# Patient Record
Sex: Female | Born: 1970 | Race: White | Hispanic: Yes | Marital: Married | Smoking: Never smoker
Health system: Southern US, Community
[De-identification: ages and names within clinical notes are randomized; demographics above are authoritative.]

## PROBLEM LIST (undated history)

## (undated) DIAGNOSIS — J349 Unspecified disorder of nose and nasal sinuses: Secondary | ICD-10-CM

## (undated) DIAGNOSIS — Z87440 Personal history of urinary (tract) infections: Secondary | ICD-10-CM

## (undated) DIAGNOSIS — F419 Anxiety disorder, unspecified: Secondary | ICD-10-CM

## (undated) DIAGNOSIS — R002 Palpitations: Secondary | ICD-10-CM

## (undated) DIAGNOSIS — E785 Hyperlipidemia, unspecified: Secondary | ICD-10-CM

## (undated) HISTORY — DX: Anxiety disorder, unspecified: F41.9

## (undated) HISTORY — PX: COLONOSCOPY: SHX174

## (undated) HISTORY — DX: Palpitations: R00.2

## (undated) HISTORY — DX: Unspecified disorder of nose and nasal sinuses: J34.9

## (undated) HISTORY — PX: SEPTOPLASTY: SUR1290

## (undated) HISTORY — DX: Personal history of urinary (tract) infections: Z87.440

## (undated) HISTORY — DX: Hyperlipidemia, unspecified: E78.5

---

## 2004-03-07 ENCOUNTER — Encounter: Payer: Self-pay | Admitting: Family Medicine

## 2005-02-27 ENCOUNTER — Ambulatory Visit: Payer: Self-pay | Admitting: Family Medicine

## 2005-04-10 ENCOUNTER — Ambulatory Visit: Payer: Self-pay | Admitting: Family Medicine

## 2005-04-24 ENCOUNTER — Ambulatory Visit: Payer: Self-pay | Admitting: Family Medicine

## 2006-03-13 DIAGNOSIS — D509 Iron deficiency anemia, unspecified: Secondary | ICD-10-CM | POA: Insufficient documentation

## 2006-04-19 ENCOUNTER — Telehealth: Payer: Self-pay | Admitting: Family Medicine

## 2006-04-19 ENCOUNTER — Ambulatory Visit: Payer: Self-pay | Admitting: Family Medicine

## 2006-04-20 ENCOUNTER — Telehealth (INDEPENDENT_AMBULATORY_CARE_PROVIDER_SITE_OTHER): Payer: Self-pay | Admitting: *Deleted

## 2006-05-03 ENCOUNTER — Encounter: Payer: Self-pay | Admitting: Family Medicine

## 2006-05-15 ENCOUNTER — Ambulatory Visit: Payer: Self-pay | Admitting: Family Medicine

## 2006-05-28 ENCOUNTER — Ambulatory Visit: Payer: Self-pay | Admitting: Family Medicine

## 2006-06-18 ENCOUNTER — Ambulatory Visit: Payer: Self-pay | Admitting: Family Medicine

## 2006-06-18 DIAGNOSIS — J029 Acute pharyngitis, unspecified: Secondary | ICD-10-CM | POA: Insufficient documentation

## 2006-06-18 DIAGNOSIS — H9209 Otalgia, unspecified ear: Secondary | ICD-10-CM | POA: Insufficient documentation

## 2008-09-18 ENCOUNTER — Ambulatory Visit (HOSPITAL_COMMUNITY): Payer: Self-pay | Admitting: Psychiatry

## 2008-10-01 ENCOUNTER — Ambulatory Visit (HOSPITAL_COMMUNITY): Payer: Self-pay | Admitting: Psychiatry

## 2008-10-19 ENCOUNTER — Ambulatory Visit (HOSPITAL_COMMUNITY): Payer: Self-pay | Admitting: Psychiatry

## 2008-10-30 ENCOUNTER — Ambulatory Visit (HOSPITAL_COMMUNITY): Payer: Self-pay | Admitting: Psychiatry

## 2008-11-26 ENCOUNTER — Ambulatory Visit (HOSPITAL_COMMUNITY): Payer: Self-pay | Admitting: Psychiatry

## 2008-12-11 ENCOUNTER — Ambulatory Visit (HOSPITAL_COMMUNITY): Payer: Self-pay | Admitting: Psychiatry

## 2009-01-11 ENCOUNTER — Ambulatory Visit (HOSPITAL_COMMUNITY): Payer: Self-pay | Admitting: Psychiatry

## 2009-01-20 ENCOUNTER — Ambulatory Visit (HOSPITAL_COMMUNITY): Payer: Self-pay | Admitting: Psychiatry

## 2009-01-20 ENCOUNTER — Ambulatory Visit: Payer: Self-pay | Admitting: Family Medicine

## 2009-02-11 ENCOUNTER — Ambulatory Visit (HOSPITAL_COMMUNITY): Payer: Self-pay | Admitting: Psychiatry

## 2009-02-26 ENCOUNTER — Ambulatory Visit (HOSPITAL_COMMUNITY): Payer: Self-pay | Admitting: Psychiatry

## 2011-08-11 DIAGNOSIS — M25559 Pain in unspecified hip: Secondary | ICD-10-CM | POA: Insufficient documentation

## 2011-12-22 ENCOUNTER — Ambulatory Visit (INDEPENDENT_AMBULATORY_CARE_PROVIDER_SITE_OTHER): Payer: 59 | Admitting: Licensed Clinical Social Worker

## 2011-12-22 DIAGNOSIS — F411 Generalized anxiety disorder: Secondary | ICD-10-CM

## 2011-12-22 DIAGNOSIS — F419 Anxiety disorder, unspecified: Secondary | ICD-10-CM

## 2011-12-24 ENCOUNTER — Encounter (HOSPITAL_COMMUNITY): Payer: Self-pay | Admitting: Licensed Clinical Social Worker

## 2011-12-24 DIAGNOSIS — F411 Generalized anxiety disorder: Secondary | ICD-10-CM | POA: Insufficient documentation

## 2011-12-24 NOTE — Progress Notes (Signed)
Presenting Problem Chief Complaint: Patty [name] came in after three years because of anxiety and unwarranted jealousy with her husband.  She has been taking Zoloft since 2010 and she has not had the anxiety that she used to have.  She slowly weaned herself off the medication in January and now the jealousy is coming back. She does not want to see herself as having to need medication.  Her husband has to take medication but that "is different"  Discussed how anxiety may not always be from situational problems - it is a biological problem and the biology is helped with medication.  She agreed to see the psychiatrist here and restart her medication.    She has a history that feeds into this way of her anxiety to be played out.  Her father had affairs and her mother chose not to pay attention.  It was Latice who was confronting him like a wife.  She was angry with her mother for not doing anything about it and angry with her father for doing it.  Discussed the issues to do with her culture.  It was more acceptable for men to have affairs - there was also more intensity and jealousy expressed in romantic relationships  She agreed with those two things.  So what she has learned can be unlearned.  She is very upset about it for her husband loves her but he is getting tired of the checking and accusations.  He is beginning to feel the need for space.  He is not in therapy now as he was three years ago - he has been doing well.  Did not ask about the couples therapy but they are not going at this time.  She learned more about having leisure time and they are both into exercise - some together and some apart.  She has a running group and he has a cycling group.  They have moved to [place] since we met before and they like it there.  It is closer to work and the [employer] school which is a Psychologist, educational school and she is happy with her children being there.  She has flexibility in her job so she can work at home so she also can  visit the school a lot too.  She is tired of her irrationality and all of her thoughts.  Discussed keeping a journal and writing them down instead of discussing with her husband.  She does not want to ruin their relationship for they are best friends and talk about everything.  She is happy with her marriage and does not like checking his phone and reading the wrong things into everything.  She has not cause to be concerned - when she was in treatment three years ago there was an incident where her husband kissed another woman but she did not mean anything to him and nothing was started.    She is wondering about her self esteem that she does not have confidence that she could be the only one for him.  One woman was not enough for her father.  Is she transferring her feelings from her father to her husband?  She is trying to vent to her brother instead of talking to him about it for now.  She hates the feeling of doubting all of the time.   What are the main stressors in your life right now, how long? Anxiety   1, Mood Swings  1 and Irritability   1   Previous mental  health services Have you ever been treated for a mental health problem, when, where, by whom? Yes  Three years ago with this same counselor Also when she left individual counseling, she was in marital therapy  Are you currently seeing a therapist or counselor, counselor's name? No   Have you ever had a mental health hospitalization, how many times, length of stay? No   Have you ever been treated with medication, name, reason, response? Yes  She recently took herself off of Zoloft - she was taking 100 mg  Have you ever had suicidal thoughts or attempted suicide, when, how? No   Risk factors for Suicide Demographic factors:   Current mental status:  Loss factors:  Historical factors:  Risk Reduction factors: Responsible for children under 74 years of age, Sense of responsibility to family, Religious beliefs about death, Employed,  Living with another person, especially a relative, Positive social support, Positive therapeutic relationship and Positive coping skills or problem solving skills Clinical factors:   Cognitive features that contribute to risk:    SUICIDE RISK:  Minimal: No identifiable suicidal ideation.  Patients presenting with no risk factors but with morbid ruminations; may be classified as minimal risk based on the severity of the depressive symptoms  Medical history Medical treatment and/or problems, explain: No  Do you have any issues with chronic pain?  No  Name of primary care physician/last physical exam: ?  Allergies: No Medication, reactions?    Current medications: None Prescribed by: ? Is there any history of mental health problems or substance abuse in your family, whom? No  Has anyone in your family been hospitalized, who, where, length of stay? No   Social/family history Have you been married, how many times?  Only once - approximately married 9/10 years  Do you have children?  She has two children - Sophia age 42 and [place] age 42  How many pregnancies have you had?  two  Who lives in your current household? Husband and two children  Military history: No   Religious/spiritual involvement:  What religion/faith base are you? Catholic  Family of origin (childhood history)  Where were you born? [place] Where did you grow up? [place] How many different homes have you lived?  Describe the atmosphere of the household where you grew up:  Do you have siblings, step/half siblings, list names, relation, sex, age? Yes  One brother - younger who is an Art gallery manager  Are your parents separated/divorced, when and why? No   Are your parents alive? Yes  They live in [place]  Social supports (personal and professional): Husband, friends and family but they are not local  Her family live in [place] and her husband's parents are both dead and any other family live in the [place]  area/  Education How many grades have you completed? post college graduate work or degree Did you have any problems in school, what type? No  Medications prescribed for these problems? 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 No   Have you ever been exposed to something traumatic, describe? No   Substance use Do you use Caffeine? Yes Type, frequency? coffee  Do you use Nicotine? No Type, frequency, ppd?    Do you use Alcohol? Yes  Type, frequency? Socially - beer/wine  How old were you went you first tasted alcohol? ? Was this accepted by your family?   When was your last drink, type, how much?   Have you ever used illicit drugs or taken more than prescribed, type, frequency, date of last usage? No   Mental Status: General Appearance Luretha Murphy:  Neat Eye Contact:  Good Motor Behavior:  Normal Speech:  Normal, Pressured and Rate:fast - has a mild [place] Rican accent Level of Consciousness:  Alert Mood:  Anxious and Euthymic Affect:  Appropriate Anxiety Level:  Minimal Thought Process:  Coherent, Relevant and Intact Thought Content:  WNL Perception:  Normal Judgment:  Good Insight:  Present Cognition:  Concentration Yes  Diagnosis AXIS I Anxiety Disorder NOS  AXIS II Deferred  AXIS III Past Medical History  Diagnosis Date  . Anxiety     AXIS IV problems with primary support group  AXIS V 61-70 mild symptoms   Plan: Referred to psychiatrist for medication, meet for six weeks on a weekly basis and develop treatment plan  _________________________________________           Merlene Morse, LCSW / Date  12/22/11

## 2011-12-25 ENCOUNTER — Ambulatory Visit (INDEPENDENT_AMBULATORY_CARE_PROVIDER_SITE_OTHER): Payer: 59 | Admitting: Psychiatry

## 2011-12-25 ENCOUNTER — Encounter (HOSPITAL_COMMUNITY): Payer: Self-pay | Admitting: Psychiatry

## 2011-12-25 VITALS — BP 115/67 | HR 60 | Ht 66.5 in | Wt 126.0 lb

## 2011-12-25 DIAGNOSIS — F411 Generalized anxiety disorder: Secondary | ICD-10-CM

## 2011-12-25 MED ORDER — SERTRALINE HCL 50 MG PO TABS: 50.0000 mg | ORAL_TABLET | Freq: Every day | ORAL | Status: DC

## 2011-12-25 NOTE — Progress Notes (Signed)
Psychiatric Assessment Adult  Patient Identification:  Patty [name] Date of Evaluation:  12/25/2011 Chief Complaint: Chief Complaint  Patient presents with  . Anxiety  . Depression   History of Chief Complaint:   HPI Comments: Ms. Hughley  is a 41  Y/o female with a past psychiatric history significant for Adjustment disorder with mixed anxiety and depression. The patient is referred for psychiatric services for psychiatric evaluation and medication.   The patient reports that her main stressors are: her irrational thoughts which lead to jealousy and loss of self-esteem.    In the area of affective symptoms, patient appears euthymic. Patient denies current suicidal ideation, intent, or plan. Patient denies current homicidal ideation, intent, or plan. Patient denies auditory hallucinations. Patient denies visual hallucinations. Patient denies symptoms of paranoia. Patient states sleep is poor, with approximately 8 hours of broken sleep per night. Appetite is good. Energy level is good. Patient denies symptoms of anhedonia. Patient endorses/denies hopelessness, helplessness, or guilt.   Denies any recent episodes consistent with mania, particularly decreased need for sleep with increased energy, grandiosity, impulsivity, hyperverbal and pressured speech, or increased productivity. Denies any recent symptoms consistent with psychosis, particularly auditory or visual hallucinations, thought broadcasting/insertion/withdrawal, or ideas of reference. Also denies excessive worry to the point of physical symptoms as well as any panic attacks. Denies any history of trauma or symptoms consistent with PTSD such as flashbacks, nightmares, hypervigilance, feelings of numbness or inability to connect with others.   She reports that she has always having obsessive thoughts about her father cheating on her mother since she was about 43 years old.  She reports that she had another relationship in  college in which she had ended due to issues of mistrust. She reports that she has had the need to leave work for 6 months, after her husband cheated on her, then started sertraline and within 5 months, with medication and therapy she stopped having to check your phone.  She states that her husband has been becoming distant and the she had a resurfacing of feeling of mistrust to the point of having to leaving work once a week ago.  Review of Systems  Constitutional: Negative.   Respiratory: Negative.   Cardiovascular: Negative.   Gastrointestinal: Negative.     Filed Vitals:   12/25/11 1316  BP: 115/67  Pulse: 60  Height: 5' 6.5" (1.689 m)  Weight: 126 lb (57.153 kg)   Physical Exam  Vitals reviewed. Constitutional: She appears well-developed and well-nourished. No distress.  Skin: She is not diaphoretic.   Traumatic Brain Injury: No   Past Psychiatric History: Diagnosis: Adjustment disorder with mixed anxiety and depression  Hospitalizations: Patient denies.  Outpatient Care: Currently in therapy. First at age 73  Substance Abuse Care: Patient denies.  Self-Mutilation:Patient denies.  Suicidal Attempts: Patient denies.  Violent Behaviors: Patient denies.   Past Medical History:   Past Medical History  Diagnosis Date  . Anxiety    History of Loss of Consciousness:  No Seizure History:  No Cardiac History:  No Allergies:  No Known Allergies Current Medications:  No current outpatient prescriptions on file.   Previous Psychotropic Medications:  SUBSTANCE USE HISTORY:  Caffeine: Coffee 2 cups per day.  Nicotine: Patient denies.  Alcohol: Patient denies.  Illicit Drugs: Patient denies.   Blackouts:  No  Social History: Current Place of Residence: [place], [place] Place of Birth: [place], [place] Family Members: She lives with her husband an 2 daughters. Her younger brother and parents are  still living in [place] Marital Status:  Married Children: 2  Daughters:  Ages 8 and 5  Relationships: She reports that her family is her main source of emotional support. Education:  Graduate-Master's degree Educational Problems/Performance: Some test anxiety with.. Religious Beliefs/Practices: Catholic History of Abuse: none Occupational Experiences; Military History:  None. Legal History: none Hobbies/Interests: Running. Shopping.  Family History:  History reviewed. No pertinent family history.  Mental Status Examination/Evaluation: Objective:  Appearance: Casual  Eye Contact::  Good  Speech:  Clear and Coherent and Normal Rate  Volume:  Normal  Mood:  "okay-good"  Affect:  Appropriate, Congruent and Full Range  Thought Process:  Coherent, Goal Directed, Linear and Logical  Orientation:  Full  Thought Content:  WDL  Suicidal Thoughts:  No  Homicidal Thoughts:  No  Judgement:  Good  Insight:  Good  Psychomotor Activity:  Normal  Akathisia:  No  Handed:  Right  Memory: 3/3 Immediate; 2/3 Recent.  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 Anxiety      AXIS IV other psychosocial or environmental problems  AXIS V 51-60 moderate symptoms   Treatment Plan/Recommendations:  1. Affirm with the patient that the medications are taken as ordered. Patient expressed understanding of how their medications were to be used.  2. Continue the following psychiatric medications as written prior to this appointment with the following changes:  a) Sertraline 50 mg, Take one tablet to 2 weeks, then one-and half 75 mg for one  week, then increase to two tablets. The patient reports that she did best on 100 mg daily. 3. Therapy: brief supportive therapy provided. Continue current services.  4. Risks and benefits, side effects and alternatives discussed with patient, he/she was given an opportunity to  ask questions about his/her medication, illness, and treatment. All current psychiatric  medications have been reviewed and discussed with the patient and adjusted as clinically appropriate. The patient has been provided an accurate and updated list of the medications being now prescribed.  5. Patient told to call clinic if any problems occur. Patient advised to go to  ER  if s/he should develop SI/HI, side effects, or if symptoms worsen. Has crisis numbers to call if needed.   6. No labs warranted at this time.   7. The patient was encouraged to keep all PCP and specialty clinic appointments.  8. Patient was instructed to return to clinic in 1 month.  9. The patient was advised to call and cancel their mental health appointment within 24 hours of the appointment, if they are unable to keep the appointment.  10. The patient expressed understanding of the plan and agrees with the above.   Jacqulyn Cane, MD 7/22/20131:06 PM

## 2011-12-27 ENCOUNTER — Ambulatory Visit (INDEPENDENT_AMBULATORY_CARE_PROVIDER_SITE_OTHER): Payer: 59 | Admitting: Licensed Clinical Social Worker

## 2011-12-27 DIAGNOSIS — F411 Generalized anxiety disorder: Secondary | ICD-10-CM

## 2011-12-27 NOTE — Progress Notes (Signed)
   THERAPIST PROGRESS NOTE  Session Time: 8:00 - 8:50  Participation Level: Active  Behavioral Response: NeatAlertAnxious and Euthymic  Type of Therapy: Individual Therapy  Treatment Goals addressed: Anxiety  Interventions: Motivational Interviewing and Supportive  Summary: Patty [name] is a 41 y.o. female who presents with anxiety that manifests in unwarranted jealousy.  [name] reports that she saw Dr. Laury Deep and she is restarted on Zoloft starting at 50mg  going to 75 and the final dose with be 100mg .  They discussed how the medication works and he reminded her that the brain is an organ that sometimes needs treatment.  She has an ingrained feeling about taking medication and seeing a therapist from her culture.  She knows differently but those well taught myths are hard to totally get rid of.   Discussed her history with learning about jealousy in the family.  She was close to her grandmother and she was always yelling at her husband about other women - he ignored her and they were married for 40 years.  Her grandmother's history is that she was married to a man that she had one child with - she left him and met the father of [name] - they wanted to marry but her father did not like him so that relationship ended - then she went back to her first husband and had another child.  They ended up divorced and she married the man that she was with for 40 years and the only grandfather that [name] knew.  Her husband has 2 half siblings and no whole sibling and he does not know his father.  When [name] was about 60-55 years old she started giving her father a bad time about being with other women when he was not home.  There is no evidence of this.  They did go to parties but mother did not want to go - father took his children.  So she observed him dancing with other women having a good time.  Her parents never divorced and apparently mother trusted him or did not care.  [name] thought she was  taking care of her mother.  She had the feeling that she was controlling her father. She knows she was not..   Suicidal/Homicidal: Nowithout intent/plan  Plan: Return again in 1 weeks.  Diagnosis: Axis I: Anxiety Disorder NOS    Axis II: Deferred    [name],[name], LCSW 12/27/2011

## 2012-01-02 ENCOUNTER — Ambulatory Visit (INDEPENDENT_AMBULATORY_CARE_PROVIDER_SITE_OTHER): Payer: 59 | Admitting: Licensed Clinical Social Worker

## 2012-01-02 DIAGNOSIS — F411 Generalized anxiety disorder: Secondary | ICD-10-CM

## 2012-01-02 NOTE — Progress Notes (Signed)
   THERAPIST PROGRESS NOTE  Session Time: 8:05 - 8:55  Participation Level: Active  Behavioral Response: CasualAlertEuthymic  Type of Therapy: Individual Therapy  Treatment Goals addressed: Anxiety  Interventions: Motivational Interviewing and Supportive  Summary: RENISHA COCKRUM is a 41 y.o. female who presents with problems with anxiety.  Ereka reports that she is feeling a lot better.  She feels the medicine works.  She still struggles some with the idea that she has to take medication.  She is struggling with her knowledge versus her culture.  Her culture is judgmental about mental health issues and medication - there a lot of myths connected to it.  She does not want her family to know that she is back on medication.  Her father never did know - and of course he is probably the one who could use the medication.  He has anxiety he would never admit to.    Judye realizes she also learned the possessive jealous ways from her grandmother and her father for they were both possessive and controlling.  So she comes by it hoenstyl.  She had a great weekend with her husband and they had a good talk about the problem. She feels foolish but she has finally come to realize that she cannot control her husband.   All the checking and watching was not going to do anything but keep her upset and tension in her realtionship.  -   Suicidal/Homicidal: Nowithout intent/plan  Plan: Return again in 1 weeks.  Diagnosis: Axis I: Anxiety Disorder NOS    Axis II: Deferred    Kechia Yahnke,[name], LCSW 01/02/2012

## 2012-01-17 ENCOUNTER — Ambulatory Visit (HOSPITAL_COMMUNITY): Payer: Self-pay | Admitting: Licensed Clinical Social Worker

## 2012-01-26 ENCOUNTER — Ambulatory Visit (HOSPITAL_COMMUNITY): Payer: Self-pay | Admitting: Licensed Clinical Social Worker

## 2012-01-30 ENCOUNTER — Ambulatory Visit (HOSPITAL_COMMUNITY): Payer: Self-pay | Admitting: Licensed Clinical Social Worker

## 2012-01-31 ENCOUNTER — Ambulatory Visit (INDEPENDENT_AMBULATORY_CARE_PROVIDER_SITE_OTHER): Payer: 59 | Admitting: Psychiatry

## 2012-01-31 ENCOUNTER — Encounter (HOSPITAL_COMMUNITY): Payer: Self-pay | Admitting: Psychiatry

## 2012-01-31 VITALS — BP 121/64 | HR 55 | Ht 66.5 in | Wt 126.0 lb

## 2012-01-31 DIAGNOSIS — F411 Generalized anxiety disorder: Secondary | ICD-10-CM

## 2012-01-31 MED ORDER — SERTRALINE HCL 100 MG PO TABS: 100.0000 mg | ORAL_TABLET | Freq: Every day | ORAL | Status: DC

## 2012-01-31 NOTE — Progress Notes (Signed)
[facility]al [facility] Follow-up Outpatient Visit  Patty [name] [birth year]  Date: 01/31/2012  History of Chief Complaint:  HPI Comments: Ms. Harron is a 41 Y/o female with a past psychiatric history significant for Adjustment disorder with mixed anxiety and depression. The patient is referred for psychiatric services for medication evaluation.  She reports that she noticed an improvement with increase dosage of sertraline to 100 mg.   In the area of affective symptoms, patient appears euthymic. Patient denies current suicidal ideation, intent, or plan. Patient denies current homicidal ideation, intent, or plan. Patient denies auditory hallucinations. Patient denies visual hallucinations. Patient denies symptoms of paranoia. Patient states sleep is good, with approximately 8 hours. Appetite is good. Energy level is good. Patient denies symptoms of anhedonia. Patient denies hopelessness, helplessness, or guilt.   Denies any recent episodes consistent with mania, particularly decreased need for sleep with increased energy, grandiosity, impulsivity, hyperverbal and pressured speech, or increased productivity. Denies any recent symptoms consistent with psychosis, particularly auditory or visual hallucinations, thought broadcasting/insertion/withdrawal, or ideas of reference. Also denies excessive worry to the point of physical symptoms as well as any panic attacks. Denies any history of trauma or symptoms consistent with PTSD such as flashbacks, nightmares, hypervigilance, feelings of numbness or inability to connect with others.   Review of Systems  Constitutional: Negative.  Respiratory: Negative.  Cardiovascular: Negative.  Gastrointestinal: Negative.   Filed Vitals:   01/31/12 1309  BP: 121/64  Pulse: 55  Height: 5' 6.5" (1.689 m)  Weight: 126 lb (57.153 kg)    Physical Exam  Vitals reviewed.  Constitutional: She appears well-developed and well-nourished. No distress.    Skin: She is not diaphoretic.   Traumatic Brain Injury: No   Past Psychiatric History:  Reviewed Diagnosis: Adjustment disorder with mixed anxiety and depression   Hospitalizations: Patient denies.   Outpatient Care: Currently in therapy. First at age 61   Substance Abuse Care: Patient denies.   Self-Mutilation:Patient denies.   Suicidal Attempts: Patient denies.   Violent Behaviors: Patient denies.    Past Medical History:  Reviewed Past Medical History   Diagnosis  Date   .  Anxiety     History of Loss of Consciousness: No  Seizure History: No  Cardiac History: No  Allergies: No Known Allergies   Current Medications:  Current Outpatient Prescriptions on File Prior to Visit  Medication Sig Dispense Refill  . sertraline (ZOLOFT) 100 MG tablet Take 1 tablet (100 mg total) by mouth daily.  30 tablet  2    Previous Psychotropic Medications:  SUBSTANCE USE HISTORY:  Caffeine: Coffee 2 cups per day.  Nicotine: Patient denies.  Alcohol: Patient denies.  Illicit Drugs: Patient denies.   Blackouts: No   Social History:  Reviewed Current Place of Residence: [place], [place]  Place of Birth: [place], [place]  Family Members: She lives with her husband an 2 daughters. Her younger brother and parents are still living in [place]  Marital Status: Married  Children: 2  Daughters: Ages 8 and 5  Relationships: She reports that her family is her main source of emotional support.  Education: Graduate-Master's degree  Educational Problems/Performance: Some test anxiety with..  Religious Beliefs/Practices: Catholic  History of Abuse: none  Occupational Experiences;  Military History: None.  Legal History: none  Hobbies/Interests: Running. Shopping.   Family History: History reviewed. No pertinent family history.   Mental Status Examination/Evaluation:  Objective: Appearance: Casual   Eye Contact:: Good   Speech: Clear and Coherent and Normal  Rate   Volume: Normal   Mood:  "good"   Affect: Appropriate, Congruent and Full Range   Thought Process: Coherent, Goal Directed, Linear and Logical   Orientation: Full   Thought Content: WDL   Suicidal Thoughts: No   Homicidal Thoughts: No   Judgement: Good   Insight: Good   Psychomotor Activity: Normal   Akathisia: No   Handed: Right   Memory: 3/3 Immediate; 3/3 Recent.   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  Anxiety       AXIS IV  other psychosocial or environmental problems   AXIS V  51-60 moderate symptoms    Treatment Plan/Recommendations:  1. Affirm with the patient that the medications are taken as ordered. Patient expressed understanding of how their medications were to be used.  2. Continue the following psychiatric medications as written prior to this appointment with the following changes:  a) Continue Sertraline 100 mg  3. Therapy: brief supportive therapy provided. Continue current services.  4. Risks and benefits, side effects and alternatives discussed with patient, she was given an opportunity to ask questions about her medication, illness, and treatment. All current psychiatric  medications have been reviewed and discussed with the patient and adjusted as clinically appropriate. The patient has been provided an accurate and updated list of the medications being now prescribed.  5. Patient told to call clinic if any problems occur. Patient advised to go to ER if she should develop SI/HI, side effects, or if symptoms worsen. Has crisis  numbers to call if needed.  6. No labs warranted at this time.  7. The patient was encouraged to keep all PCP and specialty clinic appointments.  8. Patient was instructed to return to clinic in 2 months.  9. The patient was advised to call and cancel their mental health appointment within 24 hours of the appointment, if they are unable to keep the appointment.  10. The patient expressed understanding of the plan and agrees with the above.   Jacqulyn Cane, MD

## 2012-02-02 ENCOUNTER — Ambulatory Visit (HOSPITAL_COMMUNITY): Payer: Self-pay | Admitting: Licensed Clinical Social Worker

## 2012-02-06 ENCOUNTER — Ambulatory Visit: Payer: Self-pay | Admitting: Sports Medicine

## 2012-02-07 ENCOUNTER — Encounter: Payer: Self-pay | Admitting: Sports Medicine

## 2012-02-07 ENCOUNTER — Ambulatory Visit (INDEPENDENT_AMBULATORY_CARE_PROVIDER_SITE_OTHER): Payer: 59 | Admitting: Sports Medicine

## 2012-02-07 VITALS — BP 108/70 | Ht 67.0 in | Wt 121.0 lb

## 2012-02-07 DIAGNOSIS — M25569 Pain in unspecified knee: Secondary | ICD-10-CM

## 2012-02-07 DIAGNOSIS — M7630 Iliotibial band syndrome, unspecified leg: Secondary | ICD-10-CM

## 2012-02-07 DIAGNOSIS — M629 Disorder of muscle, unspecified: Secondary | ICD-10-CM

## 2012-02-07 MED ORDER — KETOPROFEN POWD: Status: DC

## 2012-02-07 NOTE — Progress Notes (Signed)
  Subjective:    Patient ID: Patty [name], female    DOB: 1970-08-25, 41 y.o.   MRN: [id number]  HPI  Patient is a 41 y/o female runner coming in for right lateral knee pain x 1 month. Patient states that her right lateral leg pain first started last march when she was doing a lot of spinning workouts. She felt pain and like something broke on the lateral aspect of her hip. This caused her to take 1.5 months off to rest. Patient has now returned to activity and is training for a 1/2 marathon in October. She has had the right lateral knee pain now for one month. Patient notes that the pain started when she increased her mileage and also changed her shoes. Now, she does fine at the beginning of her run but always has onset of sharp pain 4.5 miles into the run.  Pain is 7-8/10. She has tried 3 pairs of shoes, visiting a PT last week for stretching exercises, massage therapy, ibuprofen 800 mg tid, and using a foam roller. Patient has not seen any improvement in her symptoms.  Review of Systems     Objective:   Physical Exam  Gen: A&O, well appearing, well groomed HEENT: sclera nonicteric, conjunctive non injected Resp: regular respirations with no increased WOB MSK: right knee: mild tenderness to palpation over the lateral distal IT band near the joint line. No joint line tenderness or other TTP. Full knee extension and flexion. No effusion. Neg lachman's. Neg varus and valgus strain. Neg McMurrays. Hip: Full hip ROM. 4+/5 strength on hip abduction R>L.  Slightly decreased hip ROM on internal rotation of R compared to left. No TTP of the IT band as it runs along the thigh, also no proximal TTP at the hip. Gait: Patient noted to have slight medial deviation of the patella while walking and running. Also slight out toeing of right foot.     Assessment & Plan:  #1. IT Band Syndrome - likely due to combination of hip abductor weakness and and quadricep weakness leading to medial deviation of  patella.  - ketoprofen 20% compound gel to lateral knee tid  - arch supports with scaphoid pad  - hip abduction and quad strengthening exercise.  - IT stretch 30 sec x 5  - Continue ice after run  - Foam roller Patient is okay to continue training for her half marathon as symptoms allow but understands that she may have to alter her training plan somewhat based on her knee pain. If symptoms were to persist, we could consider a cortisone injection, but I would want her to avoid running for at least a week thereafter. Followup when necessary

## 2012-02-07 NOTE — Patient Instructions (Signed)
IT Band Syndrome  1. Apply ketoprofen compound 3 times per day 2. Use arch supports 3. Do stretching and strengthening exercises as directed. Hip abduction 3 x 15 each side and leg lifts as directed on sheet. 4. Do IT stretch 30 sec x 5 times 5. Continue foam roller 6. Followup one month if continued symptoms.

## 2012-02-08 ENCOUNTER — Ambulatory Visit (INDEPENDENT_AMBULATORY_CARE_PROVIDER_SITE_OTHER): Payer: 59 | Admitting: Licensed Clinical Social Worker

## 2012-02-08 DIAGNOSIS — F411 Generalized anxiety disorder: Secondary | ICD-10-CM

## 2012-02-08 NOTE — Progress Notes (Signed)
   THERAPIST PROGRESS NOTE  Session Time: 9:05 - 10:00  Participation Level: Active  Behavioral Response: CasualAlertDysphoric  Type of Therapy: Individual Therapy  Treatment Goals addressed: Anxiety and Coping  Interventions: Motivational Interviewing and Supportive  Summary: Patty [name] is a 41 y.o. female who presents with anxiety.  [name] was teary and upset today for her husband is tired of her jealousy - actually so is she.  She had taken girls to the beach because her husband went to a bachelor party out of town.  Before he got back she had this urge to check his e-mail.  She found a picture of a dish.  When he got home she asked him about it.  He told her that he and a girl at work were in line in [employer] and they were joking around at the food.  The picture was to prove he could cook better than the cafeteria food.  He did not tell her because he does not want to provoke a reaction from her.  There apparently another time when he was racing with his bike and a girl was also riding and they decided to race.  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  She feels that she is more upset that he lies by omission.  He is tired of her possessiveness - he feels that he does not know how to help her trust.  He also feels that men can have female friends.Marland Kitchen He is always flirtatious - which can be annoying.  She feels she can manage without him that she does not need him = commented that was an interesting statement.  Does he feel not important enough to you.  She may have kept herself somewhat distant because of what happened when he went to [place] where he did almost leave her for another woman.   The question was asked about whether they would consider couples therapy.  Apparently they did not go to therapist recommended to them from me - the ones that do [facility].  She stated that he had picked out the therapist and  she was not helpful.  She perked up at the idea of going to someone who does a different type of therapy. Discussed how they have been together for a long time and they have children - it would be worth it to do the work on their marriage - if they don't clearly understand the problems in their marriage they risk picking the same problem again. There does seem to be an issue with her husband since there is a need to get female attention. Which fits her issue with not trusting men with other women - this of course goes back to her father. -She is going to pursue the other therapy recommended. .  .   Suicidal/Homicidal: Nowithout intent/plan  Plan: Return again in 1 weeks.  Diagnosis: Axis I: Anxiety Disorder NOS    Axis II: Deferred    Hartley Wyke,[name], LCSW 02/08/2012

## 2012-02-09 ENCOUNTER — Ambulatory Visit (HOSPITAL_COMMUNITY): Payer: Self-pay | Admitting: Licensed Clinical Social Worker

## 2012-02-13 ENCOUNTER — Ambulatory Visit: Payer: Self-pay | Admitting: Sports Medicine

## 2012-02-15 ENCOUNTER — Ambulatory Visit (INDEPENDENT_AMBULATORY_CARE_PROVIDER_SITE_OTHER): Payer: 59 | Admitting: Licensed Clinical Social Worker

## 2012-02-15 DIAGNOSIS — F411 Generalized anxiety disorder: Secondary | ICD-10-CM

## 2012-02-15 NOTE — Progress Notes (Signed)
   THERAPIST PROGRESS NOTE  Session Time: 9:10 -:00  Participation Level: Active  Behavioral Response: CasualAlertEuthymic  Type of Therapy: Individual Therapy  Treatment Goals addressed: Anxiety  Interventions: Motivational Interviewing and Supportive  Summary: Patty [name] is [name] 41 y.o. female who presents with anxiety. Patty [name] repotted that they had gone to the [employer] therapist and liked her - husband liked her too. Brytnie has read some information about [facility] and she likes the theory behind it.  The therapist gave them some reading to do and one was the book I had leant to her.  She has hope that this therapy may help them.  The therapist asked how long  He had been unhappy and his answer was 5 years which concerns Patty [name]. They have had very different family histories.  Patty [name] was basically stable - some affair her father may have had but he was always involved with the children. Mother was [name] little more distant - involved with the church.  His history is [name] lot worse his mother was [name] Mudlogger and would not settle down and his father deserted.  He often did not have enough food.  She taught her kids how to steal.  What concerns her is his need to be flirtatious.  It is only inviting the wrong attention.  He has always been flirtations. He is more outgoing than she is...     Suicidal/Homicidal: Nowithout intent/plan  Plan: Return again in 1 weeks.  Diagnosis: Axis I: Anxiety Disorder NOS    Axis II: Deferred    Trigger Patty [name],Patty A, LCSW 02/15/2012

## 2012-02-20 ENCOUNTER — Ambulatory Visit (INDEPENDENT_AMBULATORY_CARE_PROVIDER_SITE_OTHER): Payer: 59 | Admitting: Sports Medicine

## 2012-02-20 ENCOUNTER — Encounter: Payer: Self-pay | Admitting: Sports Medicine

## 2012-02-20 VITALS — BP 107/68 | HR 67 | Ht 67.0 in | Wt 121.0 lb

## 2012-02-20 DIAGNOSIS — M629 Disorder of muscle, unspecified: Secondary | ICD-10-CM

## 2012-02-20 DIAGNOSIS — M7630 Iliotibial band syndrome, unspecified leg: Secondary | ICD-10-CM

## 2012-02-20 NOTE — Progress Notes (Signed)
  Subjective:    Patient ID: Patty [name], female    DOB: 08/30/1970, 41 y.o.   MRN: [id number]  HPI  Patient comes in today with persistent lateral right knee pain. She's currently training for half marathon in October. The topical ketoprofen is not helpful. She has been diligent about doing her hip abductor strengthening exercises. The green sports insoles and scaphoid pads are comfortable. She took a few days off from running and her pain did improve but return immediately with resumption of activity. She notes lateral right knee pain at around the 2 mile mark. She's able to run about 4 miles but has to stop due to her pain. No swelling. No mechanical symptoms.    Review of Systems     Objective:   Physical Exam Well-developed, well-nourished. No acute distress awake alert oriented x3.  Right knee: Full motion without an effusion. Discrete tenderness to palpation along the IT band over the lateral femoral condyle. No soft tissue swelling. No joint line tenderness. Negative McMurrays. Neurovascularly intact distally. Walking without a limp.       Assessment & Plan:  1. Persistent right knee pain secondary to distal IT band syndrome  We discussed the possibility of a cortisone injection but I informed the patient that she would need to avoid running for 10 days post injection. She was not interested in this. She would instead like to try some formal physical therapy for iontophoresis. She will continue with her hip abductor strengthening. She return to the office in 4 weeks for a reevaluation. We will plan on making her some custom orthotics at that visit. With her symptoms I do not believe she will be able to train appropriately for the upcoming half marathon in October. She is fine with modifying her running program and sticking with shorter runs of 3-4 miles and tell her knee pain improves. I explained to her the importance of hip abductor strengthening in the resolution of her pain  and prevention of recurrence. She understands.

## 2012-02-21 ENCOUNTER — Ambulatory Visit: Payer: Self-pay | Admitting: Sports Medicine

## 2012-02-22 ENCOUNTER — Ambulatory Visit (INDEPENDENT_AMBULATORY_CARE_PROVIDER_SITE_OTHER): Payer: 59 | Admitting: Licensed Clinical Social Worker

## 2012-02-22 DIAGNOSIS — F411 Generalized anxiety disorder: Secondary | ICD-10-CM

## 2012-02-25 NOTE — Progress Notes (Signed)
   THERAPIST PROGRESS NOTE  Session Time: 8:10 - 9:00  Participation Level: Active  Behavioral Response: CasualAlertEuthymic  Type of Therapy: Individual Therapy  Treatment Goals addressed: Anxiety  Interventions: Motivational Interviewing and Supportive  Summary: Patty [name] is a 41 y.o. female who presents with anxiety.   [name] was unhappy with herself because she had a slip with her questioning her husband.  It was an unfounded concern and her husband did say that he hated that behavior - it was the main thing that made him ever question anything about the marriage. She feels that out work together has helped her understand herself better.  She does nave th work to do to quit reacting..  She has set up a massage for her husband.  They have a vacation home and it is rented out until Nov.  She is going to miss going.  That is a place that they all can relax.  She has trouble relaxing at home.  The couple that her husband rides with ard becoming friends - the woman is excited that [name] is talking about going on a ride with her.  [name] knows that she is a better rider but it sounded like fun and she can get to know her. She and her husband went to a work party - celebration at her company.  She had a really good time - others were referring to she and her husband as honeymooners - the way they were together was nothing unusual for them.   Discussed how they have a lot of positive in their marriage.  Perhaps the new couples treatment will help them work out the main problem they have.- there is two sides to this issue - what is the reason that he needs to flirt.  Suicidal/Homicidal: Nowithout intent/plan  Plan: Return again in 1 weeks.  Diagnosis: Axis I: Anxiety Disorder NOS    Axis II: Deferred    Sameeha Rockefeller,[name], LCSW 02/25/2012

## 2012-02-29 ENCOUNTER — Ambulatory Visit (INDEPENDENT_AMBULATORY_CARE_PROVIDER_SITE_OTHER): Payer: 59 | Admitting: Licensed Clinical Social Worker

## 2012-02-29 DIAGNOSIS — F411 Generalized anxiety disorder: Secondary | ICD-10-CM

## 2012-02-29 NOTE — Progress Notes (Signed)
   THERAPIST PROGRESS NOTE  Session Time: 8:15 - 9:00  Participation Level: Active  Behavioral Response: NeatAlertDepressed  Type of Therapy: Individual Therapy  Treatment Goals addressed: Anxiety and Coping  Interventions: Motivational Interviewing and Supportive  Summary: Patty [name] is a 41 y.o. female who presents with anxiety.  Patty [name] reports that she is very upset about her problem of jealousy and possessiveness.  In exploring her problem it became obvious of how much [name] has trouble relaxing.  She has a hard time shutting down - even massage does not relax her.  She was sharing that she feels that the marital therapy is very intense and probably will be helpful.  There was an exercise that they were doing and she could not empty her mind and really hear what her husband was saying.  She was analyzing everything he said and did.  He was able to empty his mind - he is working with their therapy well.  The therapist  really pushed them to read the resources that she gave them..  She discussed her closeness to her father and how she tends to go to him with problems and not her mother. She does not talk about her mother and she says her memories of her are slim.  She is going to work on self talk and meditation.  Meditation will take awhile but she needs to make the time and effort available to her. She is aware that she and her husband are being good parents and both are trying to do better than their parents.  However, they do not get enough time together without children.  She is going to set that up better and leave the children with a babysitter..  Suicidal/Homicidal: Nowithout intent/plan  Plan: Return again in 1 weeks.  Diagnosis: Axis I: Anxiety Disorder NOS    Axis II: Deferred    Foch Rosenwald,[name], LCSW 02/29/2012

## 2012-03-06 ENCOUNTER — Ambulatory Visit (HOSPITAL_COMMUNITY): Payer: Self-pay | Admitting: Licensed Clinical Social Worker

## 2012-03-15 ENCOUNTER — Ambulatory Visit (INDEPENDENT_AMBULATORY_CARE_PROVIDER_SITE_OTHER): Payer: 59 | Admitting: Licensed Clinical Social Worker

## 2012-03-15 DIAGNOSIS — F411 Generalized anxiety disorder: Secondary | ICD-10-CM

## 2012-03-15 NOTE — Progress Notes (Signed)
   THERAPIST PROGRESS NOTE  Session Time: 8:00 - 8:50  Participation Level: Active  Behavioral Response: CasualAlertEuthymic  Type of Therapy: Individual Therapy  Treatment Goals addressed: Anxiety  Interventions: Motivational Interviewing and Supportive  Summary: Patty [name] is a 41 y.o. female who presents with anxiety.  [name] reporting that she went away to the beach for a business trip and she really got to relax. She found it to be a wonderful weekend.  She is realizing how having time to yourself is good and that is something her husband craves-  This from him has sometimes made her question how that is related to their marriage - she sees now it is a need they both have. He was pleased to hear that she was relaxed for he has complained about that with her.  She also realized a couple.needs to realized that they have different ways to express feelings and have different ways of feeling loved.  She sees her husband as cold and distant at times - pointed out to her that may be the way he gets space for himself.  He may be able to come toward her more if she can back up and not be so focussed on him.  If they are at a party she is busy watching him and what he does - inerpreting and questioning - and it actually bores her. Suggested she make it more interesting and pay attention to herself and get her needs met to meet new people, having different conversations.  They have a party this weekend, she is going to do that.  She never thought about that before.  She does the planning for the weekend because he is not a planner and he seems to enjoy what she plans.  She has gotten over her feelings of not wanting to leave the children. It helps that they have found a college student who they really like and trust.  The girls just love her.  That helps to have time to get away as a couple.   She is running daily and that really helps her feel good and keep her feelings in check.  She is also  riding a bike in between runs.  Her husband has his bike group that he really likes but now with weather changes they are not getting together as much.  He works out at [employer] and rides.  She is nervous about couple's therapy because she is having a hard time with the exercises.  They cancelled the one they had over the weekend.  Mainly because her husband was angry and did not want to go.  She is identifying the problem as hers and so is he - it will take awhile but his part will become more obvious.  Suicidal/Homicidal: Nowithout intent/plan  Plan: Return again in 2 weeks.  Diagnosis: Axis I: Generalized Anxiety Disorder    Axis II: Deferred    Klinton Candelas,[name], LCSW 03/15/2012

## 2012-03-19 ENCOUNTER — Ambulatory Visit (INDEPENDENT_AMBULATORY_CARE_PROVIDER_SITE_OTHER): Payer: 59 | Admitting: Sports Medicine

## 2012-03-19 VITALS — BP 107/70 | Ht 67.0 in | Wt 120.0 lb

## 2012-03-19 DIAGNOSIS — M7631 Iliotibial band syndrome, right leg: Secondary | ICD-10-CM

## 2012-03-19 DIAGNOSIS — M629 Disorder of muscle, unspecified: Secondary | ICD-10-CM

## 2012-03-19 NOTE — Progress Notes (Signed)
  Subjective:    Patient ID: Patty [name], female    DOB: 28-Aug-1970, 41 y.o.   MRN: [id number]  HPI Patient comes in today for orthotics and followup on right IT band syndrome. Right knee is feeling much better. She likes the body helix compression sleeve and is asking for a second one. She has been diligent about her home exercises, especially the hip abductor strengthening. She is down the green sports insoles and scaphoid pads to be quite helpful. She is back to running with minimal pain.   Review of Systems     Objective:   Physical Exam Well developed, well-nourished. No acute distress. Awake alert and oriented x3  Right knee shows full motion without effusion. No significant tennis along the distal IT band. Her hip abductor weakness has improved dramatically since her last office visit.      Assessment & Plan:  1. Improving right knee pain secondary to distal IT band syndrome  Patient is doing well. She understands the importance of continued hip abductor strengthening. We've given her a second body helix sleeve and we will make her some custom orthotics today. I'll see her back in 4 weeks.  Patient was fitted for a : standard, cushioned, semi-rigid orthotic. The orthotic was heated and afterward the patient stood on the orthotic blank positioned on the orthotic stand. The patient was positioned in subtalar neutral position and 10 degrees of ankle dorsiflexion in a weight bearing stance. After completion of molding, a stable base was applied to the orthotic blank. The blank was ground to a stable position for weight bearing. Size:9 Base: [employer] and Padding:none

## 2012-03-27 ENCOUNTER — Ambulatory Visit (HOSPITAL_COMMUNITY): Payer: Self-pay | Admitting: Licensed Clinical Social Worker

## 2012-04-02 ENCOUNTER — Ambulatory Visit (INDEPENDENT_AMBULATORY_CARE_PROVIDER_SITE_OTHER): Payer: 59 | Admitting: Psychiatry

## 2012-04-02 ENCOUNTER — Encounter (HOSPITAL_COMMUNITY): Payer: Self-pay | Admitting: Psychiatry

## 2012-04-02 VITALS — BP 121/62 | HR 63 | Ht 66.5 in | Wt 131.0 lb

## 2012-04-02 DIAGNOSIS — F411 Generalized anxiety disorder: Secondary | ICD-10-CM

## 2012-04-02 MED ORDER — SERTRALINE HCL 100 MG PO TABS: ORAL_TABLET | ORAL | Status: DC

## 2012-04-02 MED ORDER — SERTRALINE HCL 25 MG PO TABS: ORAL_TABLET | ORAL | Status: DC

## 2012-04-02 NOTE — Progress Notes (Signed)
[facility] Behavioral Health Follow-up Outpatient Visit  Patty [name] 07-16-1970  Date: 04/02/2012  History of Chief Complaint:  HPI Comments: Patty [name] is a 41 Y/o female with a past psychiatric history significant for Adjustment disorder with mixed anxiety and depression. The patient is referred for psychiatric services for medication evaluation.   The patient reports that she has triggers for OCD and has symptoms lasting for 3 days.  She states she will check her husbands phone for messages from other people. She continues to run and finds this an adequate coping mechanism for her obsessions.   In the area of affective symptoms, patient appears euthymic. Patient denies current suicidal ideation, intent, or plan. Patient denies current homicidal ideation, intent, or plan. Patient denies auditory hallucinations. Patient denies visual hallucinations. Patient denies symptoms of paranoia. Patient states sleep is good, with approximately 5.5  hours. Appetite is good. Energy level is good. Patient denies symptoms of anhedonia. Patient denies hopelessness, helplessness, or guilt.   Denies any recent episodes consistent with mania, particularly decreased need for sleep with increased energy, grandiosity, impulsivity, hyperverbal and pressured speech, or increased productivity. Denies any recent symptoms consistent with psychosis, particularly auditory or visual hallucinations, thought broadcasting/insertion/withdrawal, or ideas of reference. Also denies excessive worry to the point of physical symptoms as well as any panic attacks. Denies any history of trauma or symptoms consistent with PTSD such as flashbacks, nightmares, hypervigilance, feelings of numbness or inability to connect with others.   Review of Systems  Constitutional: Negative.  Respiratory: Negative.  Cardiovascular: Negative.  Gastrointestinal: Negative.   Filed Vitals:   04/02/12 1304  BP: 121/62  Pulse: 63  Height:  5' 6.5" (1.689 m)  Weight: 131 lb (59.421 kg)   Physical Exam  Vitals reviewed.  Constitutional: She appears well-developed and well-nourished. No distress.  Skin: She is not diaphoretic.   Traumatic Brain Injury: No  Past Psychiatric History: Reviewed  Diagnosis: Adjustment disorder with mixed anxiety and depression   Hospitalizations: Patient denies.   Outpatient Care: Currently in therapy. First at age 54   Substance Abuse Care: Patient denies.   Self-Mutilation:Patient denies.   Suicidal Attempts: Patient denies.   Violent Behaviors: Patient denies.    Past Medical History: Reviewed  Past Medical History   Diagnosis  Date   .  Anxiety     History of Loss of Consciousness: No  Seizure History: No  Cardiac History: No  Allergies: No Known Allergies   Current Medications:  Current Outpatient Prescriptions on File Prior to Visit  Medication Sig Dispense Refill  . Ketoprofen POWD Ketoprofen 20% gel aaa tid-qid  60 g  3  . sertraline (ZOLOFT) 100 MG tablet Take 1 tablet (100 mg total) by mouth daily.  30 tablet  2   Previous Psychotropic Medications:   SUBSTANCE USE HISTORY:  Caffeine: Coffee 2 cups per day.  Nicotine: Patient denies.  Alcohol: Patient denies.  Illicit Drugs: Patient denies.   Blackouts: No   Social History: Reviewed  Current Place of Residence: [place], [place]  Place of Birth: [place], [place]  Family Members: She lives with her husband an 2 daughters. Her younger brother and parents are still living in [place]  Marital Status: Married  Children: 2  Daughters: Ages 8 and 6  Relationships: She reports that her family is her main source of emotional support.  Education: Graduate-Master's degree  Educational Problems/Performance: Some test anxiety with..  Religious Beliefs/Practices: Catholic  History of Abuse: none  Occupational Experiences;  Hotel manager  History: None.  Legal History: none  Hobbies/Interests: Running. Shopping.   Family  History: History reviewed. No pertinent family history.   Mental Status Examination/Evaluation:  Objective: Appearance: Casual   Eye Contact:: Good   Speech: Clear and Coherent and Normal Rate   Volume: Normal   Mood: "okay"   Affect: Appropriate, Congruent and Full Range   Thought Process: Coherent, Goal Directed, Linear and Logical   Orientation: Full   Thought Content: Obsessions  Suicidal Thoughts: No   Homicidal Thoughts: No   Judgement: Good   Insight: Good   Psychomotor Activity: Normal   Akathisia: No   Handed: Right   Memory: 3/3 Immediate; 1/3 Recent.   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  Anxiety       AXIS IV  other psychosocial or environmental problems   AXIS V  51-60 moderate symptoms    Treatment Plan/Recommendations:  1. Affirm with the patient that the medications are taken as ordered. Patient expressed understanding of how their medications were to be used.  2. Continue the following psychiatric medications as written prior to this appointment with the following changes:  a) Increase Sertraline 125 mg  3. Therapy: brief supportive therapy provided. Continue current services.  4. Risks and benefits, side effects and alternatives discussed with patient, she was given an opportunity to ask questions about her medication, illness, and treatment. All current psychiatric  medications have been reviewed and discussed with the patient and adjusted as clinically appropriate. The patient has been provided  an accurate and updated list of the medications being now prescribed.  5. Patient told to call clinic if any problems occur. Patient advised to go to ER if she should develop SI/HI, side effects, or if symptoms worsen. Has crisis numbers to call if needed.  6. No labs warranted at this time.  7. The patient was encouraged to keep all PCP and specialty clinic appointments.  8. Patient was instructed to return to clinic in 2 months.  9. The patient was advised to call and cancel their mental health appointment within 24 hours of the appointment, if they are unable to keep the appointment.  10. The patient expressed understanding of the plan and agrees with the above.  Jacqulyn Cane, MD

## 2012-04-03 ENCOUNTER — Ambulatory Visit (HOSPITAL_COMMUNITY): Payer: Self-pay | Admitting: Licensed Clinical Social Worker

## 2012-04-10 ENCOUNTER — Ambulatory Visit (HOSPITAL_COMMUNITY): Payer: Self-pay | Admitting: Licensed Clinical Social Worker

## 2012-04-16 ENCOUNTER — Ambulatory Visit (HOSPITAL_COMMUNITY): Payer: Self-pay | Admitting: Licensed Clinical Social Worker

## 2012-04-17 ENCOUNTER — Ambulatory Visit (INDEPENDENT_AMBULATORY_CARE_PROVIDER_SITE_OTHER): Payer: 59 | Admitting: Sports Medicine

## 2012-04-17 ENCOUNTER — Encounter: Payer: Self-pay | Admitting: Sports Medicine

## 2012-04-17 VITALS — BP 113/71 | HR 55 | Ht 67.5 in | Wt 123.0 lb

## 2012-04-17 DIAGNOSIS — M7630 Iliotibial band syndrome, unspecified leg: Secondary | ICD-10-CM

## 2012-04-17 DIAGNOSIS — M629 Disorder of muscle, unspecified: Secondary | ICD-10-CM

## 2012-04-17 NOTE — Progress Notes (Signed)
  Subjective:    Patient ID: Patty [name], female    DOB: 02-Nov-1970, 41 y.o.   MRN: [id number]  HPI Patient comes in today for followup on right the IT band syndrome. She is doing very well. She is now training for half marathon on December 7. She loves her new orthotics. She has been very diligent about working on her hip abduction strength. Still using a body helix knee sleeve. She is without complaint today.    Review of Systems     Objective:   Physical Exam Well-developed, well-nourished. No acute distress  Right knee shows full range of motion with no effusion. No tenderness to palpation along the distal IT band. 5/5 strength with resisted hip abduction. She walks without a limp.       Assessment & Plan:  1. Resolved right knee IT band syndrome  Patient can continue training for her half marathon and continue to increase her running as tolerated. She understands the importance of continuing with her hip abductor strengthening. She will continue with her orthotics. Followup when necessary.

## 2012-04-24 ENCOUNTER — Telehealth (HOSPITAL_COMMUNITY): Payer: Self-pay

## 2012-04-24 ENCOUNTER — Ambulatory Visit (INDEPENDENT_AMBULATORY_CARE_PROVIDER_SITE_OTHER): Payer: 59 | Admitting: Licensed Clinical Social Worker

## 2012-04-24 DIAGNOSIS — F411 Generalized anxiety disorder: Secondary | ICD-10-CM

## 2012-04-24 NOTE — Telephone Encounter (Signed)
Patient called Sertraline 25 mg was not filled with her 100 mg dosage.  Called the pharmacy they did not fill the medication but have the prescription on file.

## 2012-04-24 NOTE — Progress Notes (Signed)
   THERAPIST PROGRESS NOTE  Session Time: 8:00 - 8:50  Participation Level: Active  Behavioral Response: NeatAlertEuthymic  Type of Therapy: Individual Therapy  Treatment Goals addressed: Anxiety  Interventions: Motivational Interviewing and Supportive  Summary: Patty [name] is a 41 y.o. female who presents with anxiety.  Justice came in reporting that they were not going to [facility] Therapy appointments anymore for they felt it was not for them.  They were thinking of going to a regular couples therapist but they have decided that they are doing ok right now.  It is recognized that the jealousy is her problem and she feel she understands it pretty well . right now she is too busy to even think about it.  His brother has gone back out drinking alcoholically and he is separated from his wife and [place]  This is worrying him.. There is a lot of alcoholism in his family.  Fortunately he does not have that inclination.  She talked about how different they are in personality.  She is used to a family which is close and talk ablot while he used to being a alone and not talking .  He has to distance himself in a family get together at some point whereby no one in her family would think of distancing.  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  She is really into her running and training for some races.  He has his biking and she has her running.  She is getting more comfortable about him going out with some people from his work.  He feels good that she is trusting him.  He is very involved with their daughters also.  They both realize that they need to keep their [place] so they can get away more weekends for they really relax and let go there.. .   Suicidal/Homicidal: Nowithout intent/plan  Therapist Response: Pointed out to [name] that his style of distancing and not freely talking may contribute to her feelings of  mistrust.  She is used to openness and closeness so his behavior may feel threatening to her while her behavior feels suffocating to him..Supported her involvement with her children and taking care of herself.  Plan: Return again in 2 weeks.  Diagnosis: Axis I: Generalized Anxiety Disorder    Axis II: Deferred    Patty [name],[name], LCSW 04/24/2012

## 2012-05-07 ENCOUNTER — Ambulatory Visit (HOSPITAL_COMMUNITY): Payer: Self-pay | Admitting: Psychiatry

## 2012-05-15 ENCOUNTER — Ambulatory Visit (HOSPITAL_COMMUNITY): Payer: Self-pay | Admitting: Licensed Clinical Social Worker

## 2012-05-17 ENCOUNTER — Other Ambulatory Visit (HOSPITAL_COMMUNITY): Payer: Self-pay

## 2012-05-17 ENCOUNTER — Ambulatory Visit (HOSPITAL_COMMUNITY): Payer: Self-pay | Admitting: Psychiatry

## 2012-05-17 DIAGNOSIS — F411 Generalized anxiety disorder: Secondary | ICD-10-CM

## 2012-05-17 MED ORDER — SERTRALINE HCL 100 MG PO TABS: ORAL_TABLET | ORAL | Status: DC

## 2012-05-17 MED ORDER — SERTRALINE HCL 25 MG PO TABS: ORAL_TABLET | ORAL | Status: DC

## 2012-05-17 NOTE — Telephone Encounter (Signed)
E-scribed patient's medications; PLAN: Prescribed: 1. Sertraline 100 mg  2. Sertraline 25 mg

## 2012-06-06 ENCOUNTER — Ambulatory Visit (HOSPITAL_COMMUNITY): Payer: Self-pay | Admitting: Licensed Clinical Social Worker

## 2012-06-06 NOTE — Progress Notes (Unsigned)
   THERAPIST PROGRESS NOTE  Session Time: ***  Participation Level: {BHH PARTICIPATION LEVEL:22264}  Behavioral Response: {Appearance:22683}{BHH LEVEL OF CONSCIOUSNESS:22305}{BHH MOOD:22306}  Type of Therapy: {CHL AMB BH Type of Therapy:21022741}  Treatment Goals addressed: {CHL AMB BH Treatment Goals Addressed:21022754}  Interventions: {CHL AMB BH Type of Intervention:21022753}  Summary: Patty [name] is a 42 y.o. female who presents with ***.   Suicidal/Homicidal: {BHH YES OR NO:22294}{yes/no/with/without intent/plan:22693}  Therapist Response: ***  Plan: Return again in *** weeks.  Diagnosis: Axis I: {psych axis 1:31909}    Axis II: {psych axis 2:31910}

## 2012-06-07 ENCOUNTER — Ambulatory Visit (HOSPITAL_COMMUNITY): Payer: Self-pay | Admitting: Psychiatry

## 2012-06-11 ENCOUNTER — Encounter (HOSPITAL_COMMUNITY): Payer: Self-pay | Admitting: Licensed Clinical Social Worker

## 2012-06-19 ENCOUNTER — Ambulatory Visit (HOSPITAL_COMMUNITY): Payer: Self-pay | Admitting: Licensed Clinical Social Worker

## 2012-06-24 ENCOUNTER — Encounter (HOSPITAL_COMMUNITY): Payer: Self-pay | Admitting: Psychiatry

## 2012-06-24 ENCOUNTER — Ambulatory Visit (INDEPENDENT_AMBULATORY_CARE_PROVIDER_SITE_OTHER): Payer: 59 | Admitting: Psychiatry

## 2012-06-24 VITALS — BP 109/65 | HR 63 | Ht 67.0 in | Wt 131.0 lb

## 2012-06-24 DIAGNOSIS — F411 Generalized anxiety disorder: Secondary | ICD-10-CM

## 2012-06-24 MED ORDER — SERTRALINE HCL 100 MG PO TABS: ORAL_TABLET | ORAL | Status: DC

## 2012-06-24 NOTE — Progress Notes (Signed)
[facility] Behavioral Health Follow-up Outpatient Visit  Patty [name] [birth year]  Date: 06/24/2012  History of Chief Complaint:  HPI Comments: Ms. Mcnulty is a 42 Y/o female with a past psychiatric history significant for Adjustment disorder with mixed anxiety and depression. The patient is referred for psychiatric services for medication evaluation.   The patient reports that her relationship her husband has improved. She reports that she has decreased her sertraline dosage to 100 mg an feels better at this dosage.  She states her obsessive thoughts have decreased as well, as well as mistrust in her husband. She does endorse that she does not get enough sleep during the week, and feel much better when she is able to sleep at least 7 hours.   In the area of affective symptoms, patient appears euthymic. Patient denies current suicidal ideation, intent, or plan. Patient denies current homicidal ideation, intent, or plan. Patient denies auditory hallucinations. Patient denies visual hallucinations. Patient denies symptoms of paranoia. Patient states sleep is good, with approximately 5-7  hours. Appetite is good. Energy level is good. Patient denies symptoms of anhedonia. Patient denies hopelessness, helplessness, or guilt.   Denies any recent episodes consistent with mania, particularly decreased need for sleep with increased energy, grandiosity, impulsivity, hyperverbal and pressured speech, or increased productivity. Denies any recent symptoms consistent with psychosis, particularly auditory or visual hallucinations, thought broadcasting/insertion/withdrawal, or ideas of reference. Also denies excessive worry to the point of physical symptoms as well as any panic attacks. Denies any history of trauma or symptoms consistent with PTSD such as flashbacks, nightmares, hypervigilance, feelings of numbness or inability to connect with others.   Review of Systems  Constitutional: Negative.    Respiratory: Negative.  Cardiovascular: Negative.  Gastrointestinal: Negative.   Filed Vitals:   06/24/12 1134  BP: 109/65  Pulse: 63  Height: 5\' 7"  (1.702 m)  Weight: 131 lb (59.421 kg)   Physical Exam  Vitals reviewed.  Constitutional: She appears well-developed and well-nourished. No distress.  Skin: She is not diaphoretic.   Traumatic Brain Injury: No  Past Psychiatric History: Reviewed  Diagnosis: Adjustment disorder with mixed anxiety and depression   Hospitalizations: Patient denies.   Outpatient Care: Currently in therapy. First at age 64   Substance Abuse Care: Patient denies.   Self-Mutilation:Patient denies.   Suicidal Attempts: Patient denies.   Violent Behaviors: Patient denies.    Past Medical History: Reviewed  Past Medical History   Diagnosis  Date   .  Anxiety     History of Loss of Consciousness: No  Seizure History: No  Cardiac History: No  Allergies: No Known Allergies   Current Medications:  Current Outpatient Prescriptions on File Prior to Visit  Medication Sig Dispense Refill  . sertraline (ZOLOFT) 100 MG tablet Take 100 mg  30 tablet  1   Previous Psychotropic Medications:   SUBSTANCE USE HISTORY:  Caffeine: Coffee 2 cups per day.  Nicotine: Patient denies.  Alcohol: Patient denies.  Illicit Drugs: Patient denies.   Blackouts: No   Social History: Reviewed  Current Place of Residence: [place], [place]  Place of Birth: [place], [place]  Family Members: She lives with her husband an 2 daughters. Her younger brother and parents are still living in [place]  Marital Status: Married  Children: 2  Daughters: Ages 8 and 6  Relationships: She reports that her family is her main source of emotional support.  Education: Graduate-Master's degree  Educational Problems/Performance: Some test anxiety with..  Religious Beliefs/Practices: Catholic  History of Abuse: none  Occupational Experiences;  Military History: None.  Legal History: none   Hobbies/Interests: Running. Shopping.   Family History: History reviewed. No pertinent family history.   Mental Status Examination/Evaluation:  Objective: Appearance: Casual   Eye Contact:: Good   Speech: Clear and Coherent and Normal Rate   Volume: Normal   Mood: "okay" 6/10 (0=very depressed; 5 =neutral; 10=very happy)  Affect: Appropriate, Congruent and Full Range   Thought Process: Coherent, Goal Directed, Linear and Logical   Orientation: Full   Thought Content: Obsessions  Suicidal Thoughts: No   Homicidal Thoughts: No   Judgement: Good   Insight: Good   Psychomotor Activity: Normal   Akathisia: No   Handed: Right   Memory: 3/3 Immediate; 3/3 Recent.   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  Anxiety       AXIS IV  other psychosocial or environmental problems   AXIS V  51-60 moderate symptoms    Treatment Plan/Recommendations:  1. Affirm with the patient that the medications are taken as ordered. Patient expressed understanding of how their medications were to be used.  2. Continue the following psychiatric medications as written prior to this appointment with the following changes:  a) Decrease Sertraline 100 mg. Patient reports feeling better with this dosage.  3. Therapy: brief supportive therapy provided. Continue current services.  4. Risks and benefits, side effects and alternatives discussed with patient, she was given an opportunity to ask questions about her medication, illness, and treatment. All  current psychiatric  medications have been reviewed and discussed with the patient and adjusted as clinically appropriate. The patient has been provided an accurate and updated list of the medications being now prescribed.  5. Patient told to call clinic if any problems occur. Patient advised to go to ER if she should develop SI/HI, side effects, or if symptoms worsen. Has crisis numbers to call if needed.  6. No labs warranted at this time.  7. The patient was encouraged to keep all PCP and specialty clinic appointments.  8. Patient was instructed to return to clinic in 2 months.  9. The patient was advised to call and cancel their mental health appointment within 24 hours of the appointment, if they are unable to keep the appointment.  10. The patient expressed understanding of the plan and agrees with the above.  Jacqulyn Cane, MD

## 2012-09-23 ENCOUNTER — Ambulatory Visit (HOSPITAL_COMMUNITY): Payer: Self-pay | Admitting: Psychiatry

## 2012-09-26 ENCOUNTER — Ambulatory Visit (HOSPITAL_COMMUNITY): Payer: Self-pay | Admitting: Psychiatry

## 2012-09-27 ENCOUNTER — Encounter (HOSPITAL_COMMUNITY): Payer: Self-pay | Admitting: Psychiatry

## 2012-09-27 ENCOUNTER — Ambulatory Visit (INDEPENDENT_AMBULATORY_CARE_PROVIDER_SITE_OTHER): Payer: 59 | Admitting: Psychiatry

## 2012-09-27 VITALS — BP 110/68 | HR 73 | Ht 67.0 in | Wt 132.0 lb

## 2012-09-27 DIAGNOSIS — F429 Obsessive-compulsive disorder, unspecified: Secondary | ICD-10-CM

## 2012-09-27 DIAGNOSIS — F411 Generalized anxiety disorder: Secondary | ICD-10-CM

## 2012-09-27 MED ORDER — SERTRALINE HCL 100 MG PO TABS: ORAL_TABLET | ORAL | Status: DC

## 2012-09-27 NOTE — Progress Notes (Signed)
[facility] Behavioral Health Follow-up Outpatient Visit  Patty [name] January 23, 1971    Date: 09/27/2012  History of Chief Complaint:  HPI Comments: Patty [name] is a 42 y/o female with a past psychiatric history significant for Generalized Anxiety Disorder, Obsessive Compulsive Disorder. The patient is referred for psychiatric services for medication management.   She states she is doing well. Her relationship with her husband has improved. The patient reports that she has been running and sleeps very well on days that she runs. She reports that her obsessive compulsive symptoms are well controlled with sertraline at 100 mg.  She worries about being on this medication for the rest of her life but has had two previous trials of discontinuing sertraline with a return of OCD symptoms.She reports that she feels better when she is able to sleep without having to wake up early.  The patient reports she is taking her medications and denies any side effects.   In the area of affective symptoms, patient appears euthymic. Patient denies current suicidal ideation, intent, or plan. Patient denies current homicidal ideation, intent, or plan. Patient denies auditory hallucinations. Patient denies visual hallucinations. Patient denies symptoms of paranoia. Patient states sleep is fair, with approximately 7  hours. Appetite is good. Energy level is good. Patient denies symptoms of anhedonia. Patient denies hopelessness, helplessness, or guilt.   Denies any recent episodes consistent with mania, particularly decreased need for sleep with increased energy, grandiosity, impulsivity, hyperverbal and pressured speech, or increased productivity. Denies any recent symptoms consistent with psychosis, particularly auditory or visual hallucinations, thought broadcasting/insertion/withdrawal, or ideas of reference. Also denies excessive worry to the point of physical symptoms as well as any panic attacks. Denies any history  of trauma or symptoms consistent with PTSD such as flashbacks, nightmares, hypervigilance, feelings of numbness or inability to connect with others.   Review of Systems  Constitutional: Negative for fever, chills, weight loss and malaise/fatigue.  Respiratory: Negative for cough, sputum production, shortness of breath and wheezing.   Cardiovascular: Negative for chest pain, palpitations and leg swelling.  Gastrointestinal: Negative for heartburn, nausea, vomiting, abdominal pain, diarrhea and constipation.  Musculoskeletal:       Denies any muscle weakness or gait abnormalities.  Neurological: Negative for dizziness, tremors, seizures and loss of consciousness.    Filed Vitals:   09/27/12 1543  BP: 110/68  Pulse: 73  Height: 5\' 7"  (1.702 m)  Weight: 132 lb (59.875 kg)   Physical Exam  Vitals reviewed.  Constitutional: She appears well-developed and well-nourished. No distress.  Skin: She is not diaphoretic.   Traumatic Brain Injury: No   Past Psychiatric History: Reviewed  Diagnosis: Adjustment disorder with mixed anxiety and depression   Hospitalizations: Patient denies.   Outpatient Care: Currently in therapy. First at age 34   Substance Abuse Care: Patient denies.   Self-Mutilation:Patient denies.   Suicidal Attempts: Patient denies.   Violent Behaviors: Patient denies.    Past Medical History: Reviewed  Past Medical History   Diagnosis  Date   .  Anxiety     History of Loss of Consciousness: No  Seizure History: No  Cardiac History: No  Allergies: No Known Allergies   Current Medications:  Current Outpatient Prescriptions on File Prior to Visit  Medication Sig Dispense Refill  . fexofenadine-pseudoephedrine (ALLEGRA-D) 60-120 MG per tablet Take 1 tablet by mouth 2 (two) times daily.      . sertraline (ZOLOFT) 100 MG tablet Take 100 mg.  30 tablet  2   No  current facility-administered medications on file prior to visit.    Previous Psychotropic Medications:    SUBSTANCE USE HISTORY:  Caffeine: Coffee 2 cups per day.  Nicotine: Patient denies.  Alcohol: Patient denies.  Illicit Drugs: Patient denies.   Blackouts: No   Social History: Reviewed  Current Place of Residence: [place], [place]  Place of Birth: [place], [place]  Family Members: She lives with her husband an 2 daughters. Her younger brother and parents are still living in [place]  Marital Status: Married  Children: 2  Daughters: Ages 8 and 6  Relationships: She reports that her family is her main source of emotional support.  Education: Graduate-Master's degree  Educational Problems/Performance: Some test anxiety with..  Religious Beliefs/Practices: Catholic  History of Abuse: none  Occupational Experiences;  Military History: None.  Legal History: none  Hobbies/Interests: Running. Shopping.   Family History: History reviewed. No pertinent family history.   Psychiatric Specialty Examination:  Objective: Appearance: Casual   Eye Contact:: Good   Speech: Clear and Coherent and Normal Rate   Volume: Normal   Mood: "pretty good" 8/10 (0=very depressed; 5 =neutral; 10=very happy)  Affect: Appropriate, Congruent and Full Range   Thought Process: Coherent, Goal Directed, Linear and Logical   Orientation: Full   Thought Content: Obsessions  Suicidal Thoughts: No   Homicidal Thoughts: No   Judgement: Good   Insight: Good   Psychomotor Activity: Normal   Akathisia: No   Handed: Right   Memory: 3/3 Immediate; 3/3 Recent.   AIMS (if indicated): None   Assets: Communication Skills  Desire for Improvement  Financial Resources/Insurance  Housing  Intimacy  Leisure Time  Physical Health  Resilience  Social Support  Talents/Skills  Transportation  Vocational/Educational    Laboratory/X-Ray  Psychological Evaluation(s)   None  None    Assessment:  AXIS I  Generalized Anxiety Disorder, Obsessive Compulsive Disorder.  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  Anxiety       AXIS IV  other psychosocial or environmental problems   AXIS V  GAF: 70   Treatment Plan/Recommendations:  1. Affirm with the patient that the medications are taken as ordered. Patient expressed understanding of how their medications were to be used.  2. Continue the following psychiatric medications as written prior to this appointment with the following changes:  a) Will continue Sertraline 100 mg. Patient reports feeling better with this dosage.  3. Therapy: brief supportive therapy provided. Continue current services.  4. Risks and benefits, side effects and alternatives discussed with patient, she was given an opportunity to ask questions about her medication, illness, and treatment. All current psychiatric  medications have been reviewed and discussed with the patient and adjusted as clinically appropriate. The patient has been provided an accurate and updated list of the medications being now prescribed.  5. Patient told to call clinic if any problems occur. Patient advised to go to ER if she should develop SI/HI, side effects, or if symptoms worsen. Has crisis numbers to call if needed.  6. No labs warranted at this time.  7. The patient was encouraged to keep all PCP and specialty clinic appointments.  8. Patient was instructed to return to clinic in 4 months.  9. The patient was advised to call and cancel their mental health appointment within 24 hours of the appointment, if they are unable to  keep the appointment.  10. The patient expressed understanding of the plan and agrees with the above.  Jacqulyn Cane, M.D.  09/27/2012 3:41 PM

## 2013-01-07 ENCOUNTER — Ambulatory Visit (INDEPENDENT_AMBULATORY_CARE_PROVIDER_SITE_OTHER): Payer: 59 | Admitting: Sports Medicine

## 2013-01-07 VITALS — BP 116/74 | Ht 68.0 in | Wt 131.0 lb

## 2013-01-07 DIAGNOSIS — IMO0002 Reserved for concepts with insufficient information to code with codable children: Secondary | ICD-10-CM

## 2013-01-07 DIAGNOSIS — S39011A Strain of muscle, fascia and tendon of abdomen, initial encounter: Secondary | ICD-10-CM

## 2013-01-07 NOTE — Assessment & Plan Note (Addendum)
Exercises are - work up to Doing up to 3 sets of each spaced throughout the day: Broom Handle standing crunches Side Bridges - Build up to 1 minute on each side Front Planks/Bridges - Continue to do, working up to 1 minute at a time  Okay to keep running as long as no pain or pain less than 3/10.   Remember to try to keep your head straight when running - you have been leaning to the left Continue ice up to three times per day for 15-20 minutes at a time. Okay to use Aleve as needed  > If patient returns to care consider repeat ultrasound of the area, consider nitroglycerin patch to this area as there was some evidence of vascularity in the region.  Could also consider this being referred pain from an psoas contracture however given reproducibility of pain with recruitment of external abdominal oblique favor this diagnosis

## 2013-01-07 NOTE — Progress Notes (Signed)
  Sports Medicine Clinic  Patient name: ETHELREDA SUKHU MRN [id number]  Date of birth: Mar 23, 1971  CC & HPI:  Patty [name] is a 42 y.o. female who is returning to care.   Chief Complaint  Patient presents with  . Hip Pain    Right Hip Pain following running Description:  patient reports a right-sided hip pain that tends to bother her at night.  It is described as an ache.  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  Onset/Duration:  aproximally 4 weeks ago.  This is significant for listening injury but does report pain began the day following a significant abdominal work out.   Illicit ing Event/Injury:  No  Severity:  3/10 at rest  Aggravating:  going from sitting to standing, twisting   Effective Therapies:  ibuprofen and ice   Inneffective Therapies:    RED FLAGS & ROS:  no pain while laying on her right side, no pain radiating down her leg, no associated weakness, no significant trauma or falls        ROS:  See HPI  Pertinent History Reviewed:  Medical & Surgical Hx:  Reviewed: Significant for IT band syndrome, has been compliant with her hip abduction exercises, has been wearing her custom orthotics that were previously made here, no concerns or complaints with them Medications: Reviewed & Updated - see associated section Social History: Reviewed -  reports that she has never smoked. She has never used smokeless tobacco.   Objective Findings:  Vitals: BP 116/74  Ht 5\' 8"  (1.727 m)  Wt 131 lb (59.421 kg)  BMI 19.92 kg/m2  PE: GENERAL:  adult athletic female. In no discomfort; no respiratory distress  PSYCH:  alert and appropriate, good insight      NeuroMSK  hip Exam: Appearance:  normal appearing hip   Palpation:  there is no tenderness to palpation of the greater trochanter, gluteus medius/maximus, piriformis,  There is tenderness to palpation over the  superior aspect of the iliac crest located on the medial aspect , there is no appreciable tenderness palpation of the iliacus muscle   ROM: Active Passive  Full      Neurovascular:  lower extremity myotomes 5+/5   MSK Testing:   Bilateral negative log roll, Negative FABER and FADIR, negative greater trochanteric compression bilaterally, slight functional leg length inequality that corrects with sitting , Positive Thomas test on the right  Bilateral hip abduction is 5+/5 in slight flexion in neutral and slight extension, no reproducible pain with this  Prone hip extension and hamstring strengthen 5+/5 without any reproduction of pain Pain was able to be reproduced with cross body sit up.    MSK ultrasound The right iliac crest was investigated and did not show any evidence for muscle rupture or avulsion over the ASIS.  The area that was most tender to palpation did show a questionable hypoechoic change with slight increased vascularity on Doppler but was generally nondiagnostic    Assessment & Plan:  See problem associated charting

## 2013-01-07 NOTE — Patient Instructions (Addendum)
It was nice to see you today.   Today we discussed: Abdominal muscle strain Exercises are - work up to Doing up to 3 sets of each spaced throughout the day: Broom Handle standing crunches Side Bridges - Build up to 1 minute on each side Front Planks/Bridges - Continue to do, working up to 1 minute at a time  Okay to keep running as long as no pain or pain less than 3/10.   Remember to try to keep your head straight when running - you have been leaning to the left Continue ice up to three times per day for 15-20 minutes at a time. Okay to use Aleve as needed    Please plan to return in 4 weeks if not 100%.  If you need anything prior to seeing me please call the clinic.  Please Bring all medications with you to each appointment.

## 2013-01-10 ENCOUNTER — Other Ambulatory Visit: Payer: Self-pay | Admitting: *Deleted

## 2013-01-10 MED ORDER — MELOXICAM 15 MG PO TABS: 15.0000 mg | ORAL_TABLET | Freq: Every day | ORAL | Status: DC

## 2013-01-10 MED ORDER — MELOXICAM 15 MG PO TABS: ORAL_TABLET | ORAL | Status: DC

## 2013-01-15 ENCOUNTER — Ambulatory Visit: Payer: Self-pay | Admitting: Sports Medicine

## 2013-01-17 ENCOUNTER — Other Ambulatory Visit (HOSPITAL_COMMUNITY): Payer: Self-pay | Admitting: Chiropractic Medicine

## 2013-01-17 ENCOUNTER — Other Ambulatory Visit: Payer: Self-pay | Admitting: Chiropractic Medicine

## 2013-01-17 DIAGNOSIS — G8929 Other chronic pain: Secondary | ICD-10-CM

## 2013-01-17 DIAGNOSIS — M25551 Pain in right hip: Secondary | ICD-10-CM

## 2013-01-22 ENCOUNTER — Ambulatory Visit (HOSPITAL_COMMUNITY)
Admission: RE | Admit: 2013-01-22 | Discharge: 2013-01-22 | Disposition: A | Discharge status: Home / Self Care | Payer: 59 | Source: Ambulatory Visit | Attending: Chiropractic Medicine | Admitting: Chiropractic Medicine

## 2013-01-22 DIAGNOSIS — M25551 Pain in right hip: Secondary | ICD-10-CM

## 2013-01-22 DIAGNOSIS — M25559 Pain in unspecified hip: Secondary | ICD-10-CM | POA: Insufficient documentation

## 2013-01-22 DIAGNOSIS — G8929 Other chronic pain: Secondary | ICD-10-CM | POA: Insufficient documentation

## 2013-01-22 MED ORDER — IOHEXOL 180 MG/ML  SOLN
20.0000 mL | Freq: Once | INTRAMUSCULAR | Status: AC | PRN
  Administered 2013-01-22: 15 mL via INTRA_ARTICULAR

## 2013-01-22 MED ORDER — GADOBENATE DIMEGLUMINE 529 MG/ML IV SOLN
5.0000 mL | Freq: Once | INTRAVENOUS | Status: AC | PRN
  Administered 2013-01-22: 0.1 mL via INTRAVENOUS

## 2013-01-23 ENCOUNTER — Ambulatory Visit (INDEPENDENT_AMBULATORY_CARE_PROVIDER_SITE_OTHER): Payer: 59 | Admitting: Sports Medicine

## 2013-01-23 ENCOUNTER — Encounter: Payer: Self-pay | Admitting: Sports Medicine

## 2013-01-23 VITALS — BP 104/70 | Ht 67.5 in | Wt 130.0 lb

## 2013-01-23 DIAGNOSIS — S39011D Strain of muscle, fascia and tendon of abdomen, subsequent encounter: Secondary | ICD-10-CM

## 2013-01-23 DIAGNOSIS — M7630 Iliotibial band syndrome, unspecified leg: Secondary | ICD-10-CM | POA: Insufficient documentation

## 2013-01-23 DIAGNOSIS — G5701 Lesion of sciatic nerve, right lower limb: Secondary | ICD-10-CM | POA: Insufficient documentation

## 2013-01-23 DIAGNOSIS — Z5189 Encounter for other specified aftercare: Secondary | ICD-10-CM

## 2013-01-23 DIAGNOSIS — G5700 Lesion of sciatic nerve, unspecified lower limb: Secondary | ICD-10-CM

## 2013-01-23 DIAGNOSIS — M7631 Iliotibial band syndrome, right leg: Secondary | ICD-10-CM

## 2013-01-23 DIAGNOSIS — M629 Disorder of muscle, unspecified: Secondary | ICD-10-CM

## 2013-01-23 MED ORDER — DICLOFENAC SODIUM 75 MG PO TBEC: 75.0000 mg | DELAYED_RELEASE_TABLET | Freq: Two times a day (BID) | ORAL | Status: DC

## 2013-01-23 NOTE — Assessment & Plan Note (Signed)
Her examination today is consistent with piriformis muscle syndrome. Ultrasound revealed some mild edematous changes within the performance muscle belly. A prescription for diclofenac was ordered. She was given performance muscle stretches. She'll followup in 3 weeks for reevaluation. We've advised her that she may run when she is pain free but to advance running cautiously.

## 2013-01-23 NOTE — Progress Notes (Signed)
Patient ID: Patty [name], female   DOB: 1971-02-28, 42 y.o.   MRN: [id number] Is a 42 year old female who presents for followup of right hip pain. She seen in the sports medicine clinic with anterior hip and lower abdominal pain on August 5. She had been running fairly regularly at that time and was expressing pain the anterior abdominal wall extending down towards her hip. Since that time her pain in that region has somewhat improved and/or migrated to the posterior part of her hip. The pain originates in her buttock region. She notes the pain worse when  walking or running as she puts her foot puts weight onto that leg. She also notes significant discomfort when sitting for prolonged periods in the region of her right buttock.  She's been seeing a chiropractor for ART therapy and an MR arthrogram of the right hip was ordered from him. This was performed yesterday and the results were negative for labral pathology.    The patient is limited her ability to run. She has not run in 1 week. She's continued to do the abdominal strengthening exercises and stretches given to her at her prior appointment here.  She also complains of some mild pain on the lateral aspect of her lower thigh down towards her knee. She reports this is consistent with a prior episode of IT band syndrome. Is not limiting her ability to walk. She reports that that pain in her hip/buttock is much worse and more debilitating.  ROS: As per history of present illness otherwise negative  Examination: BP 104/70  Ht 5' 7.5" (1.715 m)  Wt 130 lb (58.968 kg)  BMI 20.05 kg/m2  LMP 12/29/2012 Is awake alert oriented 42 year old female in no acute distress  Right Hip: ROM IR: 80 Deg, ER: 45 Deg, Flexion: 120 Deg, Extension: 10 Deg, Abduction: 45 Deg, Adduction: 30 Deg Strength IR: 5/5, ER: 5/5, Flexion: 5/5, Extension: 5/5, Abduction: 5/5 (for gluteus medius, maximus and tensor fascia isolation), Adduction: 5/5 FABER negative,  FADIR positive (pain in posterior hip) Pelvic alignment unremarkable to inspection and palpation. Greater trochanter without tenderness to palpation. Significant tenderness noted over the piriformis muscle. No SI joint tenderness and normal minimal SI movement.  Gait is antalgic.  Musculoskeletal ultrasound of the right piriformis muscle was performed. Images obtained revealed evidence of edema within the muscle near its origin.  MRI: images were reviewed in the office with patient.

## 2013-01-23 NOTE — Assessment & Plan Note (Signed)
Today she has mild symptoms consistent with irritation of the IT band in addition to her gluteal discomfort. She's been given IT band stretches as well his performance muscle stretches. With appropriate stretching and resolution of her piriformis syndrome this should resolve.

## 2013-01-23 NOTE — Assessment & Plan Note (Signed)
At today's visit there is no evidence of abdominal muscle tenderness or pain.  Her symptoms appear today to be more posterior in the region of the piriformis muscle.

## 2013-01-27 ENCOUNTER — Encounter (HOSPITAL_COMMUNITY): Payer: Self-pay | Admitting: Psychiatry

## 2013-01-27 ENCOUNTER — Ambulatory Visit (INDEPENDENT_AMBULATORY_CARE_PROVIDER_SITE_OTHER): Payer: 59 | Admitting: Psychiatry

## 2013-01-27 VITALS — BP 107/61 | HR 58 | Ht 67.5 in | Wt 138.0 lb

## 2013-01-27 DIAGNOSIS — F411 Generalized anxiety disorder: Secondary | ICD-10-CM

## 2013-01-27 DIAGNOSIS — F429 Obsessive-compulsive disorder, unspecified: Secondary | ICD-10-CM

## 2013-01-27 MED ORDER — SERTRALINE HCL 100 MG PO TABS: ORAL_TABLET | ORAL | Status: DC

## 2013-01-27 MED ORDER — SERTRALINE HCL 25 MG PO TABS: 25.0000 mg | ORAL_TABLET | Freq: Every day | ORAL | Status: DC

## 2013-01-27 NOTE — Progress Notes (Addendum)
[facility] Behavioral Health Follow-up Outpatient Visit  Patty [name] 1970-07-18    Date: 01/27/2013  History of Chief Complaint:  HPI Comments: Patty [name] is a 42 y/o female with a past psychiatric history significant for Generalized Anxiety Disorder, Obsessive Compulsive Disorder. The patient is referred for psychiatric services for medication management.   The patient reports that she injured her in her hip, has had to stop running, and has had some worsening of her mood over the past three weeks.  She has found it difficult to adjust to not running daily. She has added stresses   She reports that her obsessive symptoms have worsened over the past 3 weeks, which correlates. The patient reports she is taking her medications and denies any side effects.   In the area of affective symptoms, patient appears euthymic. Patient denies current suicidal ideation, intent, or plan. Patient denies current homicidal ideation, intent, or plan. Patient denies auditory hallucinations. Patient denies visual hallucinations. Patient denies symptoms of paranoia. Patient states sleep is fair, with approximately 7 hours. Appetite is good. Energy level is good. Patient denies symptoms of anhedonia. Patient denies hopelessness, helplessness, or guilt.   Denies any recent episodes consistent with mania, particularly decreased need for sleep with increased energy, grandiosity, impulsivity, hyperverbal and pressured speech, or increased productivity. Denies any recent symptoms consistent with psychosis, particularly auditory or visual hallucinations, thought broadcasting/insertion/withdrawal, or ideas of reference. Also denies excessive worry to the point of physical symptoms as well as any panic attacks. Denies any history of trauma or symptoms consistent with PTSD such as flashbacks, nightmares, hypervigilance, feelings of numbness or inability to connect with others.   Review of Systems  Constitutional: Negative  for fever, chills, weight loss and malaise/fatigue.  Respiratory: Negative for cough, sputum production, shortness of breath and wheezing.   Cardiovascular: Negative for chest pain, palpitations and leg swelling.  Gastrointestinal: Negative for heartburn, nausea, vomiting, abdominal pain, diarrhea and constipation.  Musculoskeletal:       Reports myalgias on right hip and lower limbs.  Neurological: Negative for dizziness, tremors, seizures and loss of consciousness.    Filed Vitals:   01/27/13 1309  BP: 107/61  Pulse: 58  Height: 5' 7.5" (1.715 m)  Weight: 138 lb (62.596 kg)   Physical Exam  Vitals reviewed.  Constitutional: She appears well-developed and well-nourished. No distress.  Skin: She is not diaphoretic.  Musculoskeletal: Strength & Muscle Tone: within normal limits Gait & Station: normal Patient leans: Right-due to recent injury   Traumatic Brain Injury: No   Past Psychiatric History: Reviewed  Diagnosis: Adjustment disorder with mixed anxiety and depression   Hospitalizations: Patient denies.   Outpatient Care: Currently in therapy. First at age 9   Substance Abuse Care: Patient denies.   Self-Mutilation:Patient denies.   Suicidal Attempts: Patient denies.   Violent Behaviors: Patient denies.    Past Medical History: Reviewed  Past Medical History   Diagnosis  Date   .  Anxiety     History of Loss of Consciousness: No  Seizure History: No  Cardiac History: No  Allergies: No Known Allergies   Current Medications:  Current Outpatient Prescriptions on File Prior to Visit  Medication Sig Dispense Refill  . diclofenac (VOLTAREN) 75 MG EC tablet Take 1 tablet (75 mg total) by mouth 2 (two) times daily.  30 tablet  1  .  sertraline 100 mg  Take one tablet daliy    . fexofenadine-pseudoephedrine (ALLEGRA-D) 60-120 MG per tablet Take 1 tablet by mouth  2 (two) times daily.       No current facility-administered medications on file prior to visit.     Previous Psychotropic Medications:   SUBSTANCE USE HISTORY:  Caffeine: Coffee 2 cups per day.  Nicotine: Patient denies.  Alcohol: Patient denies.  Illicit Drugs: Patient denies.   Blackouts: No   Social History: Reviewed  Current Place of Residence: [place], [place]  Place of Birth: [place], [place]  Family Members: She lives with her husband an 2 daughters. Her younger brother and parents are still living in [place]  Marital Status: Married  Children: 2  Daughters: Ages 8 and 6  Relationships: She reports that her family is her main source of emotional support.  Education: Graduate-Master's degree  Educational Problems/Performance: Some test anxiety with..  Religious Beliefs/Practices: Catholic  History of Abuse: none  Occupational Experiences;  Military History: None.  Legal History: none  Hobbies/Interests: Running. Shopping.   Family History: History reviewed. No pertinent family history.   Psychiatric Specialty Examination:  Objective: Appearance: Casual   Eye Contact:: Good   Speech: Clear and Coherent and Normal Rate   Volume: Normal   Mood: "pretty good" 8/10 (0=very depressed; 5 =neutral; 10=very happy)  Affect: Appropriate, Congruent and Full Range   Thought Process: Coherent, Goal Directed, Linear and Logical   Orientation: Full   Thought Content: Obsessions  Suicidal Thoughts: No   Homicidal Thoughts: No   Judgement: Good   Insight: Good   Psychomotor Activity: Normal   Akathisia: No   Handed: Right   Memory: 3/3 Immediate; 3/3 Recent.   AIMS (if indicated): None   Assets: Communication Skills  Desire for Improvement  Financial Resources/Insurance  Housing  Intimacy  Leisure Time  Physical Health  Resilience  Social Support  Talents/Skills  Transportation  Vocational/Educational    Laboratory/X-Ray  Psychological Evaluation(s)   None  None    Assessment:  AXIS I  Generalized Anxiety Disorder, Obsessive Compulsive Disorder.  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 Affirm with the patient that the medications are taken as ordered. Patient expressed understanding of how their medications were to be used.  2. Continue the following psychiatric medications as written prior to this appointment with the following changes:  a) Will  increase Sertraline 125 mg daily. Patient instructed to call in 4 weeks with results, will consider increase/decrease based on symptoms.  3. Therapy: brief supportive therapy provided. Continue current services.  4. Risks and benefits, side effects and alternatives discussed with patient, she was given an opportunity to ask questions about her medication, illness, and treatment. All current psychiatric  medications have been reviewed and discussed with the patient and adjusted as clinically appropriate. The patient has been provided an accurate and updated list of the medications being now prescribed.  5. Patient told to call clinic if any problems occur. Patient advised to go to ER if she should develop SI/HI, side effects, or if symptoms worsen. Has crisis numbers to call if needed.  6. No labs warranted at this time.  7. The patient was encouraged to keep all PCP and specialty clinic appointments.  8. Patient was instructed to return to clinic in 4 months.  9. The patient was advised to call and cancel their mental health appointment within 24 hours of the appointment, if they are unable to keep the appointment.  10. The patient expressed understanding of the plan and agrees with the above.  Diva Lemberger  Marveline Profeta, M.D.  01/27/2013 1:07 PM

## 2013-01-29 ENCOUNTER — Ambulatory Visit: Payer: Self-pay | Admitting: Emergency Medicine

## 2013-02-05 ENCOUNTER — Telehealth: Payer: Self-pay | Admitting: Sports Medicine

## 2013-02-05 NOTE — Telephone Encounter (Signed)
I spoke with Patty [name] on the phone today. Still struggling with hip pain but the diclofenac is helping. I recommended that she continue on her diclofenac taking it twice daily until her followup with me next week. I think she is safe to run as tolerated.

## 2013-02-12 ENCOUNTER — Ambulatory Visit (INDEPENDENT_AMBULATORY_CARE_PROVIDER_SITE_OTHER): Payer: 59 | Admitting: Sports Medicine

## 2013-02-12 VITALS — BP 110/69 | Ht 67.5 in | Wt 130.0 lb

## 2013-02-12 DIAGNOSIS — G5701 Lesion of sciatic nerve, right lower limb: Secondary | ICD-10-CM

## 2013-02-12 DIAGNOSIS — G5700 Lesion of sciatic nerve, unspecified lower limb: Secondary | ICD-10-CM

## 2013-02-12 MED ORDER — METHYLPREDNISOLONE ACETATE 80 MG/ML IJ SUSP
80.0000 mg | Freq: Once | INTRAMUSCULAR | Status: AC
  Administered 2013-02-12: 80 mg via INTRAMUSCULAR

## 2013-02-12 NOTE — Progress Notes (Signed)
  Subjective:    Patient ID: Patty [name], female    DOB: 03/06/1971, 42 y.o.   MRN: [id number]  HPI Patty [name] comes in today for followup on right piriformis syndrome. Overall her pain has improved but not resolved. She has not been able to run for the past 4 weeks. She continues to localize her pain directly over the right piriformis. Diclofenac completely eliminates her pain but she does not want to take it chronically. She's currently training for half marathon in mid October and plans on doing a full marathon in December. The groin pain she was experiencing previously has resolved. She has been going to a chiropractor for ART but has not had any formal physical therapy.    Review of Systems     Objective:   Physical Exam Well-developed, well-nourished. No acute distress  Right hip: Tenderness to palpation directly over the right piriformis. No tenderness over the SI joint. Negative log roll. Negative straight leg raise. Walking without a limp.       Assessment & Plan:  Right piriformis syndrome  We will inject her with 80 mg of Depo-Medrol IM today. I'm hoping that she can wean down off of her diclofenac over the next few days. I want her to work with Ellamae Sia as I think she needs some personal one-on-one guidance in physical therapy. Followup with me in 3 weeks. Once she has had a couple of sessions with Jonny Ruiz I am okay with her starting to run again. She will call me with questions or concerns prior to her followup appointment.

## 2013-02-18 ENCOUNTER — Emergency Department (HOSPITAL_COMMUNITY): Payer: 59

## 2013-02-18 ENCOUNTER — Emergency Department (HOSPITAL_COMMUNITY)
Admission: EM | Admit: 2013-02-18 | Discharge: 2013-02-18 | Disposition: A | Discharge status: Home / Self Care | Payer: 59 | Attending: Emergency Medicine | Admitting: Emergency Medicine

## 2013-02-18 ENCOUNTER — Encounter (HOSPITAL_COMMUNITY): Payer: Self-pay | Admitting: Emergency Medicine

## 2013-02-18 DIAGNOSIS — N3289 Other specified disorders of bladder: Secondary | ICD-10-CM | POA: Insufficient documentation

## 2013-02-18 DIAGNOSIS — Z3202 Encounter for pregnancy test, result negative: Secondary | ICD-10-CM | POA: Insufficient documentation

## 2013-02-18 DIAGNOSIS — F411 Generalized anxiety disorder: Secondary | ICD-10-CM | POA: Insufficient documentation

## 2013-02-18 DIAGNOSIS — Z79899 Other long term (current) drug therapy: Secondary | ICD-10-CM | POA: Insufficient documentation

## 2013-02-18 DIAGNOSIS — R112 Nausea with vomiting, unspecified: Secondary | ICD-10-CM | POA: Insufficient documentation

## 2013-02-18 LAB — URINALYSIS, ROUTINE W REFLEX MICROSCOPIC
Glucose, UA: NEGATIVE mg/dL
Leukocytes, UA: NEGATIVE
Protein, ur: NEGATIVE mg/dL
Specific Gravity, Urine: 1.027 (ref 1.005–1.030)
pH: 5.5 (ref 5.0–8.0)

## 2013-02-18 LAB — COMPREHENSIVE METABOLIC PANEL
AST: 18 U/L (ref 0–37)
Albumin: 4.4 g/dL (ref 3.5–5.2)
Alkaline Phosphatase: 52 U/L (ref 39–117)
BUN: 11 mg/dL (ref 6–23)
CO2: 26 mEq/L (ref 19–32)
Chloride: 99 mEq/L (ref 96–112)
GFR calc non Af Amer: >90 mL/min (ref >90)
Potassium: 3.3 mEq/L — ABNORMAL LOW (ref 3.5–5.1)
Total Bilirubin: 0.5 mg/dL (ref 0.3–1.2)

## 2013-02-18 LAB — URINE MICROSCOPIC-ADD ON

## 2013-02-18 LAB — CBC WITH DIFFERENTIAL/PLATELET
Basophils Relative: 0 % (ref 0–1)
HCT: 36.3 % (ref 36.0–46.0)
Hemoglobin: 12.3 g/dL (ref 12.0–15.0)
Lymphocytes Relative: 13 % (ref 12–46)
MCHC: 33.9 g/dL (ref 30.0–36.0)
Monocytes Relative: 7 % (ref 3–12)
Neutro Abs: 10.5 10*3/uL — ABNORMAL HIGH (ref 1.7–7.7)
Neutrophils Relative %: 80 % — ABNORMAL HIGH (ref 43–77)
RBC: 3.97 MIL/uL (ref 3.87–5.11)
WBC: 13.2 10*3/uL — ABNORMAL HIGH (ref 4.0–10.5)

## 2013-02-18 LAB — PREGNANCY, URINE: Preg Test, Ur: NEGATIVE

## 2013-02-18 MED ORDER — OXYCODONE-ACETAMINOPHEN 5-325 MG PO TABS: 1.0000 | ORAL_TABLET | Freq: Four times a day (QID) | ORAL | Status: DC | PRN

## 2013-02-18 MED ORDER — HYDROMORPHONE HCL PF 1 MG/ML IJ SOLN
1.0000 mg | Freq: Once | INTRAMUSCULAR | Status: AC
  Administered 2013-02-18: 1 mg via INTRAVENOUS
  Filled 2013-02-18: qty 1

## 2013-02-18 MED ORDER — ONDANSETRON HCL 4 MG/2ML IJ SOLN
4.0000 mg | Freq: Once | INTRAMUSCULAR | Status: AC
  Administered 2013-02-18: 4 mg via INTRAVENOUS
  Filled 2013-02-18: qty 2

## 2013-02-18 MED ORDER — ONDANSETRON 4 MG PO TBDP: 4.0000 mg | ORAL_TABLET | Freq: Three times a day (TID) | ORAL | Status: DC | PRN

## 2013-02-18 NOTE — ED Notes (Signed)
Pt is having severe right sided abd and flank pain that started about 1 hour ago. C/o nausea but no vomiting.

## 2013-02-18 NOTE — ED Provider Notes (Signed)
CSN: [id number]     Arrival date & time 02/18/13  1538 History   First MD Initiated Contact with Patient 02/18/13 1548     Chief Complaint  Patient presents with  . Flank Pain    right   (Consider location/radiation/quality/duration/timing/severity/associated sxs/prior Treatment) HPI Pt presenting with c/o right sided flank and abdominal pain.  Pt states pain started acutely this afternoon and is sharp and constant.   Some vomiting and nausea, no fever/chills.  She states she was seen by her Gyn this morning and had pelvic exam for routine annual visit.  Pain began later this afternoon.  Denies dysuria.  No vaginal discharge.  No preceding abdominal pain.  No prior symptoms similar to this .  LMP 8/21  Past Medical History  Diagnosis Date  . Anxiety    History reviewed. No pertinent past surgical history. Family History  Problem Relation Age of Onset  . Hypertension Father    History  Substance Use Topics  . Smoking status: Never Smoker   . Smokeless tobacco: Never Used  . Alcohol Use: No     Comment: Social  beer/wine   OB History   Grav Para Term Preterm Abortions TAB SAB Ect Mult Living                 Review of Systems ROS reviewed and all otherwise negative except for mentioned in HPI  Allergies  Review of patient's allergies indicates no known allergies.  Home Medications   Current Outpatient Rx  Name  Route  Sig  Dispense  Refill  . diclofenac (VOLTAREN) 75 MG EC tablet   Oral   Take 75 mg by mouth 2 (two) times daily as needed (leg pain).         Marland Kitchen sertraline (ZOLOFT) 100 MG tablet   Oral   Take 100 mg by mouth daily.         . ondansetron (ZOFRAN ODT) 4 MG disintegrating tablet   Oral   Take 1 tablet (4 mg total) by mouth every 8 (eight) hours as needed for nausea.   20 tablet   0   . oxyCODONE-acetaminophen (PERCOCET/ROXICET) 5-325 MG per tablet   Oral   Take 1-2 tablets by mouth every 6 (six) hours as needed for pain.   30 tablet   0     BP 106/57  Pulse 85  Temp(Src) 99.2 F (37.3 C) (Oral)  Resp 14  SpO2 97%  LMP 01/23/2013 Vitals reviewed Physical Exam Physical Examination: General appearance - alert, well appearing, and in no distress Mental status - alert, oriented to person, place, and time Eyes - no scleral icterus, no conjunctival injection Mouth - mucous membranes moist, pharynx normal without lesions Chest - clear to auscultation, no wheezes, rales or rhonchi, symmetric air entry Heart - normal rate, regular rhythm, normal S1, S2, no murmurs, rubs, clicks or gallops Abdomen - soft, ttp in right lower abdomen, nabs, nondistended, no masses or organomegaly, right CVA tenderness Extremities - peripheral pulses normal, no pedal edema, no clubbing or cyanosis Skin - normal coloration and turgor, no rashes  ED Course  Procedures (including critical care time) Labs Review Labs Reviewed  URINALYSIS, ROUTINE W REFLEX MICROSCOPIC - Abnormal; Notable for the following:    APPearance CLOUDY (*)    Hgb urine dipstick TRACE (*)    All other components within normal limits  CBC WITH DIFFERENTIAL - Abnormal; Notable for the following:    WBC 13.2 (*)    Neutrophils Relative %  80 (*)    Neutro Abs 10.5 (*)    All other components within normal limits  COMPREHENSIVE METABOLIC PANEL - Abnormal; Notable for the following:    Potassium 3.3 (*)    Glucose, Bld 125 (*)    All other components within normal limits  URINE MICROSCOPIC-ADD ON - Abnormal; Notable for the following:    Squamous Epithelial / LPF FEW (*)    Bacteria, UA MANY (*)    All other components within normal limits  URINE CULTURE  PREGNANCY, URINE  LIPASE, BLOOD   Imaging Review No results found.  MDM   1. Mass of urinary bladder    Pt presenting with acute onset of right lower abdominal pain and right flank pain.  Had normal pelvic exam by gyn earlier today prior to onset of symptoms.  CT scan shows mass in posterior right urinary bladder.   I have discussed with radiology and with Dr. Berneice Heinrich, urology- who recommends close outpatient followup for further workup.  I have discussed these results at length with the patient and she verbalized understanding of need for followup and the possibility this may be cancer.  Given rx for pain med for home use as well.  There are no other associated systemic symptoms, there are no other alleviating or modifying factors.     Ethelda Chick, MD 02/21/13 [phone]

## 2013-02-19 ENCOUNTER — Telehealth: Payer: Self-pay | Admitting: Sports Medicine

## 2013-02-19 LAB — URINE CULTURE: Culture: NO GROWTH

## 2013-02-19 NOTE — Telephone Encounter (Signed)
I left a message on Patty [name]'s voicemail earlier this morning. The IM cortisone injection was basically ineffective. My recommendation is for her to resume using her Mobic for a few days while she gets started with Patty [name]. She will keep her regularly scheduled followup appointment.

## 2013-02-19 NOTE — Telephone Encounter (Signed)
Message copied by Ralene Cork on Wed Feb 19, 2013  2:20 PM ------      Message from: Annita Brod      Created: Tue Feb 18, 2013 10:31 AM      Contact: [phone]                   ----- Message -----         From: Claiborne Billings         Sent: 02/18/2013   9:54 AM           To: Lillia Pauls, CMA, Ralene Cork, DO            Had a steroid injection last week and it is not working.  Wonders how long it takes to work. ------

## 2013-02-20 ENCOUNTER — Other Ambulatory Visit (HOSPITAL_COMMUNITY): Payer: Self-pay | Admitting: Urology

## 2013-02-20 DIAGNOSIS — N3289 Other specified disorders of bladder: Secondary | ICD-10-CM

## 2013-02-20 DIAGNOSIS — R9389 Abnormal findings on diagnostic imaging of other specified body structures: Secondary | ICD-10-CM

## 2013-02-26 ENCOUNTER — Ambulatory Visit (HOSPITAL_COMMUNITY)
Admission: RE | Admit: 2013-02-26 | Discharge: 2013-02-26 | Disposition: A | Discharge status: Home / Self Care | Payer: 59 | Source: Ambulatory Visit | Attending: Urology | Admitting: Urology

## 2013-02-26 ENCOUNTER — Ambulatory Visit (HOSPITAL_COMMUNITY): Admission: RE | Admit: 2013-02-26 | Payer: 59 | Source: Ambulatory Visit

## 2013-02-26 DIAGNOSIS — R9389 Abnormal findings on diagnostic imaging of other specified body structures: Secondary | ICD-10-CM

## 2013-02-26 DIAGNOSIS — N83209 Unspecified ovarian cyst, unspecified side: Secondary | ICD-10-CM | POA: Insufficient documentation

## 2013-02-26 DIAGNOSIS — N3289 Other specified disorders of bladder: Secondary | ICD-10-CM

## 2013-02-26 DIAGNOSIS — D251 Intramural leiomyoma of uterus: Secondary | ICD-10-CM | POA: Insufficient documentation

## 2013-02-26 MED ORDER — GADOBENATE DIMEGLUMINE 529 MG/ML IV SOLN
12.0000 mL | Freq: Once | INTRAVENOUS | Status: AC | PRN
  Administered 2013-02-26: 12 mL via INTRAVENOUS

## 2013-03-05 ENCOUNTER — Encounter: Payer: Self-pay | Admitting: Sports Medicine

## 2013-03-05 ENCOUNTER — Ambulatory Visit (INDEPENDENT_AMBULATORY_CARE_PROVIDER_SITE_OTHER): Payer: 59 | Admitting: Sports Medicine

## 2013-03-05 VITALS — BP 116/69 | HR 67 | Ht 67.5 in | Wt 130.0 lb

## 2013-03-05 DIAGNOSIS — G5700 Lesion of sciatic nerve, unspecified lower limb: Secondary | ICD-10-CM

## 2013-03-05 DIAGNOSIS — G5701 Lesion of sciatic nerve, right lower limb: Secondary | ICD-10-CM

## 2013-03-05 MED ORDER — DICLOFENAC EPOLAMINE 1.3 % TD PTCH: 1.0000 | MEDICATED_PATCH | Freq: Two times a day (BID) | TRANSDERMAL | Status: DC

## 2013-03-05 NOTE — Progress Notes (Signed)
  Subjective:    Patient ID: Patty [name], female    DOB: 12/03/1970, 42 y.o.   MRN: [id number]  HPI Patient comes in today for followup. Still struggling with posterior hip pain that she localizes to the piriformis. She work with Ellamae Sia for one single visit and has also worked with Dr. Corinda Gubler. Despite this she continues to have pain with running. She is decided not to run in her upcoming half marathon but is wanting to half marathon in December. Mobic is no longer helpful so she has been taking diclofenac which has been minimally helpful. She has no pain with biking.    Review of Systems     Objective:   Physical Exam Well-developed, well-nourished  Right hip: Tenderness to palpation at the right piriformis. Positive Trendelenburg right. Patient has about a centimeter and a half leg length discrepancy with the right leg being shorter than the left. Walking without a limp.       Assessment & Plan:  Right hip pain secondary to piriformis syndrome  I reviewed the notes from [facility] La. Halloran's office. She has weakness in all planes of hip range of motion. It is 4/5 in the right compared to 5/5 on the left. She also has a positive Thomas test which suggests iliopsoas tightness. I think she will benefit from a few more sessions of physical therapy but I will leave this up to her discretion. Is asking about the possibility of flexor patches and I think that we can try those in lieu of oral anti-inflammatories. I would also like to try a 3/16 inch lift in her right shoe. I think she can continue with activity as tolerated. Followup with me in 3 weeks.

## 2013-03-20 ENCOUNTER — Ambulatory Visit: Payer: Self-pay | Admitting: Sports Medicine

## 2013-05-06 ENCOUNTER — Telehealth (HOSPITAL_COMMUNITY): Payer: Self-pay | Admitting: Psychiatry

## 2013-05-06 MED ORDER — SERTRALINE HCL 100 MG PO TABS: 100.0000 mg | ORAL_TABLET | Freq: Every day | ORAL | Status: DC

## 2013-05-06 NOTE — Telephone Encounter (Signed)
Refilled sertraline 100 mg daily.

## 2013-05-22 ENCOUNTER — Ambulatory Visit (HOSPITAL_COMMUNITY): Payer: Self-pay | Admitting: Psychiatry

## 2013-06-27 ENCOUNTER — Ambulatory Visit (HOSPITAL_COMMUNITY): Payer: Self-pay | Admitting: Psychiatry

## 2013-07-10 ENCOUNTER — Ambulatory Visit (INDEPENDENT_AMBULATORY_CARE_PROVIDER_SITE_OTHER): Payer: BC Managed Care – PPO | Admitting: Psychiatry

## 2013-07-10 ENCOUNTER — Encounter (INDEPENDENT_AMBULATORY_CARE_PROVIDER_SITE_OTHER): Payer: Self-pay

## 2013-07-10 ENCOUNTER — Encounter (HOSPITAL_COMMUNITY): Payer: Self-pay | Admitting: Psychiatry

## 2013-07-10 VITALS — BP 105/56 | HR 63 | Wt 145.0 lb

## 2013-07-10 DIAGNOSIS — F429 Obsessive-compulsive disorder, unspecified: Secondary | ICD-10-CM

## 2013-07-10 DIAGNOSIS — F411 Generalized anxiety disorder: Secondary | ICD-10-CM

## 2013-07-10 MED ORDER — SERTRALINE HCL 100 MG PO TABS: 100.0000 mg | ORAL_TABLET | Freq: Every day | ORAL | Status: DC

## 2013-07-10 NOTE — Progress Notes (Signed)
[name] Outpatient Visit  Patty [name] 1971-01-03  Date: 07/10/2013  History of Chief Complaint:   HPI Comments: [name] is a 43 y/o female with a past psychiatric history significant for Generalized Anxiety Disorder, Obsessive Compulsive Disorder. The patient is referred for psychiatric services for medication management.   Patty [name] patient reports that she is worried about whether she will be moving out of country for her husbands new job.  . Quality:  In the area of affective symptoms, patient appears mildly anxious but otherwise euthymic. Patient denies current suicidal ideation, intent, or plan. Patient denies current homicidal ideation, intent, or plan. Patient denies auditory hallucinations. Patient denies visual hallucinations. Patient denies symptoms of paranoia. Patient states sleep is good, with approximately 8.5 hours per night. Appetite is good. Energy level is good. Patient denies symptoms of anhedonia. Patient /denies hopelessness, helplessness, or guilt.    . Severity: Depression: 8/10 (0=Very depressed; 5=Neutral; 10=Very Happy)  Anxiety- 0/10 (0=no anxiety; 5= moderate/tolerable anxiety; 10= panic attacks)  . Duration Anxiety-early childlhood OCD-early childhood  . Timing; No specific timing  . Context; Moving  . Modifying factors: Mood improves wit hrunning  . Associated signs and symptoms : Denies any recent episodes consistent with mania, particularly decreased need for sleep with increased energy, grandiosity, impulsivity, hyperverbal and pressured speech, or increased productivity. Denies any recent symptoms consistent with psychosis, particularly auditory or visual hallucinations, thought broadcasting/insertion/withdrawal, or ideas of reference. Also denies excessive worry to the point of physical symptoms as well as any panic attacks. Denies any history of trauma or symptoms consistent with PTSD such as  flashbacks, nightmares, hypervigilance, feelings of numbness or inability to connect with others.    Review of Systems  Constitutional: Negative for fever, chills, weight loss and malaise/fatigue.  Respiratory: Negative for cough, sputum production, shortness of breath and wheezing.   Cardiovascular: Negative for chest pain, palpitations and leg swelling.  Gastrointestinal: Negative for heartburn, nausea, vomiting, abdominal pain, diarrhea and constipation.  Musculoskeletal:       Reports myalgias on right hip and lower limbs.  Neurological: Negative for dizziness, tremors, seizures and loss of consciousness.    Filed Vitals:   07/10/13 1526  BP: 105/56  Pulse: 63  Weight: 145 lb (65.772 kg)   Physical Exam  Vitals reviewed.  Constitutional: She appears well-developed and well-nourished. No distress.  Skin: She is not diaphoretic.  Musculoskeletal: Strength & Muscle Tone: within normal limits Gait & Station: normal Patient leans: Right-due to recent injury   Traumatic Brain Injury: No   Past Psychiatric History: Reviewed  Diagnosis: Adjustment disorder with mixed anxiety and depression   Hospitalizations: Patient denies.   Outpatient Care: Currently in therapy. First at age 43   Substance Abuse Care: Patient denies.   Self-Mutilation:Patient denies.   Suicidal Attempts: Patient denies.   Violent Behaviors: Patient denies.    Past Medical History: Reviewed  Past Medical History   Diagnosis  Date   .  Anxiety     History of Loss of Consciousness: No  Seizure History: No  Cardiac History: No  Allergies: No Known Allergies   Current Medications:  Current Outpatient Prescriptions on File Prior to Visit  Medication Sig Dispense Refill  . diclofenac (VOLTAREN) 75 MG EC tablet Take 1 tablet (75 mg total) by mouth 2 (two) times daily.  30 tablet  1  .  sertraline 100 mg  Take one tablet daliy    . fexofenadine-pseudoephedrine (ALLEGRA-D) 60-120 MG per tablet Take  1  tablet by mouth 2 (two) times daily.       No current facility-administered medications on file prior to visit.    Previous Psychotropic Medications:   SUBSTANCE USE HISTORY:  Caffeine: Coffee 2 cups per day.  Nicotine: Patient denies.  Alcohol: Patient denies.  Illicit Drugs: Patient denies.   Blackouts: No   Social History: Reviewed  Current Place of Residence: [place], [place] of Birth: [place], [place]  Family Members: She lives with her husband an 2 daughters. Her younger brother and parents are still living in [place].  Marital Status: Married  Children: 2  Daughters: Ages 67 and 6  Relationships: She reports that her family is her main source of emotional support.  Education: Graduate-Master's degree  Educational Problems/Performance: Some test anxiety with..  Religious Beliefs/Practices: Catholic  History of Abuse: none  Occupational Experiences;  Military History: None.  Legal History: none  Hobbies/Interests: Running. Shopping.   Family History: History reviewed. No pertinent family history.   Psychiatric Specialty Examination:  Objective: Appearance: Casual   Eye Contact:: Good   Speech: Clear and Coherent and Normal Rate   Volume: Normal   Mood: "good"  Affect: Appropriate, Congruent and Full Range   Thought Process: Coherent, Goal Directed, Linear and Logical   Orientation: Full   Thought Content: Obsessions  Suicidal Thoughts: No   Homicidal Thoughts: No   Judgement: Good   Insight: Good   Psychomotor Activity: Normal   Akathisia: No   Handed: Right   Memory: 3/3 Immediate; 3/3 Recent.   AIMS (if indicated): None   Assets: Communication Skills  Desire for Improvement  Financial Resources/Insurance  Housing  Intimacy  Leisure Time  Physical Health  Resilience  Social Support  Talents/Skills  Transportation  Vocational/Educational    Laboratory/X-Ray  Psychological Evaluation(s)   None  None    Assessment:  Generalized Anxiety  Disorder-stable  Obsessive Compulsive Disorder-stable AXIS I  Generalized Anxiety Disorder, Obsessive Compulsive Disorder.  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 Affirm with the patient that the medications are taken as ordered. Patient expressed understanding of how their medications were to be used.  2. Continue the following psychiatric medications as written prior to this appointment with the following changes:  a) Will  decrease Sertraline 100 mg daily. Patient instructed to call in 4 weeks with results, will consider increase/decrease based on symptoms.  3. Therapy: brief supportive therapy provided. Continue current services. More than 50% of the visit was spent on individual therapy/counseling.   4. Risks and benefits, side effects and alternatives discussed with patient, she was given an opportunity to ask questions about her medication, illness, and treatment. All current psychiatric  medications have been reviewed and discussed with the patient and adjusted as clinically appropriate. The patient has been provided an accurate and updated list of the medications being now prescribed.  5. Patient told to call clinic if any problems occur. Patient advised to go to ER if she should develop SI/HI, side effects, or if symptoms worsen. Has crisis numbers to call if needed.  6. No labs warranted at this time.  7. The patient was encouraged to keep all PCP and specialty clinic appointments.  8. Patient was instructed to return to clinic in 4 months.  9. The patient was advised to call and cancel their mental health appointment within 24 hours of the appointment, if they are unable to keep the appointment.  10. The patient expressed understanding of the plan and agrees with the above. Time Spent: 30 minutes Coralyn Helling, M.D.  07/10/2013 3:23 PM

## 2013-08-26 ENCOUNTER — Encounter (INDEPENDENT_AMBULATORY_CARE_PROVIDER_SITE_OTHER): Payer: Self-pay

## 2013-08-26 ENCOUNTER — Ambulatory Visit (INDEPENDENT_AMBULATORY_CARE_PROVIDER_SITE_OTHER): Payer: BC Managed Care – PPO | Admitting: Psychiatry

## 2013-08-26 ENCOUNTER — Encounter (HOSPITAL_COMMUNITY): Payer: Self-pay | Admitting: Psychiatry

## 2013-08-26 VITALS — BP 111/70 | HR 60 | Wt 143.0 lb

## 2013-08-26 DIAGNOSIS — F411 Generalized anxiety disorder: Secondary | ICD-10-CM

## 2013-08-26 DIAGNOSIS — F429 Obsessive-compulsive disorder, unspecified: Secondary | ICD-10-CM

## 2013-08-26 MED ORDER — SERTRALINE HCL 25 MG PO TABS: 25.0000 mg | ORAL_TABLET | Freq: Every day | ORAL | Status: DC

## 2013-08-26 MED ORDER — SERTRALINE HCL 100 MG PO TABS: 100.0000 mg | ORAL_TABLET | Freq: Every day | ORAL | Status: DC

## 2013-08-26 NOTE — Progress Notes (Signed)
[facility] Follow-up Outpatient Visit  Patty [name] Jul 20, 1970  Date: 08/26/2013  History of Chief Complaint:   HPI Comments: Patty [name] is a 43 y/o female with a past psychiatric history significant for Generalized Anxiety Disorder, Obsessive Compulsive Disorder. The patient is referred for psychiatric services for medication management.   Patty [name] patient reports that she has started running. She states she has tolerated the decrease in her Zoloft to 100 mg but has some return of OCD symptoms.  . Quality: She reports relief that she will not have to move as she was not sure if she could handle the lifestyle changes with moving to [place]. She has started running which has improved her mood. She has some return of OCD symptoms which she has been trying to work through.  She reports she is taking her medications and denies any side effects.   In the area of affective symptoms, patient appears mildly anxious.. Patient denies current suicidal ideation, intent, or plan. Patient denies current homicidal ideation, intent, or plan. Patient denies auditory hallucinations. Patient denies visual hallucinations. Patient denies symptoms of paranoia. Patient states sleep is good, with approximately 6.5 -10 hours per night. Appetite is good. Energy level is good. Patient denies symptoms of anhedonia. Patient denies hopelessness, helplessness, or guilt.    . Severity: Depression: 8-9/10 (0=Very depressed; 5=Neutral; 10=Very Happy)  Anxiety- 0/10 (0=no anxiety; 5= moderate/tolerable anxiety; 10= panic attacks)  . Duration Anxiety-early childlhood OCD-early childhood  . Timing; No specific timing  . Context: Some work related stressors.    . Modifying factors: Mood improves with running  . Associated signs and symptoms : Denies any recent episodes consistent with mania, particularly decreased need for sleep with increased energy, grandiosity, impulsivity, hyperverbal  and pressured speech, or increased productivity. Denies any recent symptoms consistent with psychosis, particularly auditory or visual hallucinations, thought broadcasting/insertion/withdrawal, or ideas of reference. Also denies excessive worry to the point of physical symptoms as well as any panic attacks. Denies any history of trauma or symptoms consistent with PTSD such as flashbacks, nightmares, hypervigilance, feelings of numbness or inability to connect with others.    Review of Systems  Constitutional: Negative for fever, chills, weight loss and malaise/fatigue.  Respiratory: Negative for cough, sputum production, shortness of breath and wheezing.   Cardiovascular: Negative for chest pain, palpitations and leg swelling.  Gastrointestinal: Negative for heartburn, nausea, vomiting, abdominal pain, diarrhea and constipation.  Neurological: Negative for dizziness, tremors, seizures and loss of consciousness.    Filed Vitals:   08/26/13 0911  BP: 111/70  Pulse: 60  Weight: 143 lb (64.864 kg)   Physical Exam  Vitals reviewed.  Constitutional: She appears well-developed and well-nourished. No distress.  Skin: She is not diaphoretic.  Musculoskeletal: Gait & Station: normal Patient leans: Right-due to recent injury   Traumatic Brain Injury: No   Past Psychiatric History: Reviewed  Diagnosis: Adjustment disorder with mixed anxiety and depression   Hospitalizations: Patient denies.   Outpatient Care: Currently in therapy. First at age 6   Substance Abuse Care: Patient denies.   Self-Mutilation:Patient denies.   Suicidal Attempts: Patient denies.   Violent Behaviors: Patient denies.    Past Medical History: Reviewed  Past Medical History   Diagnosis  Date   .  Anxiety     History of Loss of Consciousness: No  Seizure History: No  Cardiac History: No  Allergies: No Known Allergies   Current Medications:  Current Outpatient Prescriptions on File Prior to Visit  Medication  Sig Dispense Refill  . diclofenac (VOLTAREN) 75 MG EC tablet Take 1 tablet (75 mg total) by mouth 2 (two) times daily.  30 tablet  1  .  sertraline 100 mg  Take one tablet daliy    . fexofenadine-pseudoephedrine (ALLEGRA-D) 60-120 MG per tablet Take 1 tablet by mouth 2 (two) times daily.       No current facility-administered medications on file prior to visit.    Previous Psychotropic Medications:   SUBSTANCE USE HISTORY:  Caffeine: Coffee 3 cups per day.  Nicotine: Patient denies.  Alcohol: Patient denies.  Illicit Drugs: Patient denies.   Blackouts: No   Social History: Reviewed  Current Place of Residence: [place], [place] of Birth: [place], [place]  Family Members: She lives with her husband an 2 daughters. Her younger brother and parents are still living in [place].  Marital Status: Married  Children: 2  Daughters: Ages 57 and 6  Relationships: She reports that her family is her main source of emotional support.  Education: Graduate-Master's degree  Educational Problems/Performance: Some test anxiety with..  Religious Beliefs/Practices: Catholic  History of Abuse: none  Occupational Experiences;  Military History: None.  Legal History: none  Hobbies/Interests: Running. Shopping.   Family History: History reviewed. No pertinent family history.   Psychiatric Specialty Examination:  Objective: Appearance: Casual   Eye Contact:: Good   Speech: Clear and Coherent and Normal Rate   Volume: Normal   Mood: "good"  Affect: Appropriate, Congruent and Full Range   Thought Process: Coherent, Goal Directed, Linear and Logical   Orientation: Full   Thought Content: Obsessions  Suicidal Thoughts: No   Homicidal Thoughts: No   Judgement: Good   Insight: Good   Psychomotor Activity: Normal   Akathisia: No   Handed: Right   Memory: 3/3 Immediate; 3/3 Recent.   AIMS (if indicated): None   Assets: Communication Skills  Desire for Improvement  Financial  Resources/Insurance  Housing  Intimacy  Leisure Time  Physical Health  Resilience  Social Support  Talents/Skills  Transportation  Vocational/Educational    Laboratory/X-Ray  Psychological Evaluation(s)   None  None    Assessment:  Generalized Anxiety Disorder-stable  Obsessive Compulsive Disorder-stable AXIS I  Generalized Anxiety Disorder, Obsessive Compulsive Disorder-mild worsening.  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 Affirm with the patient that the medications are taken as ordered. Patient expressed understanding of how their medications were to be used.  2. Continue the following psychiatric medications as written prior to this appointment with the following changes:  a)  Sertraline 100 mg daily. Will increase by 25 mg. Patient will call with results. 3. Therapy: brief supportive therapy provided. Continue current services. More than 50% of the visit was spent on individual therapy/counseling.   4. Risks and benefits, side effects and alternatives discussed with patient, she was given an opportunity to ask questions about her medication, illness, and treatment. All current psychiatric  medications have been reviewed and discussed with the patient and adjusted as clinically appropriate. The patient has been provided an accurate and updated list of the medications being now prescribed.  5. Patient told to call clinic if any problems occur. Patient advised to go to ER if she should develop SI/HI, side effects, or if symptoms worsen. Has crisis numbers to call if needed.  6. No labs warranted at this time.  7. The patient was encouraged to keep all PCP and  specialty clinic appointments.  8. Patient was instructed to return to clinic in 3 months.  9. The patient was advised to call and cancel their mental health appointment within 24 hours of the appointment, if they are unable to keep the appointment.   10. The patient expressed understanding of the plan and agrees with the above. 11. Patient informed that April 15th, 2015 would be my last day at this clinic.  Time Spent: 30 minutes Coralyn Helling, M.D.  08/26/2013 9:08 AM

## 2013-10-16 HISTORY — PX: ENDOMETRIAL ABLATION: SHX621

## 2014-02-27 ENCOUNTER — Ambulatory Visit (INDEPENDENT_AMBULATORY_CARE_PROVIDER_SITE_OTHER): Payer: BC Managed Care – PPO | Admitting: Physician Assistant

## 2014-02-27 ENCOUNTER — Encounter (INDEPENDENT_AMBULATORY_CARE_PROVIDER_SITE_OTHER): Payer: Self-pay

## 2014-02-27 ENCOUNTER — Encounter (HOSPITAL_COMMUNITY): Payer: Self-pay | Admitting: Physician Assistant

## 2014-02-27 VITALS — BP 113/58 | HR 56 | Ht 67.5 in | Wt 146.0 lb

## 2014-02-27 DIAGNOSIS — F411 Generalized anxiety disorder: Secondary | ICD-10-CM

## 2014-02-27 DIAGNOSIS — F429 Obsessive-compulsive disorder, unspecified: Secondary | ICD-10-CM

## 2014-02-27 MED ORDER — SERTRALINE HCL 100 MG PO TABS: 100.0000 mg | ORAL_TABLET | Freq: Every day | ORAL | Status: DC

## 2014-02-27 NOTE — Progress Notes (Signed)
  [facility] [phone] Progress Note  Patty [name] [id number] 43 y.o.  02/27/2014 4:34 PM  Chief Complaint: GAD, and OCD.  History of Present Illness: Patient notes that she is doing well, no new complaints, and continues to run 4 days a week. Patient is a Government social research officer for [employer]. She continues to reduce her stress by running and exercising. She feels her symptoms are well controlled.   Suicidal Ideation: No Plan Formed: No Patient has means to carry out plan: No  Homicidal Ideation: No Plan Formed: No Patient has means to carry out plan: No  Review of Systems: Psychiatric: Agitation: No Hallucination: No Depressed Mood: No Insomnia: No Hypersomnia: No Altered Concentration: No Feels Worthless: No Grandiose Ideas: No  Belief In Special Powers: No New/Increased Substance Abuse: No Compulsions: No  Neurologic: Headache: No Seizure: No Paresthesias: No  Past Medical Family, Social History: no changes  Outpatient Encounter Prescriptions as of 02/27/2014  Medication Sig  . sertraline (ZOLOFT) 100 MG tablet Take 1 tablet (100 mg total) by mouth daily.  . [DISCONTINUED] sertraline (ZOLOFT) 25 MG tablet Take 1 tablet (25 mg total) by mouth daily.    Past Psychiatric History/Hospitalization(s): Anxiety: Yes  0/10 Bipolar Disorder: No Depression: No Mania: No Psychosis: No Schizophrenia: No Personality Disorder: No Hospitalization for psychiatric illness: No History of Electroconvulsive Shock Therapy: No Prior Suicide Attempts: No  Physical Exam: Constitutional:  BP 113/58  Pulse 56  Ht 5' 7.5" (1.715 m)  Wt 146 lb (66.225 kg)  BMI 22.52 kg/m2  General Appearance: alert, oriented, no acute distress  Musculoskeletal: Strength & Muscle Tone: within normal limits Gait & Station: normal Patient leans: N/A  Psychiatric: Speech (describe rate, volume, coherence, spontaneity, and abnormalities if any): normal  Thought  Process (describe rate, content, abstract reasoning, and computation): normal  Associations: Coherent and Relevant  Thoughts: normal  Mental Status: Orientation: oriented to person, place and time/date Mood & Affect: normal affect Attention Span & Concentration: normal  Medical Decision Making (Choose Three): Established Problem, Stable/Improving (1)  Assessment: Axis I: GAD, OCD STABLE  Axis II:   Axis III:   Axis IV:   Axis V:   Plan: 1. Continue current plan of care as written. 2. Follow up in 3 months. 3. Call if needed.     Kristene Liberati, PA-C 02/27/2014

## 2014-03-06 ENCOUNTER — Ambulatory Visit (INDEPENDENT_AMBULATORY_CARE_PROVIDER_SITE_OTHER): Payer: BC Managed Care – PPO

## 2014-03-06 ENCOUNTER — Ambulatory Visit (INDEPENDENT_AMBULATORY_CARE_PROVIDER_SITE_OTHER): Payer: BC Managed Care – PPO | Admitting: Sports Medicine

## 2014-03-06 ENCOUNTER — Encounter: Payer: Self-pay | Admitting: Sports Medicine

## 2014-03-06 VITALS — BP 97/40 | HR 51 | Ht 67.5 in | Wt 144.0 lb

## 2014-03-06 DIAGNOSIS — M5416 Radiculopathy, lumbar region: Secondary | ICD-10-CM

## 2014-03-06 DIAGNOSIS — M2170 Unequal limb length (acquired), unspecified site: Secondary | ICD-10-CM

## 2014-03-06 DIAGNOSIS — M5417 Radiculopathy, lumbosacral region: Secondary | ICD-10-CM

## 2014-03-06 DIAGNOSIS — M51369 Other intervertebral disc degeneration, lumbar region without mention of lumbar back pain or lower extremity pain: Secondary | ICD-10-CM | POA: Insufficient documentation

## 2014-03-06 DIAGNOSIS — M549 Dorsalgia, unspecified: Secondary | ICD-10-CM

## 2014-03-06 DIAGNOSIS — M5136 Other intervertebral disc degeneration, lumbar region: Secondary | ICD-10-CM | POA: Insufficient documentation

## 2014-03-06 MED ORDER — PREDNISONE 50 MG PO TABS: ORAL_TABLET | ORAL | Status: DC

## 2014-03-06 MED ORDER — DICLOFENAC SODIUM 75 MG PO TBEC: 75.0000 mg | DELAYED_RELEASE_TABLET | Freq: Two times a day (BID) | ORAL | Status: DC

## 2014-03-06 NOTE — Assessment & Plan Note (Addendum)
She has had several diagnoses including IT band syndrome and piriformis syndrome. She has had a negative pelvic MRI and right hip MRI. A review of her lumbar spine and CT scan she does have L4-L5 central canal stenosis from both disc protrusion and ligamentum flavum hypertrophy. She does get occasional back pain, numbness in her toes, and pain radiating down her leg. Prednisone, lumbar spine PT, Voltaren. Lumbar spine x-rays. Return to see me in 4 weeks, MR of the lumbar spine in anticipation of interventional injection planning if no better.

## 2014-03-06 NOTE — Assessment & Plan Note (Signed)
Heel lift placed into the right shoe.

## 2014-03-06 NOTE — Progress Notes (Signed)
   Subjective:    I'm seeing this patient as a consultation for:  Nena Polio, PA-C and Dr. [place] Sink Radiontchenko  CC: Back, thigh, leg pain  HPI: This is a very pleasant 43 year old female runner, she has a very long history of pain in her right buttock, lateral thigh, and anterior and medial knee. She has had a fairly extensive workup at the [place] including diagnosis for IT band syndrome, pelvic mass, and hip pathology. A pelvic MRI and a right hip MRI were completely negative. On further questioning she has had some back pain, with radiation down the leg in L3 versus an L4 distribution, and with numbness and tingling in her right foot.  Symptoms are moderate, persistent, no bowel or bladder dysfunction or constitutional symptoms.  Past medical history, Surgical history, Family history not pertinant except as noted below, Social history, Allergies, and medications have been entered into the medical record, reviewed, and no changes needed.   Review of Systems: No headache, visual changes, nausea, vomiting, diarrhea, constipation, dizziness, abdominal pain, skin rash, fevers, chills, night sweats, weight loss, swollen lymph nodes, body aches, joint swelling, muscle aches, chest pain, shortness of breath, mood changes, visual or auditory hallucinations.   Objective:   General: Well Developed, well nourished, and in no acute distress.  Neuro/Psych: Alert and oriented x3, extra-ocular muscles intact, able to move all 4 extremities, sensation grossly intact. Skin: Warm and dry, no rashes noted.  Respiratory: Not using accessory muscles, speaking in full sentences, trachea midline.  Cardiovascular: Pulses palpable, no extremity edema. Abdomen: Does not appear distended. Back Exam:  Inspection: Unremarkable  Motion: Flexion 45 deg, Extension 45 deg, Side Bending to 45 deg bilaterally,  Rotation to 45 deg bilaterally  SLR laying: Negative  XSLR laying: Negative    Palpable tenderness: None. FABER: negative. Sensory change: Gross sensation intact to all lumbar and sacral dermatomes.  Reflexes: 2+ at both patellar tendons, 2+ at achilles tendons, Babinski's downgoing.  Strength at foot  Plantar-flexion: 5/5 Dorsi-flexion: 5/5 Eversion: 5/5 Inversion: 5/5  Leg strength  Quad: 5/5 Hamstring: 5/5 Hip flexor: 5/5 Hip abductors: 5/5  Gait unremarkable. Right Hip: ROM IR: 60 Deg, ER: 60 Deg, Flexion: 120 Deg, Extension: 100 Deg, Abduction: 45 Deg, Adduction: 45 Deg Strength IR: 5/5, ER: 5/5, Flexion: 5/5, Extension: 5/5, Abduction: 5/5, Adduction: 5/5 Pelvic alignment unremarkable to inspection and palpation. Standing hip rotation and gait without trendelenburg / unsteadiness. Greater trochanter without tenderness to palpation. No tenderness over piriformis. No SI joint tenderness and normal minimal SI movement.  She did have a leg length discrepancy, right leg approximately 2 cm shorter than the left.  Lumbar spine x-rays were negative however I did personally review her CT scan done some time ago, there is mild lumbar spinal stenosis at the L4-L5 level from both disc protrusion but also ligamentum flavum hypertrophy.  Impression and Recommendations:   This case required medical decision making of moderate complexity.

## 2014-03-10 ENCOUNTER — Ambulatory Visit: Payer: BC Managed Care – PPO | Attending: Sports Medicine

## 2014-03-10 DIAGNOSIS — M5417 Radiculopathy, lumbosacral region: Secondary | ICD-10-CM | POA: Insufficient documentation

## 2014-03-10 DIAGNOSIS — M545 Low back pain: Secondary | ICD-10-CM | POA: Insufficient documentation

## 2014-03-10 DIAGNOSIS — Z5189 Encounter for other specified aftercare: Secondary | ICD-10-CM | POA: Diagnosis present

## 2014-03-10 DIAGNOSIS — M25559 Pain in unspecified hip: Secondary | ICD-10-CM | POA: Insufficient documentation

## 2014-03-10 DIAGNOSIS — E559 Vitamin D deficiency, unspecified: Secondary | ICD-10-CM | POA: Insufficient documentation

## 2014-03-13 ENCOUNTER — Institutional Professional Consult (permissible substitution): Payer: Self-pay | Admitting: Sports Medicine

## 2014-03-18 ENCOUNTER — Ambulatory Visit: Payer: BC Managed Care – PPO | Admitting: Physical Therapy

## 2014-03-18 DIAGNOSIS — Z5189 Encounter for other specified aftercare: Secondary | ICD-10-CM | POA: Diagnosis not present

## 2014-03-25 ENCOUNTER — Ambulatory Visit: Payer: BC Managed Care – PPO | Admitting: Physical Therapy

## 2014-03-25 DIAGNOSIS — Z5189 Encounter for other specified aftercare: Secondary | ICD-10-CM | POA: Diagnosis not present

## 2014-03-30 ENCOUNTER — Ambulatory Visit: Payer: BC Managed Care – PPO | Admitting: Physical Therapy

## 2014-04-03 ENCOUNTER — Encounter: Payer: Self-pay | Admitting: Sports Medicine

## 2014-04-03 ENCOUNTER — Ambulatory Visit (INDEPENDENT_AMBULATORY_CARE_PROVIDER_SITE_OTHER): Payer: BC Managed Care – PPO | Admitting: Sports Medicine

## 2014-04-03 VITALS — BP 104/61 | HR 57 | Ht 67.0 in | Wt 143.0 lb

## 2014-04-03 DIAGNOSIS — M5417 Radiculopathy, lumbosacral region: Secondary | ICD-10-CM

## 2014-04-03 DIAGNOSIS — M5416 Radiculopathy, lumbar region: Secondary | ICD-10-CM

## 2014-04-03 MED ORDER — IBUPROFEN-FAMOTIDINE 800-26.6 MG PO TABS: 1.0000 | ORAL_TABLET | Freq: Three times a day (TID) | ORAL | Status: DC

## 2014-04-03 NOTE — Progress Notes (Signed)
  Subjective:    CC: Follow-up  HPI: Patty [name] returns, she has had right-sided hip pain for some time now, and has been evaluated by another physician by hip and pelvic MRIs both of which were negative. She saw me, pain was also in her back, with radiation around the hip, on an old CT scan I did notice L4-L5 central canal stenosis. Pain is now starting to go down the leg past the knee, she is not improving yet with physical therapy, while she was taking prednisone her symptoms were resolved. She also endorses that when taking ibuprofen her symptoms are resolved.  Past medical history, Surgical history, Family history not pertinant except as noted below, Social history, Allergies, and medications have been entered into the medical record, reviewed, and no changes needed.   Review of Systems: No fevers, chills, night sweats, weight loss, chest pain, or shortness of breath.   Objective:    General: Well Developed, well nourished, and in no acute distress.  Neuro: Alert and oriented x3, extra-ocular muscles intact, sensation grossly intact.  HEENT: Normocephalic, atraumatic, pupils equal round reactive to light, neck supple, no masses, no lymphadenopathy, thyroid nonpalpable.  Skin: Warm and dry, no rashes. Cardiac: Regular rate and rhythm, no murmurs rubs or gallops, no lower extremity edema.  Respiratory: Clear to auscultation bilaterally. Not using accessory muscles, speaking in full sentences.  Impression and Recommendations:

## 2014-04-03 NOTE — Assessment & Plan Note (Signed)
Persistent symptoms despite physical therapy, steroids. She did have relief of pain while taking the prednisone. Ibuprofen does provide good relief, we are going to call in ibuprofen/famotidine. MRI. Symptoms have evolved to below the knee, highly suggestive of an L4 radiculopathy. She does have a 10 mile run coming up, I think it's appropriate to use the ibuprofen and hydrate herself before the run.

## 2014-04-07 ENCOUNTER — Telehealth: Payer: Self-pay

## 2014-04-07 NOTE — Telephone Encounter (Signed)
PA required for MRI lumbar spine without contrast. Called [employer] and PA approved from Oct. 3-May 06, 2014 # [id number].

## 2014-04-07 NOTE — Telephone Encounter (Signed)
Spoke to patient advised patient to go ahead and get MRI done. Adela Esteban,CMA

## 2014-04-07 NOTE — Telephone Encounter (Signed)
I need the MRI to see where we are going before we can do an epidural.

## 2014-04-07 NOTE — Telephone Encounter (Signed)
Patient called stated that she does not want to do the MRI and she cancelled it she stated that she wants an epidural injection. Please advise me if order will be put in for patient. Markies Mowatt,CMA

## 2014-04-08 ENCOUNTER — Ambulatory Visit (HOSPITAL_COMMUNITY): Admission: RE | Admit: 2014-04-08 | Payer: BC Managed Care – PPO | Source: Ambulatory Visit

## 2014-04-08 ENCOUNTER — Ambulatory Visit: Payer: BC Managed Care – PPO | Attending: Sports Medicine | Admitting: Physical Therapy

## 2014-04-08 ENCOUNTER — Ambulatory Visit (HOSPITAL_COMMUNITY): Payer: BC Managed Care – PPO

## 2014-04-08 DIAGNOSIS — M25559 Pain in unspecified hip: Secondary | ICD-10-CM | POA: Insufficient documentation

## 2014-04-08 DIAGNOSIS — Z5189 Encounter for other specified aftercare: Secondary | ICD-10-CM | POA: Insufficient documentation

## 2014-04-08 DIAGNOSIS — M5417 Radiculopathy, lumbosacral region: Secondary | ICD-10-CM | POA: Insufficient documentation

## 2014-04-08 DIAGNOSIS — M545 Low back pain: Secondary | ICD-10-CM | POA: Insufficient documentation

## 2014-04-13 ENCOUNTER — Ambulatory Visit: Payer: BC Managed Care – PPO | Admitting: Physical Therapy

## 2014-04-13 DIAGNOSIS — Z5189 Encounter for other specified aftercare: Secondary | ICD-10-CM | POA: Diagnosis not present

## 2014-04-13 DIAGNOSIS — M545 Low back pain: Secondary | ICD-10-CM | POA: Diagnosis not present

## 2014-04-13 DIAGNOSIS — M25559 Pain in unspecified hip: Secondary | ICD-10-CM | POA: Diagnosis not present

## 2014-04-13 DIAGNOSIS — M5417 Radiculopathy, lumbosacral region: Secondary | ICD-10-CM | POA: Diagnosis not present

## 2014-04-17 ENCOUNTER — Encounter: Payer: Self-pay | Admitting: Sports Medicine

## 2014-04-17 ENCOUNTER — Other Ambulatory Visit: Payer: Self-pay | Admitting: Sports Medicine

## 2014-04-17 ENCOUNTER — Ambulatory Visit (INDEPENDENT_AMBULATORY_CARE_PROVIDER_SITE_OTHER): Payer: BC Managed Care – PPO | Admitting: Sports Medicine

## 2014-04-17 ENCOUNTER — Ambulatory Visit (INDEPENDENT_AMBULATORY_CARE_PROVIDER_SITE_OTHER): Payer: BC Managed Care – PPO

## 2014-04-17 ENCOUNTER — Telehealth: Payer: Self-pay

## 2014-04-17 DIAGNOSIS — M5416 Radiculopathy, lumbar region: Secondary | ICD-10-CM

## 2014-04-17 DIAGNOSIS — M545 Low back pain: Secondary | ICD-10-CM

## 2014-04-17 DIAGNOSIS — M5417 Radiculopathy, lumbosacral region: Secondary | ICD-10-CM

## 2014-04-17 MED ORDER — PREDNISONE (PAK) 10 MG PO TABS: ORAL_TABLET | ORAL | Status: DC

## 2014-04-17 NOTE — Progress Notes (Signed)
  Subjective:    CC: follow-up  HPI: I have been seeing Dalynn now for approximately a month for lumbar radiculopathy, right-sided L4, moderate, persistent, more recently she had worsening of her symptoms and desires to initiate aggressive treatment. No bowel or bladder dysfunction or saddle numbness. I have already ordered an MRI however she looked into this and it is cost prohibitive. She wonders if we can do an injection without cross-sectional imaging.  Past medical history, Surgical history, Family history not pertinant except as noted below, Social history, Allergies, and medications have been entered into the medical record, reviewed, and no changes needed.   Review of Systems: No fevers, chills, night sweats, weight loss, chest pain, or shortness of breath.   Objective:    General: Well Developed, well nourished, and in no acute distress.  Neuro: Alert and oriented x3, extra-ocular muscles intact, sensation grossly intact.  HEENT: Normocephalic, atraumatic, pupils equal round reactive to light, neck supple, no masses, no lymphadenopathy, thyroid nonpalpable.  Skin: Warm and dry, no rashes. Cardiac: Regular rate and rhythm, no murmurs rubs or gallops, no lower extremity edema.  Respiratory: Clear to auscultation bilaterally. Not using accessory muscles, speaking in full sentences.  Stat CT obtained, showing L4-5 and L5-S1 disc protrusions, worse at the L4-L5 level with what appears to be mild central canal stenosis, and some bilateral foraminal stenosis.  Impression and Recommendations:

## 2014-04-17 NOTE — Telephone Encounter (Signed)
Patient stated that her pain is going past her knee and she was previously was on prednisone and that seemed to help her she is requesting a Rx for Prednisone sent to her pharmacy. Rhonda Cunningham,CMA

## 2014-04-17 NOTE — Telephone Encounter (Signed)
Patient came in for visit.

## 2014-04-17 NOTE — Assessment & Plan Note (Addendum)
Persistent right L4 radicular symptoms. MRI was cost prohibitive. CT of the lumbar spine for interventional injection planning. We will likely order an epidural before her follow-up visit. Prednisone in the meantime per patient request.  Right-sided L4-L5 transforaminal epidural.

## 2014-04-18 MED ORDER — PREDNISONE (PAK) 10 MG PO TABS
ORAL_TABLET | ORAL | Status: DC
Start: 2014-04-18 — End: 2014-05-06

## 2014-04-18 NOTE — Telephone Encounter (Signed)
The first prescription was sent to [facility]. I sent in same prescription to [place].

## 2014-04-18 NOTE — Telephone Encounter (Signed)
First prescription was sent to [facility]. I sent same prescription to [facility].

## 2014-04-19 NOTE — Telephone Encounter (Signed)
Thanks so much Levada Dy!

## 2014-04-20 ENCOUNTER — Ambulatory Visit: Payer: BC Managed Care – PPO | Admitting: Physical Therapy

## 2014-04-20 DIAGNOSIS — Z5189 Encounter for other specified aftercare: Secondary | ICD-10-CM | POA: Diagnosis not present

## 2014-04-22 ENCOUNTER — Other Ambulatory Visit: Payer: Self-pay

## 2014-04-22 ENCOUNTER — Ambulatory Visit
Admission: RE | Admit: 2014-04-22 | Discharge: 2014-04-22 | Disposition: A | Discharge status: Home / Self Care | Payer: BC Managed Care – PPO | Source: Ambulatory Visit | Attending: Sports Medicine | Admitting: Sports Medicine

## 2014-04-22 DIAGNOSIS — M5416 Radiculopathy, lumbar region: Secondary | ICD-10-CM

## 2014-04-22 MED ORDER — METHYLPREDNISOLONE ACETATE 40 MG/ML INJ SUSP (RADIOLOG
120.0000 mg | Freq: Once | INTRAMUSCULAR | Status: AC
  Administered 2014-04-22: 120 mg via EPIDURAL

## 2014-04-22 MED ORDER — IOHEXOL 180 MG/ML  SOLN
1.0000 mL | Freq: Once | INTRAMUSCULAR | Status: AC | PRN
  Administered 2014-04-22: 1 mL via EPIDURAL

## 2014-04-22 NOTE — Discharge Instructions (Signed)

## 2014-04-27 ENCOUNTER — Ambulatory Visit: Payer: BC Managed Care – PPO | Admitting: Physical Therapy

## 2014-04-29 ENCOUNTER — Ambulatory Visit: Payer: BC Managed Care – PPO | Admitting: Physical Therapy

## 2014-04-29 DIAGNOSIS — Z5189 Encounter for other specified aftercare: Secondary | ICD-10-CM | POA: Diagnosis not present

## 2014-05-04 ENCOUNTER — Ambulatory Visit: Payer: BC Managed Care – PPO | Admitting: Physical Therapy

## 2014-05-04 DIAGNOSIS — Z5189 Encounter for other specified aftercare: Secondary | ICD-10-CM | POA: Diagnosis not present

## 2014-05-06 ENCOUNTER — Ambulatory Visit (INDEPENDENT_AMBULATORY_CARE_PROVIDER_SITE_OTHER): Payer: BC Managed Care – PPO | Admitting: Sports Medicine

## 2014-05-06 ENCOUNTER — Encounter: Payer: Self-pay | Admitting: Sports Medicine

## 2014-05-06 VITALS — BP 118/67 | HR 67 | Ht 67.0 in | Wt 140.0 lb

## 2014-05-06 DIAGNOSIS — M5416 Radiculopathy, lumbar region: Secondary | ICD-10-CM

## 2014-05-06 DIAGNOSIS — M5417 Radiculopathy, lumbosacral region: Secondary | ICD-10-CM

## 2014-05-06 NOTE — Assessment & Plan Note (Signed)
Excellent response with 100% pain relief after a right-sided L4-L5 transforaminal epidural. Continues to be pain-free and happy with results so far. Return as needed.

## 2014-05-06 NOTE — Progress Notes (Signed)
  Subjective:    CC: follow-up after her epidural  HPI: Patty [name] returns, she is a pleasant 43 year old female with lumbar radiculopathy, she recently had an L4-L5 transforaminal epidural and returns completely pain-free.  Past medical history, Surgical history, Family history not pertinant except as noted below, Social history, Allergies, and medications have been entered into the medical record, reviewed, and no changes needed.   Review of Systems: No fevers, chills, night sweats, weight loss, chest pain, or shortness of breath.   Objective:    General: Well Developed, well nourished, and in no acute distress.  Neuro: Alert and oriented x3, extra-ocular muscles intact, sensation grossly intact.  HEENT: Normocephalic, atraumatic, pupils equal round reactive to light, neck supple, no masses, no lymphadenopathy, thyroid nonpalpable.  Skin: Warm and dry, no rashes. Cardiac: Regular rate and rhythm, no murmurs rubs or gallops, no lower extremity edema.  Respiratory: Clear to auscultation bilaterally. Not using accessory muscles, speaking in full sentences.  Impression and Recommendations:

## 2014-05-07 ENCOUNTER — Ambulatory Visit: Payer: BC Managed Care – PPO | Admitting: Physical Therapy

## 2014-05-11 ENCOUNTER — Encounter: Payer: Self-pay | Admitting: Physical Therapy

## 2014-05-13 ENCOUNTER — Ambulatory Visit: Payer: BC Managed Care – PPO

## 2014-05-14 ENCOUNTER — Encounter: Payer: Self-pay | Admitting: Physical Therapy

## 2014-06-02 ENCOUNTER — Encounter (HOSPITAL_COMMUNITY): Payer: Self-pay | Admitting: Physician Assistant

## 2014-06-02 ENCOUNTER — Ambulatory Visit (INDEPENDENT_AMBULATORY_CARE_PROVIDER_SITE_OTHER): Payer: BC Managed Care – PPO | Admitting: Physician Assistant

## 2014-06-02 VITALS — BP 96/57 | HR 70 | Ht 67.0 in | Wt 140.0 lb

## 2014-06-02 DIAGNOSIS — F42 Obsessive-compulsive disorder: Secondary | ICD-10-CM

## 2014-06-02 DIAGNOSIS — E559 Vitamin D deficiency, unspecified: Secondary | ICD-10-CM

## 2014-06-02 DIAGNOSIS — F411 Generalized anxiety disorder: Secondary | ICD-10-CM

## 2014-06-02 DIAGNOSIS — F429 Obsessive-compulsive disorder, unspecified: Secondary | ICD-10-CM

## 2014-06-02 MED ORDER — SERTRALINE HCL 100 MG PO TABS: 100.0000 mg | ORAL_TABLET | Freq: Every day | ORAL | Status: DC

## 2014-06-02 MED ORDER — SERTRALINE HCL 25 MG PO TABS: 25.0000 mg | ORAL_TABLET | Freq: Every day | ORAL | Status: DC

## 2014-06-02 NOTE — Progress Notes (Addendum)
  [facility] [phone] Progress Note  Patty [name] [id number] 43 y.o.  06/02/2014 4:11 PM  Chief Complaint: GAD, and OCD.  History of Present Illness: Patient notes that she is feeling much better and Dr. Darene Lamer has fixed her hip and back pain. She ended up needing an epidural injection for the pain which has now resolved. She is missing running and is looking forward to returning to it. She is very grateful for the referral.     She did increase her Zoloft to 125mg  a day as she started becoming a bit more anxious and that has helped a lot. She was also severely Vitamin D deficient as well and has not been retested. Suicidal Ideation: No Plan Formed: No Patient has means to carry out plan: No  Homicidal Ideation: No Plan Formed: No Patient has means to carry out plan: No  Review of Systems: Psychiatric: Agitation: No Hallucination: No Depressed Mood: No Insomnia: No Hypersomnia: No Altered Concentration: No Feels Worthless: No Grandiose Ideas: No  Belief In Special Powers: No New/Increased Substance Abuse: No Compulsions: No  Neurologic: Headache: No Seizure: No Paresthesias: No  Past Medical Family, Social History: no changes  Outpatient Encounter Prescriptions as of 06/02/2014  Medication Sig  . diclofenac (VOLTAREN) 75 MG EC tablet   . Ibuprofen-Famotidine 800-26.6 MG TABS Take 1 tablet by mouth 3 (three) times daily.  . Multiple Vitamin (MULTIVITAMIN) tablet Take 1 tablet by mouth.  . norethindrone-ethinyl estradiol (JUNEL FE,GILDESS FE,LOESTRIN FE) 1-20 MG-MCG tablet Take 1 tablet by mouth.  . sertraline (ZOLOFT) 100 MG tablet Take 1 tablet (100 mg total) by mouth daily.    Past Psychiatric History/Hospitalization(s): Anxiety: Yes  0/10 Bipolar Disorder: No Depression: No Mania: No Psychosis: No Schizophrenia: No Personality Disorder: No Hospitalization for psychiatric illness: No History of Electroconvulsive Shock Therapy: No Prior Suicide  Attempts: No  Physical Exam: Constitutional:  BP 96/57 mmHg  Pulse 70  Ht 5\' 7"  (1.702 m)  Wt 140 lb (63.504 kg)  BMI 21.92 kg/m2  General Appearance: alert, oriented, no acute distress  Musculoskeletal: Strength & Muscle Tone: within normal limits Gait & Station: normal Patient leans: N/A  Psychiatric: Speech (describe rate, volume, coherence, spontaneity, and abnormalities if any): normal  Thought Process (describe rate, content, abstract reasoning, and computation): normal  Associations: Coherent and Relevant  Thoughts: normal  Mental Status: Orientation: oriented to person, place and time/date Mood & Affect: normal affect Attention Span & Concentration: normal  Medical Decision Making (Choose Three): Established Problem, Stable/Improving (1)  Assessment: Chart review shows that she started feeling more anxious after she received her second round of steroids which is likely the cause of her increased anxiousness.  Axis I: GAD, OCD STABLE  Plan: 1. Will continue Zoloft at 125mg  for the next 2-3 months, then if she does not need another steroid injection, can lower it back to 100 with a goal of d/c it over the next several months. 2. Will recheck Vitamin D level. 3. She will start Vit. D3 2000 IU qd x 2, as well as B complex each day. 4. Follow up in 3-4 weeks for review of labs. 5 Call if needed.     Lorelei Heikkila, PA-C 06/02/2014

## 2014-06-02 NOTE — Addendum Note (Signed)
Addended by: Nena Polio T on: 06/02/2014 04:53 PM   Modules accepted: Orders

## 2014-06-06 LAB — VITAMIN D 1,25 DIHYDROXY
VITAMIN D 1, 25 (OH) TOTAL: 55 pg/mL (ref 18–72)
Vitamin D2 1, 25 (OH)2: <8 pg/mL
Vitamin D3 1, 25 (OH)2: 55 pg/mL

## 2014-06-11 ENCOUNTER — Encounter: Payer: Self-pay | Admitting: Sports Medicine

## 2014-06-12 ENCOUNTER — Ambulatory Visit (INDEPENDENT_AMBULATORY_CARE_PROVIDER_SITE_OTHER): Payer: BLUE CROSS/BLUE SHIELD | Admitting: Sports Medicine

## 2014-06-12 ENCOUNTER — Encounter: Payer: Self-pay | Admitting: Sports Medicine

## 2014-06-12 VITALS — BP 111/63 | HR 70 | Ht 67.0 in | Wt 138.0 lb

## 2014-06-12 DIAGNOSIS — M5416 Radiculopathy, lumbar region: Secondary | ICD-10-CM

## 2014-06-12 DIAGNOSIS — M84353A Stress fracture, unspecified femur, initial encounter for fracture: Secondary | ICD-10-CM | POA: Insufficient documentation

## 2014-06-12 DIAGNOSIS — S76912A Strain of unspecified muscles, fascia and tendons at thigh level, left thigh, initial encounter: Secondary | ICD-10-CM

## 2014-06-12 DIAGNOSIS — S76812A Strain of other specified muscles, fascia and tendons at thigh level, left thigh, initial encounter: Secondary | ICD-10-CM

## 2014-06-12 DIAGNOSIS — M5417 Radiculopathy, lumbosacral region: Secondary | ICD-10-CM

## 2014-06-12 NOTE — Assessment & Plan Note (Signed)
Restart anti-inflammatories. Hip flexor rehabilitation exercises. No imaging needed. I am also going to start her on a walk/run progression.  Return to see me in 3 weeks to see how things are going.

## 2014-06-12 NOTE — Assessment & Plan Note (Signed)
Continues to be completely resolved after right-sided L4-L5 intralaminar epidural in mid November 2015.

## 2014-06-12 NOTE — Patient Instructions (Signed)
Start a walk-run progression: - I would like you to do line drills (try to keep foot on a line when jogging). - Initially start one minute walking than one minute running for 20 mins in the first week,   then 25 mins during the second week, then 30 mins afterwards.  Once you have reached 30 mins: - Run 2 mins, then walk 1 min. -Then run 3 mins, and walk 1 min. -Then run 4 mins, and walk 1 min. -Then run 5 mins, and walk 1 min. -Slowly build up weekly to running 30 mins nonstop.  If painful at any of the steps, back up one step.  

## 2014-06-12 NOTE — Progress Notes (Addendum)
  Subjective:    CC: Left Hip pain  HPI: This is a very pleasant 44 year old female, initially she had a right-sided L4 radiculopathy which resolved completely after a right-sided L4-L5 intralaminar epidural in mid-November. Since then she completed a half marathon mid-December, did well afterwards with only expected soreness. Subsequently she had several shorter runs, the most recent of which was on Monday, 4 days ago.  Since then she's had moderate to severe pain that she localizes in the left groin, worse with weightbearing and worse with flexion of the hip. She denies any radicular symptoms, back pain is gone, no mechanical symptoms.  Past medical history, Surgical history, Family history not pertinant except as noted below, Social history, Allergies, and medications have been entered into the medical record, reviewed, and no changes needed.   Review of Systems: No fevers, chills, night sweats, weight loss, chest pain, or shortness of breath.   Objective:    General: Well Developed, well nourished, and in no acute distress.  Neuro: Alert and oriented x3, extra-ocular muscles intact, sensation grossly intact.  HEENT: Normocephalic, atraumatic, pupils equal round reactive to light, neck supple, no masses, no lymphadenopathy, thyroid nonpalpable.  Skin: Warm and dry, no rashes. Cardiac: Regular rate and rhythm, no murmurs rubs or gallops, no lower extremity edema.  Respiratory: Clear to auscultation bilaterally. Not using accessory muscles, speaking in full sentences. Left Hip: ROM IR: 60 Deg, ER: 60 Deg, Flexion: 120 Deg, Extension: 100 Deg, Abduction: 45 Deg, Adduction: 45 Deg Strength IR: 5/5, ER: 5/5, Flexion: 5/5, Extension: 5/5, Abduction: 5/5, Adduction: 5/5 There is reproduction of pain with resisted flexion of the hip, internal rotation causes no pain. Passive extension of the hip with the knee flexed does not reproduce pain, subsequent passive flexion of the hip with knee extension  reproduces the pain. Pelvic alignment unremarkable to inspection and palpation. Standing hip rotation and gait without trendelenburg / unsteadiness. Greater trochanter without tenderness to palpation. No tenderness over piriformis. No SI joint tenderness and normal minimal SI movement. There is no pain with aggressive percussion of the heel with the knee extended. This points away from a femoral neck stress fracture.  Impression and Recommendations:

## 2014-06-23 ENCOUNTER — Ambulatory Visit (HOSPITAL_COMMUNITY): Payer: Self-pay | Admitting: Physician Assistant

## 2014-06-26 ENCOUNTER — Ambulatory Visit (HOSPITAL_COMMUNITY): Payer: Self-pay | Admitting: Physician Assistant

## 2014-06-30 ENCOUNTER — Ambulatory Visit (HOSPITAL_COMMUNITY): Payer: Self-pay | Admitting: Physician Assistant

## 2014-07-03 ENCOUNTER — Encounter: Payer: Self-pay | Admitting: Sports Medicine

## 2014-07-03 ENCOUNTER — Ambulatory Visit (INDEPENDENT_AMBULATORY_CARE_PROVIDER_SITE_OTHER): Payer: BLUE CROSS/BLUE SHIELD | Admitting: Sports Medicine

## 2014-07-03 DIAGNOSIS — M51369 Other intervertebral disc degeneration, lumbar region without mention of lumbar back pain or lower extremity pain: Secondary | ICD-10-CM

## 2014-07-03 DIAGNOSIS — M5136 Other intervertebral disc degeneration, lumbar region: Secondary | ICD-10-CM

## 2014-07-03 NOTE — Progress Notes (Signed)
  Subjective:    CC: follow-up  HPI: Left hip pain: Initially suspected to be a hip flexor strain, she has not responded to a month of hip flexor rehabilitation. She tells me now that her pain is very similar to her right-sided L4 radiculopathy. Moderate, persistent.  Right L4 radiculopathy: responded well with 100% pain relief after a right-sided L4-L5 transforaminal epidural.  Past medical history, Surgical history, Family history not pertinant except as noted below, Social history, Allergies, and medications have been entered into the medical record, reviewed, and no changes needed.   Review of Systems: No fevers, chills, night sweats, weight loss, chest pain, or shortness of breath.   Objective:    General: Well Developed, well nourished, and in no acute distress.  Neuro: Alert and oriented x3, extra-ocular muscles intact, sensation grossly intact.  HEENT: Normocephalic, atraumatic, pupils equal round reactive to light, neck supple, no masses, no lymphadenopathy, thyroid nonpalpable.  Skin: Warm and dry, no rashes. Cardiac: Regular rate and rhythm, no murmurs rubs or gallops, no lower extremity edema.  Respiratory: Clear to auscultation bilaterally. Not using accessory muscles, speaking in full sentences. Back Exam:  Inspection: Unremarkable  Motion: Flexion 45 deg, Extension 45 deg, Side Bending to 45 deg bilaterally,  Rotation to 45 deg bilaterally  SLR laying: Negative  XSLR laying: Negative  Palpable tenderness: None. FABER: negative. Sensory change: Gross sensation intact to all lumbar and sacral dermatomes.  Reflexes: 2+ at both patellar tendons, 2+ at achilles tendons, Babinski's downgoing.  Strength at foot  Plantar-flexion: 5/5 Dorsi-flexion: 5/5 Eversion: 5/5 Inversion: 5/5  Leg strength  Quad: 5/5 Hamstring: 5/5 Hip flexor: 5/5 Hip abductors: 5/5  Gait unremarkable. Left Hip: ROM IR: 60 Deg, ER: 60 Deg, Flexion: 120 Deg, Extension: 100 Deg, Abduction: 45 Deg,  Adduction: 45 Deg Strength IR: 5/5, ER: 5/5, Flexion: 5/5, Extension: 5/5, Abduction: 5/5, Adduction: 5/5 Pelvic alignment unremarkable to inspection and palpation. Standing hip rotation and gait without trendelenburg / unsteadiness. Greater trochanter without tenderness to palpation. No tenderness over piriformis. No SI joint tenderness and normal minimal SI movement.  Impression and Recommendations:

## 2014-07-03 NOTE — Patient Instructions (Signed)
Rotating machine, obliques. Crunches, spine flexors. Back extension machine, spine extensors.

## 2014-07-03 NOTE — Assessment & Plan Note (Signed)
There is a broad-based L4-L5 disc protrusion. She had a fantastic response to a right-sided selective L4 nerve root block, now is having exactly the same symptoms on the left side. Initially we suspected that this was a hip flexor strain however rehabilitation has not been effective. We are going to proceed with a left-sided selective L4 nerve root block. I would also like her to work extensively on core strengthening in the gym in the meantime. Continue Duexis. Return to see me 3 weeks after the injection.

## 2014-07-07 DIAGNOSIS — M5126 Other intervertebral disc displacement, lumbar region: Secondary | ICD-10-CM | POA: Insufficient documentation

## 2014-07-08 ENCOUNTER — Ambulatory Visit
Admission: RE | Admit: 2014-07-08 | Discharge: 2014-07-08 | Disposition: A | Discharge status: Home / Self Care | Payer: BLUE CROSS/BLUE SHIELD | Source: Ambulatory Visit | Attending: Sports Medicine | Admitting: Sports Medicine

## 2014-07-08 MED ORDER — METHYLPREDNISOLONE ACETATE 40 MG/ML INJ SUSP (RADIOLOG
120.0000 mg | Freq: Once | INTRAMUSCULAR | Status: AC
Start: 2014-07-08 — End: 2014-07-08
  Administered 2014-07-08: 120 mg via EPIDURAL

## 2014-07-08 MED ORDER — IOHEXOL 180 MG/ML  SOLN
1.0000 mL | Freq: Once | INTRAMUSCULAR | Status: AC | PRN
  Administered 2014-07-08: 1 mL via EPIDURAL

## 2014-07-08 NOTE — Discharge Instructions (Signed)

## 2014-07-09 ENCOUNTER — Other Ambulatory Visit: Payer: Self-pay

## 2014-07-10 ENCOUNTER — Other Ambulatory Visit: Payer: Self-pay

## 2014-07-13 ENCOUNTER — Other Ambulatory Visit (HOSPITAL_COMMUNITY): Payer: Self-pay | Admitting: *Deleted

## 2014-07-13 DIAGNOSIS — F429 Obsessive-compulsive disorder, unspecified: Secondary | ICD-10-CM

## 2014-07-13 DIAGNOSIS — F411 Generalized anxiety disorder: Secondary | ICD-10-CM

## 2014-07-13 MED ORDER — SERTRALINE HCL 100 MG PO TABS: 100.0000 mg | ORAL_TABLET | Freq: Every day | ORAL | Status: DC

## 2014-07-13 NOTE — Telephone Encounter (Signed)
PT called for script refill for Zoloft 100mg .  Per Nena Polio, PA-C, pt is authorized for 1 refill for Zoloft 100mg , Qty 30. Pt has f/u appt on 2/19. Pt states and shows understanding.

## 2014-07-24 ENCOUNTER — Ambulatory Visit (INDEPENDENT_AMBULATORY_CARE_PROVIDER_SITE_OTHER): Payer: BLUE CROSS/BLUE SHIELD | Admitting: Physician Assistant

## 2014-07-24 ENCOUNTER — Telehealth: Payer: Self-pay | Admitting: Sports Medicine

## 2014-07-24 DIAGNOSIS — F411 Generalized anxiety disorder: Secondary | ICD-10-CM

## 2014-07-24 DIAGNOSIS — F429 Obsessive-compulsive disorder, unspecified: Secondary | ICD-10-CM

## 2014-07-24 DIAGNOSIS — F42 Obsessive-compulsive disorder: Secondary | ICD-10-CM

## 2014-07-24 MED ORDER — SERTRALINE HCL 25 MG PO TABS: 25.0000 mg | ORAL_TABLET | Freq: Every day | ORAL | Status: DC

## 2014-07-24 MED ORDER — SERTRALINE HCL 100 MG PO TABS: 100.0000 mg | ORAL_TABLET | Freq: Every day | ORAL | Status: DC

## 2014-07-24 NOTE — Progress Notes (Signed)
  [facility] [phone] Progress Note  Patty [name] [id number] 44 y.o.  07/24/2014 5:38 PM  Chief Complaint: GAD, and OCD.  History of Present Illness:    Suicidal Ideation: No Plan Formed: No Patient has means to carry out plan: No  Homicidal Ideation: No Plan Formed: No Patient has means to carry out plan: No  Review of Systems: Psychiatric: Agitation: No Hallucination: No Depressed Mood: No Insomnia: No Hypersomnia: No Altered Concentration: No Feels Worthless: No Grandiose Ideas: No  Belief In Special Powers: No New/Increased Substance Abuse: No Compulsions: No  Neurologic: Headache: No Seizure: No Paresthesias: No  Past Medical Family, Social History: no changes  Outpatient Encounter Prescriptions as of 07/24/2014  Medication Sig  . diclofenac (VOLTAREN) 75 MG EC tablet   . Ibuprofen-Famotidine 800-26.6 MG TABS Take 1 tablet by mouth 3 (three) times daily.  . Multiple Vitamin (MULTIVITAMIN) tablet Take 1 tablet by mouth.  . predniSONE (STERAPRED UNI-PAK) 10 MG tablet See admin instructions.  . sertraline (ZOLOFT) 100 MG tablet Take 1 tablet (100 mg total) by mouth daily.  . sertraline (ZOLOFT) 25 MG tablet Take 1 tablet (25 mg total) by mouth daily.    Past Psychiatric History/Hospitalization(s): Anxiety: Yes  0/10 Bipolar Disorder: No Depression: No Mania: No Psychosis: No Schizophrenia: No Personality Disorder: No Hospitalization for psychiatric illness: No History of Electroconvulsive Shock Therapy: No Prior Suicide Attempts: No  Physical Exam: Constitutional:  There were no vitals taken for this visit.  General Appearance: alert, oriented, no acute distress  Musculoskeletal: Strength & Muscle Tone: within normal limits Gait & Station: normal Patient leans: N/A  Psychiatric: Speech (describe rate, volume, coherence, spontaneity, and abnormalities if any): normal  Thought Process (describe rate, content, abstract  reasoning, and computation): normal  Associations: Coherent and Relevant  Thoughts: normal  Mental Status: Orientation: oriented to person, place and time/date Mood & Affect: normal affect Attention Span & Concentration: normal  Medical Decision Making (Choose Three): Established Problem, Stable/Improving (1)  Assessment: Chart review shows that she started feeling more anxious after she received her second round of steroids which is likely the cause of her increased anxiousness.  Axis I: GAD, OCD STABLE  Plan: 1. Will continue Zoloft at 125mg  for the next 2-3 months, then if she does not need another steroid injection, can lower it back to 100 with a goal of d/c it over the next several months. 2. Will recheck Vitamin D level. 3. She will start Vit. D3 2000 IU qd x 2, as well as B complex each day. 4. Follow up in 3-4 weeks for review of labs. 5 Call if needed.     Aloysuis Ribaudo, PA-C 07/24/2014

## 2014-07-24 NOTE — Telephone Encounter (Signed)
Patient called adv that she had an epidural in left back area and she is still hurting not getting any better. We did not have any openings for today I offered the next avail but she just asked me to call if we got any cancellations. Thanks

## 2014-07-24 NOTE — Telephone Encounter (Signed)
Called patient left a message on patient mvm to schedule appt to follow up from epidural. Patty [name]

## 2014-07-24 NOTE — Patient Instructions (Signed)
1. Continue all medication as ordered. 2. Call this office if you have any questions or concerns. 3. Continue to get regular exercise 3-5 times a week. 4. Continue to eat a healthy nutritionally balanced diet. 5. Continue to reduce stress and anxiety through activities such as yoga, mindfulness, meditation and or prayer. 6. Keep all appointments with your out patient therapist and have notes forwarded to this office. (If you do not have one and would like to be scheduled with a therapist, please let our office assist you with this. 7. Follow up as planned.  6 weeks

## 2014-07-27 ENCOUNTER — Ambulatory Visit (INDEPENDENT_AMBULATORY_CARE_PROVIDER_SITE_OTHER): Payer: BLUE CROSS/BLUE SHIELD | Admitting: Sports Medicine

## 2014-07-27 ENCOUNTER — Encounter: Payer: Self-pay | Admitting: Sports Medicine

## 2014-07-27 VITALS — BP 109/62 | HR 66 | Ht 68.0 in | Wt 143.0 lb

## 2014-07-27 DIAGNOSIS — S76912D Strain of unspecified muscles, fascia and tendons at thigh level, left thigh, subsequent encounter: Secondary | ICD-10-CM

## 2014-07-27 DIAGNOSIS — M5416 Radiculopathy, lumbar region: Secondary | ICD-10-CM

## 2014-07-27 DIAGNOSIS — S76812D Strain of other specified muscles, fascia and tendons at thigh level, left thigh, subsequent encounter: Secondary | ICD-10-CM

## 2014-07-27 DIAGNOSIS — M5417 Radiculopathy, lumbosacral region: Secondary | ICD-10-CM

## 2014-07-27 MED ORDER — IBUPROFEN-FAMOTIDINE 800-26.6 MG PO TABS: 1.0000 | ORAL_TABLET | Freq: Three times a day (TID) | ORAL | Status: DC

## 2014-07-27 NOTE — Progress Notes (Signed)
  Subjective:    CC: Follow-up after her epidural  HPI: Patty [name] returns, she has a broad-based L4-5 disc protrusion, she had a fantastic result to a right L4-L5 transforaminal epidural, subsequent she developed pain on the left side that she described as similar, so we did proceed with a left L4-L5 transforaminal epidural which unfortunately did not provide any relief whatsoever. On further questioning her pain is somewhat diffuse over the proximal left thigh, and worse with hip flexion, she only has mild pain in her back. Pain is moderate, persistent.  Past medical history, Surgical history, Family history not pertinant except as noted below, Social history, Allergies, and medications have been entered into the medical record, reviewed, and no changes needed.   Review of Systems: No fevers, chills, night sweats, weight loss, chest pain, or shortness of breath.   Objective:    General: Well Developed, well nourished, and in no acute distress.  Neuro: Alert and oriented x3, extra-ocular muscles intact, sensation grossly intact.  HEENT: Normocephalic, atraumatic, pupils equal round reactive to light, neck supple, no masses, no lymphadenopathy, thyroid nonpalpable.  Skin: Warm and dry, no rashes. Cardiac: Regular rate and rhythm, no murmurs rubs or gallops, no lower extremity edema.  Respiratory: Clear to auscultation bilaterally. Not using accessory muscles, speaking in full sentences. Left Hip: ROM IR: 60 Deg, ER: 60 Deg, Flexion: 120 Deg, Extension: 100 Deg, Abduction: 45 Deg, Adduction: 45 Deg Strength IR: 5/5, ER: 5/5, Flexion: 5/5, Extension: 5/5, Abduction: 5/5, Adduction: 5/5, there is reproduction of pain with resisted flexion of the hip. Pelvic alignment unremarkable to inspection and palpation. Standing hip rotation and gait without trendelenburg / unsteadiness. Greater trochanter without tenderness to palpation. No tenderness over piriformis. No SI joint tenderness and normal  minimal SI movement.  Impression and Recommendations:

## 2014-07-27 NOTE — Assessment & Plan Note (Signed)
Formal physical therapy for hip flexor rehabilitation. I was able to reproduce all of her pain with resisted hip flexion. Left sided L4-L5 transforaminal epidural was ineffective. At this point with her persistent pain. 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 Return to see me to go over MRI results. Next line refilling ibuprofen/famotidine.

## 2014-07-29 ENCOUNTER — Telehealth: Payer: Self-pay

## 2014-07-29 ENCOUNTER — Ambulatory Visit: Payer: Self-pay | Admitting: Sports Medicine

## 2014-07-29 NOTE — Telephone Encounter (Signed)
PA required for MR pelvis without contrast - Approved - [id number] - expires 08/27/2014

## 2014-07-30 ENCOUNTER — Ambulatory Visit (HOSPITAL_COMMUNITY)
Admission: RE | Admit: 2014-07-30 | Discharge: 2014-07-30 | Disposition: A | Discharge status: Home / Self Care | Payer: BLUE CROSS/BLUE SHIELD | Source: Ambulatory Visit | Attending: Sports Medicine | Admitting: Sports Medicine

## 2014-07-30 ENCOUNTER — Ambulatory Visit: Payer: Self-pay | Admitting: Sports Medicine

## 2014-07-30 DIAGNOSIS — R102 Pelvic and perineal pain: Secondary | ICD-10-CM | POA: Insufficient documentation

## 2014-07-30 DIAGNOSIS — M25552 Pain in left hip: Secondary | ICD-10-CM | POA: Insufficient documentation

## 2014-07-30 DIAGNOSIS — S76912D Strain of unspecified muscles, fascia and tendons at thigh level, left thigh, subsequent encounter: Secondary | ICD-10-CM

## 2014-07-31 ENCOUNTER — Encounter: Payer: Self-pay | Admitting: Sports Medicine

## 2014-08-06 ENCOUNTER — Encounter: Payer: Self-pay | Admitting: Sports Medicine

## 2014-08-13 ENCOUNTER — Ambulatory Visit: Payer: BLUE CROSS/BLUE SHIELD | Admitting: Sports Medicine

## 2014-08-14 ENCOUNTER — Ambulatory Visit (INDEPENDENT_AMBULATORY_CARE_PROVIDER_SITE_OTHER): Payer: BLUE CROSS/BLUE SHIELD | Admitting: Sports Medicine

## 2014-08-14 ENCOUNTER — Encounter: Payer: Self-pay | Admitting: Sports Medicine

## 2014-08-14 VITALS — BP 109/68 | HR 80 | Wt 140.0 lb

## 2014-08-14 DIAGNOSIS — M84359D Stress fracture, hip, unspecified, subsequent encounter for fracture with routine healing: Secondary | ICD-10-CM

## 2014-08-14 DIAGNOSIS — M84353D Stress fracture, unspecified femur, subsequent encounter for fracture with routine healing: Secondary | ICD-10-CM

## 2014-08-14 NOTE — Assessment & Plan Note (Signed)
Compression sided left femoral neck stress fracture, doing extremely well now she has been out of running. She is going to do a calcium and vitamin D supplement every day, twice a day, she will work on hip girdle strengthening exercises in the meantime, and avoid running. I would like to see her back for custom orthotics, and we will keep her out of all running for a full 2 months.

## 2014-08-14 NOTE — Progress Notes (Signed)
  Subjective:    CC: MRI results  HPI: Patty [name] returns, she is a pleasant 44 year old female runner, unfortunately she has had left hip pain for some time now, we tried multiple modalities including physical therapy, and even a lumbar epidural but unfortunately she continued to have pain. We then obtained an MRI of her pelvis which confirmed a compression sided femoral neck stress fracture. Since then I have asked her to completely stay out of all running, for at least 1-2 months. She returns to see me today completely pain-free, she has canceled all of her races. Wonders if she can do nonimpact activities. Walking itself is nonpainful.  Past medical history, Surgical history, Family history not pertinant except as noted below, Social history, Allergies, and medications have been entered into the medical record, reviewed, and no changes needed.   Review of Systems: No fevers, chills, night sweats, weight loss, chest pain, or shortness of breath.   Objective:    General: Well Developed, well nourished, and in no acute distress.  Neuro: Alert and oriented x3, extra-ocular muscles intact, sensation grossly intact.  HEENT: Normocephalic, atraumatic, pupils equal round reactive to light, neck supple, no masses, no lymphadenopathy, thyroid nonpalpable.  Skin: Warm and dry, no rashes. Cardiac: Regular rate and rhythm, no murmurs rubs or gallops, no lower extremity edema.  Respiratory: Clear to auscultation bilaterally. Not using accessory muscles, speaking in full sentences. Left Hip: ROM IR: 60 Deg, ER: 60 Deg, Flexion: 120 Deg, Extension: 100 Deg, Abduction: 45 Deg, Adduction: 45 Deg Strength IR: 5/5, ER: 5/5, Flexion: 5/5, Extension: 5/5, Abduction: 5/5, Adduction: 5/5 Pelvic alignment unremarkable to inspection and palpation. Standing hip rotation and gait without trendelenburg / unsteadiness. Greater trochanter without tenderness to palpation. No tenderness over piriformis. No SI joint  tenderness and normal minimal SI movement.  We reviewed the MRI images together, she does have fairly intense edema on the inferior aspect of the left femoral neck extending into the metastasis but not into the head. There is no evidence of any lesion on the tension side of the femoral neck.  Impression and Recommendations:

## 2014-08-17 DIAGNOSIS — S72009A Fracture of unspecified part of neck of unspecified femur, initial encounter for closed fracture: Secondary | ICD-10-CM | POA: Insufficient documentation

## 2014-09-04 ENCOUNTER — Ambulatory Visit (HOSPITAL_COMMUNITY): Payer: Self-pay | Admitting: Physician Assistant

## 2014-09-07 ENCOUNTER — Encounter: Payer: Self-pay | Admitting: Sports Medicine

## 2014-09-30 ENCOUNTER — Encounter (HOSPITAL_COMMUNITY): Payer: Self-pay | Admitting: Physician Assistant

## 2014-09-30 ENCOUNTER — Ambulatory Visit (INDEPENDENT_AMBULATORY_CARE_PROVIDER_SITE_OTHER): Payer: BLUE CROSS/BLUE SHIELD | Admitting: Physician Assistant

## 2014-09-30 VITALS — BP 105/63 | HR 63 | Ht 66.0 in | Wt 142.0 lb

## 2014-09-30 DIAGNOSIS — F42 Obsessive-compulsive disorder: Secondary | ICD-10-CM | POA: Diagnosis not present

## 2014-09-30 DIAGNOSIS — F411 Generalized anxiety disorder: Secondary | ICD-10-CM

## 2014-09-30 DIAGNOSIS — F429 Obsessive-compulsive disorder, unspecified: Secondary | ICD-10-CM

## 2014-09-30 MED ORDER — SERTRALINE HCL 25 MG PO TABS: 25.0000 mg | ORAL_TABLET | Freq: Every day | ORAL | Status: DC

## 2014-09-30 MED ORDER — SERTRALINE HCL 100 MG PO TABS: 100.0000 mg | ORAL_TABLET | Freq: Every day | ORAL | Status: DC

## 2014-09-30 NOTE — Progress Notes (Signed)
  [facility] [phone] Progress Note  Patty [name] [id number] 44 y.o.  09/30/2014 8:38 AM  Chief Complaint: GAD, and OCD.  History of Present Illness: Patty [name] presents today to follow up on her GAD and OCD. She notes in the interim she has been diagnosed with a hip stress fracture and is now on hold for exercise. This has been frustrating for her as exercise helps her to reduce stress.  She has been keeping busy, has no new symptoms, and taking this news in stride. She is hopeful to return to running as planned with careful follow up.  She is counting the days May 18 until her follow up and is in positive anticipation for this.  She wants to get 90 days of medication on this refill and has been compliant with taking it as needed. The patient wants to plan on a structured decrease of the medication after she is stable and exercising without issues to discontinue this medication.   Suicidal Ideation: No Plan Formed: No Patient has means to carry out plan: No  Homicidal Ideation: No Plan Formed: No Patient has means to carry out plan: No  Review of Systems: Psychiatric: Agitation: No Hallucination: No Depressed Mood: No Insomnia: No Hypersomnia: No Altered Concentration: No Feels Worthless: No Grandiose Ideas: No  Belief In Special Powers: No New/Increased Substance Abuse: No Compulsions: No  Neurologic: Headache: No Seizure: No Paresthesias: No  Past Medical Family, Social History: patient is keeping busy and coping well.  Outpatient Encounter Prescriptions as of 09/30/2014  Medication Sig  . Multiple Vitamin (MULTIVITAMIN) tablet Take 1 tablet by mouth.  . sertraline (ZOLOFT) 100 MG tablet Take 1 tablet (100 mg total) by mouth daily.  . sertraline (ZOLOFT) 25 MG tablet Take 1 tablet (25 mg total) by mouth daily.    Past Psychiatric History/Hospitalization(s): Anxiety: Yes  0/10 Bipolar Disorder: No Depression: No Mania: No Psychosis: No Schizophrenia:  No Personality Disorder: No Hospitalization for psychiatric illness: No History of Electroconvulsive Shock Therapy: No Prior Suicide Attempts: No  Physical Exam: Constitutional:  BP 105/63 mmHg  Pulse 63  Ht 5\' 6"  (1.676 m)  Wt 142 lb (64.411 kg)  BMI 22.93 kg/m2  General Appearance: alert, oriented, no acute distress  Musculoskeletal: Strength & Muscle Tone: within normal limits Gait & Station: normal Patient leans: N/A  Psychiatric: Speech (describe rate, volume, coherence, spontaneity, and abnormalities if any): normal  Thought Process (describe rate, content, abstract reasoning, and computation): normal  Associations: Coherent and Relevant  Thoughts: normal  Language: normal and appropriate.  Fund of knowledge: above average   Mental Status: Orientation: oriented to person, place and time/date Mood & Affect: normal affect Attention Span & Concentration: normal  Medical Decision Making (Choose Three): Established Problem, Stable/Improving (1)  Assessment: Patient is doing very well and has no new complaints.  Axis I: GAD, OCD STABLE  Plan: 1. Will continue Zoloft at 125mg  for the next 3 months, then will start structured plan to decrease medication over time in a structured reduction. 4. Follow up in 3 months for refill.  5 Call if needed.     [name], PA-C 09/30/2014

## 2014-09-30 NOTE — Patient Instructions (Signed)
1. Take all of your medications as discussed with your provider. (Please check your AVS, for the list.) 2. Call this office for any questions or problems. 3. Be sure to get plenty of rest and try for 7-9 hours of quality sleep each night. 4. Try to get regular exercise, at least 15-30 minutes each day.  A good walk will help tremendously! 5. Remember to do your mindfulness each day, breath deeply in and out, while having quiet reflection, prayer, meditation, or positive visualization. Unplug and turn off all electronic devices each day for your own personal time without interruption. This works! There are studies to back this up! 6. Be sure to take your B complex and Vitamin D3 each day. This will improve your overall wellbeing and boost your immune system as well. 7. Try to eat a nutritious healthy diet and avoid excessive alcohol and ALL tobacco products. 8. Be sure to keep all of your appointments with your outpatient therapist. If you do not have one, our office will be happy to assist you with this. 9. Be sure to keep your next follow up appointment in 3 months. 

## 2014-10-07 ENCOUNTER — Encounter: Payer: Self-pay | Admitting: Sports Medicine

## 2014-10-16 ENCOUNTER — Encounter: Payer: Self-pay | Admitting: Sports Medicine

## 2014-12-02 ENCOUNTER — Other Ambulatory Visit (HOSPITAL_COMMUNITY): Payer: Self-pay | Admitting: Orthopedic Surgery

## 2014-12-02 DIAGNOSIS — F430 Acute stress reaction: Secondary | ICD-10-CM

## 2014-12-10 ENCOUNTER — Ambulatory Visit (HOSPITAL_COMMUNITY)
Admission: RE | Admit: 2014-12-10 | Discharge: 2014-12-10 | Disposition: A | Discharge status: Home / Self Care | Payer: BLUE CROSS/BLUE SHIELD | Source: Ambulatory Visit | Attending: Orthopedic Surgery | Admitting: Orthopedic Surgery

## 2014-12-10 DIAGNOSIS — M25552 Pain in left hip: Secondary | ICD-10-CM | POA: Diagnosis not present

## 2014-12-10 DIAGNOSIS — F430 Acute stress reaction: Secondary | ICD-10-CM

## 2014-12-28 ENCOUNTER — Telehealth (HOSPITAL_COMMUNITY): Payer: Self-pay

## 2014-12-28 NOTE — Telephone Encounter (Signed)
error 

## 2014-12-29 ENCOUNTER — Encounter (HOSPITAL_COMMUNITY): Payer: Self-pay | Admitting: Psychiatry

## 2014-12-29 ENCOUNTER — Ambulatory Visit (INDEPENDENT_AMBULATORY_CARE_PROVIDER_SITE_OTHER): Payer: BLUE CROSS/BLUE SHIELD | Admitting: Psychiatry

## 2014-12-29 VITALS — BP 120/64 | HR 60 | Ht 66.0 in | Wt 142.8 lb

## 2014-12-29 DIAGNOSIS — F429 Obsessive-compulsive disorder, unspecified: Secondary | ICD-10-CM

## 2014-12-29 DIAGNOSIS — F42 Obsessive-compulsive disorder: Secondary | ICD-10-CM

## 2014-12-29 DIAGNOSIS — F411 Generalized anxiety disorder: Secondary | ICD-10-CM | POA: Diagnosis not present

## 2014-12-29 MED ORDER — SERTRALINE HCL 100 MG PO TABS: 100.0000 mg | ORAL_TABLET | Freq: Every day | ORAL | Status: DC

## 2014-12-29 MED ORDER — SERTRALINE HCL 25 MG PO TABS: ORAL_TABLET | ORAL | Status: DC

## 2014-12-29 NOTE — Progress Notes (Signed)
Patient ID: Patty [name], female   DOB: 15-Sep-1970, 44 y.o.   MRN: [id number]  [facility] [id number] Progress Note  Patty [name] [id number] 44 y.o.  12/29/2014 11:18 AM  Chief Complaint: GAD, and OCD.  History of Present Illness: Patty [name] presents today to follow up on her GAD and OCD.  History is suggestive of anxiety and OCD symptoms exacerbate under stress. In past it was related with relationship issues which is resolved now with her husband.  Recent stress was surgery. Dose was increased 125mg  of zoloft.  Now she wants to slow taper down. Worries are not excessive Sleep is adequate. Anxiety is 3/10. 10 being extreme Modifying factors; exercise and running    She wants to get 90 days of medication on this refill and has been compliant with taking it as needed. The patient wants to plan on a structured decrease of the medication after she is stable and exercising without issues to discontinue this medication.   Suicidal Ideation: No Plan Formed: No Patient has means to carry out plan: No  Homicidal Ideation: No Plan Formed: No Patient has means to carry out plan: No  Review of Systems: Psychiatric: Agitation: No Hallucination: No Depressed Mood: No Insomnia: No Hypersomnia: No Altered Concentration: No Feels Worthless: No Grandiose Ideas: No  Belief In Special Powers: No New/Increased Substance Abuse: No Compulsions: No  Neurologic: Headache: No Seizure: No Paresthesias: No  Past Medical Family, Social History: patient is keeping busy and coping well.  Outpatient Encounter Prescriptions as of 12/29/2014  Medication Sig  . Multiple Vitamin (MULTIVITAMIN) tablet Take 1 tablet by mouth.  . sertraline (ZOLOFT) 100 MG tablet Take 1 tablet (100 mg total) by mouth daily.  . sertraline (ZOLOFT) 25 MG tablet Take half tablet a day for next 7 to 10 days and then stop.  Continue taking 100mg .  . [DISCONTINUED] sertraline (ZOLOFT) 100 MG tablet Take 1  tablet (100 mg total) by mouth daily.  . [DISCONTINUED] sertraline (ZOLOFT) 25 MG tablet Take 1 tablet (25 mg total) by mouth daily.   No facility-administered encounter medications on file as of 12/29/2014.    Past Psychiatric History/Hospitalization(s): Anxiety: Yes  0/10 Bipolar Disorder: No Depression: No Mania: No Psychosis: No Schizophrenia: No Personality Disorder: No Hospitalization for psychiatric illness: No History of Electroconvulsive Shock Therapy: No Prior Suicide Attempts: No  ROS: denies nausea, headache. No tremors  Physical Exam: Constitutional:  BP 120/64 mmHg  Pulse 60  Ht 5\' 6"  (1.676 m)  Wt 142 lb 12.8 oz (64.774 kg)  BMI 23.06 kg/m2  SpO2 95%  General Appearance: alert, oriented, no acute distress  Musculoskeletal: Strength & Muscle Tone: within normal limits Gait & Station: normal Patient leans: N/A  Psychiatric: Speech (describe rate, volume, coherence, spontaneity, and abnormalities if any): normal  Thought Process (describe rate, content, abstract reasoning, and computation): normal  Associations: Coherent and Relevant  Thoughts: normal  Language: normal and appropriate.  Fund of knowledge: above average   Mental Status: Orientation: oriented to person, place and time/date Mood & Affect: normal affect Attention Span & Concentration: normal  Medical Decision Making (Choose Three): Established Problem, Stable/Improving (1)  Assessment: Patient is doing baseline on meds.   Axis I: GAD, OCD STABLE  Plan: 1. Cut down zoloft to 100mg . Slow taper off the 25mg  She wants to taper down further but we talked to do it later in spring next year since she has been on this med for more then 5 years   Call  if needed.or report to ER for any urgent concern.      Merian Capron, MD 12/29/2014

## 2014-12-30 ENCOUNTER — Ambulatory Visit (HOSPITAL_COMMUNITY): Payer: Self-pay | Admitting: Physician Assistant

## 2015-01-25 LAB — HM PAP SMEAR: HM PAP: NORMAL

## 2015-02-01 ENCOUNTER — Ambulatory Visit (INDEPENDENT_AMBULATORY_CARE_PROVIDER_SITE_OTHER): Payer: BLUE CROSS/BLUE SHIELD | Admitting: Sports Medicine

## 2015-02-01 ENCOUNTER — Encounter: Payer: Self-pay | Admitting: Sports Medicine

## 2015-02-01 VITALS — BP 106/62 | HR 55 | Ht 67.5 in | Wt 142.0 lb

## 2015-02-01 DIAGNOSIS — M7662 Achilles tendinitis, left leg: Secondary | ICD-10-CM

## 2015-02-01 DIAGNOSIS — M7661 Achilles tendinitis, right leg: Secondary | ICD-10-CM

## 2015-02-01 DIAGNOSIS — M79675 Pain in left toe(s): Secondary | ICD-10-CM | POA: Insufficient documentation

## 2015-02-01 DIAGNOSIS — M6788 Other specified disorders of synovium and tendon, other site: Secondary | ICD-10-CM | POA: Insufficient documentation

## 2015-02-01 MED ORDER — CELECOXIB 200 MG PO CAPS: ORAL_CAPSULE | ORAL | Status: DC

## 2015-02-01 NOTE — Assessment & Plan Note (Signed)
Left second PIP synovitis. Buddy taped second and third toes, x-rays are negative, adding Celebrex.

## 2015-02-01 NOTE — Progress Notes (Signed)
   Subjective:    I'm seeing this patient as a consultation for:  Orthopedic urgent care  CC: Toe pain, Achilles pain  HPI: Left toe pain: Left second proximal phalangeal joint, currently training for a marathon and running 16 miles per week, for the past week she's had increasing pain and swelling that she localizes to the PIP, no bruising, no trauma. No constitutional symptoms. X-rays were done at orthopedic urgent care that were negative.  Bilateral heel pain: Localized at the mid Achilles, moderate, persistent, she did recently move to a different shoe with a greater heel drop.  Past medical history, Surgical history, Family history not pertinant except as noted below, Social history, Allergies, and medications have been entered into the medical record, reviewed, and no changes needed.   Review of Systems: No headache, visual changes, nausea, vomiting, diarrhea, constipation, dizziness, abdominal pain, skin rash, fevers, chills, night sweats, weight loss, swollen lymph nodes, body aches, joint swelling, muscle aches, chest pain, shortness of breath, mood changes, visual or auditory hallucinations.   Objective:   General: Well Developed, well nourished, and in no acute distress.  Neuro/Psych: Alert and oriented x3, extra-ocular muscles intact, able to move all 4 extremities, sensation grossly intact. Skin: Warm and dry, no rashes noted.  Respiratory: Not using accessory muscles, speaking in full sentences, trachea midline.  Cardiovascular: Pulses palpable, no extremity edema. Abdomen: Does not appear distended. Left Foot: No visible erythema or swelling. Range of motion is full in all directions. Strength is 5/5 in all directions. No hallux valgus. No pes cavus or pes planus. No abnormal callus noted. No pain over the navicular prominence, or base of fifth metatarsal. No tenderness to palpation of the calcaneal insertion of plantar fascia. No pain at the Achilles insertion. No  pain over the calcaneal bursa. No pain of the retrocalcaneal bursa. No tenderness to palpation over the tarsals, metatarsals, or phalanges. No hallux rigidus or limitus. Tender to palpation directly over the left second proximal interphalangeal joint with minimal swelling. No pain with compression of the metatarsal heads. Tenderness over the Achilles bilaterally, Achilles, without nodules. Neurovascularly intact distally.  I inspected her orthotics, they were a bit thick in the midfoot, these were created by another provider, and modify them, and we also added heel lift for her Achilles.  Impression and Recommendations:   This case required medical decision making of moderate complexity.

## 2015-02-01 NOTE — Assessment & Plan Note (Signed)
Bilateral heel lift. Thinned out orthotics made by another provider.

## 2015-02-05 ENCOUNTER — Telehealth: Payer: Self-pay | Admitting: Family Medicine

## 2015-02-05 NOTE — Telephone Encounter (Signed)
Received fax for prior authorization on Celecoxib 200 sent through cover my meds waiting on authorization. - CF

## 2015-02-09 NOTE — Telephone Encounter (Signed)
Received fax from [place] is approved.  Reference # [id number] - CF

## 2015-02-22 ENCOUNTER — Ambulatory Visit: Payer: Self-pay | Admitting: Sports Medicine

## 2015-02-23 ENCOUNTER — Ambulatory Visit: Payer: Self-pay | Admitting: Sports Medicine

## 2015-02-25 ENCOUNTER — Ambulatory Visit (INDEPENDENT_AMBULATORY_CARE_PROVIDER_SITE_OTHER): Payer: BLUE CROSS/BLUE SHIELD | Admitting: Sports Medicine

## 2015-02-25 ENCOUNTER — Encounter: Payer: Self-pay | Admitting: Sports Medicine

## 2015-02-25 VITALS — BP 105/49 | HR 51 | Wt 143.0 lb

## 2015-02-25 DIAGNOSIS — M7661 Achilles tendinitis, right leg: Secondary | ICD-10-CM

## 2015-02-25 DIAGNOSIS — M7662 Achilles tendinitis, left leg: Secondary | ICD-10-CM

## 2015-02-25 DIAGNOSIS — M79675 Pain in left toe(s): Secondary | ICD-10-CM | POA: Diagnosis not present

## 2015-02-25 NOTE — Assessment & Plan Note (Signed)
Improving significantly. She did discontinue the 0 drop shoes. I would like her to continue with Achilles rehabilitation exercises, and we can consider nitroglycerin if not starting to improve at the next visit.

## 2015-02-25 NOTE — Assessment & Plan Note (Signed)
Improving significantly. She does use Duexis which provides good relief. Symptoms do resemble Morton's neuroma with electric-type sensations shooting into the toe, and she does have minimal breakdown of the transverse arch. I would like her to add metatarsal pads into her orthotics. Return in one month.

## 2015-02-25 NOTE — Progress Notes (Signed)
  Subjective:    CC: follow-up  HPI: Bilateral Achilles tendinosis: Continues to improve since discontinuing her 0 drop shoes.  Toe pain: Continues to improve.  Past medical history, Surgical history, Family history not pertinant except as noted below, Social history, Allergies, and medications have been entered into the medical record, reviewed, and no changes needed.   Review of Systems: No fevers, chills, night sweats, weight loss, chest pain, or shortness of breath.   Objective:    General: Well Developed, well nourished, and in no acute distress.  Neuro: Alert and oriented x3, extra-ocular muscles intact, sensation grossly intact.  HEENT: Normocephalic, atraumatic, pupils equal round reactive to light, neck supple, no masses, no lymphadenopathy, thyroid nonpalpable.  Skin: Warm and dry, no rashes. Cardiac: Regular rate and rhythm, no murmurs rubs or gallops, no lower extremity edema.  Respiratory: Clear to auscultation bilaterally. Not using accessory muscles, speaking in full sentences. Left Foot: No visible erythema or swelling. Minimal drop of the short arch with a small mount of abnormal callus of the second through fourth metatarsal heads Range of motion is full in all directions. Strength is 5/5 in all directions. No hallux valgus. No pes cavus or pes planus. No abnormal callus noted. No pain over the navicular prominence, or base of fifth metatarsal. No tenderness to palpation of the calcaneal insertion of plantar fascia. No pain at the Achilles insertion. No pain over the calcaneal bursa. No pain of the retrocalcaneal bursa. No tenderness to palpation over the tarsals, metatarsals, or phalanges. No hallux rigidus or limitus. No tenderness palpation over interphalangeal joints. No pain with compression of the metatarsal heads. Neurovascularly intact distally.  Impression and Recommendations:    I spent 25 minutes with this patient, greater than 50% was  face-to-face time counseling regarding the above diagnoses

## 2015-03-01 ENCOUNTER — Ambulatory Visit (HOSPITAL_COMMUNITY): Payer: Self-pay | Admitting: Psychiatry

## 2015-03-05 ENCOUNTER — Ambulatory Visit (INDEPENDENT_AMBULATORY_CARE_PROVIDER_SITE_OTHER): Payer: BLUE CROSS/BLUE SHIELD | Admitting: Psychiatry

## 2015-03-05 ENCOUNTER — Encounter (HOSPITAL_COMMUNITY): Payer: Self-pay | Admitting: Psychiatry

## 2015-03-05 DIAGNOSIS — F42 Obsessive-compulsive disorder: Secondary | ICD-10-CM

## 2015-03-05 DIAGNOSIS — E559 Vitamin D deficiency, unspecified: Secondary | ICD-10-CM | POA: Diagnosis not present

## 2015-03-05 DIAGNOSIS — F429 Obsessive-compulsive disorder, unspecified: Secondary | ICD-10-CM

## 2015-03-05 DIAGNOSIS — F411 Generalized anxiety disorder: Secondary | ICD-10-CM | POA: Diagnosis not present

## 2015-03-05 MED ORDER — SERTRALINE HCL 100 MG PO TABS: 100.0000 mg | ORAL_TABLET | Freq: Every day | ORAL | Status: DC

## 2015-03-05 NOTE — Progress Notes (Signed)
Patient ID: Patty [name], female   DOB: August 29, 1970, 44 y.o.   MRN: [id number]  [facility] [address] Progress Note  Patty [name] [id number] 44 y.o.  03/05/2015 12:23 PM  Chief Complaint: GAD, and OCD.  History of Present Illness: Patty [name] presents today to follow up on her GAD and OCD.  History is suggestive of anxiety and OCD symptoms exacerbate under stress. In past it was related with relationship issues which is resolved now with her husband.  Patient has tolerated Zoloft her milligram. 25 mg was cut down. She is motivated and she is currently running a [place] she is getting prepared for that  she continue wants to slow taper down but may wait for sping. Worries are not excessive Sleep is adequate. Anxiety is 3/10. 10 being extreme Modifying factors; exercise and running  aggravatign factor: marital problem in past  She wants to get 90 days of medication on this refill and has been compliant with taking it as needed. The patient wants to plan on a structured decrease of the medication after she is stable and exercising without issues to discontinue this medication.   Suicidal Ideation: No Plan Formed: No Patient has means to carry out plan: No  Homicidal Ideation: No Plan Formed: No Patient has means to carry out plan: No  Review of Systems: Psychiatric: Agitation: No Hallucination: No Depressed Mood: No Insomnia: No Hypersomnia: No Altered Concentration: No Feels Worthless: No Grandiose Ideas: No  Belief In Special Powers: No New/Increased Substance Abuse: No Compulsions: No  Neurologic: Headache: No Seizure: No Paresthesias: No  Past Medical Family, Social History: patient is keeping busy and coping well.  Outpatient Encounter Prescriptions as of 03/05/2015  Medication Sig  . celecoxib (CELEBREX) 200 MG capsule One to 2 tablets by mouth daily as needed for pain. (Patient not taking: Reported on 02/25/2015)  . Multiple Vitamin  (MULTIVITAMIN) tablet Take 1 tablet by mouth.  . sertraline (ZOLOFT) 100 MG tablet Take 1 tablet (100 mg total) by mouth daily.  . [DISCONTINUED] sertraline (ZOLOFT) 100 MG tablet Take 1 tablet (100 mg total) by mouth daily.  . [DISCONTINUED] sertraline (ZOLOFT) 25 MG tablet Take half tablet a day for next 7 to 10 days and then stop.  Continue taking 100mg .   No facility-administered encounter medications on file as of 03/05/2015.    Past Psychiatric History/Hospitalization(s): Anxiety: Yes  0/10 Bipolar Disorder: No Depression: No Mania: No Psychosis: No Schizophrenia: No Personality Disorder: No Hospitalization for psychiatric illness: No History of Electroconvulsive Shock Therapy: No Prior Suicide Attempts: No  ROS: denies nausea, headache. No tremors  Physical Exam: Constitutional:  There were no vitals taken for this visit.  General Appearance: alert, oriented, no acute distress  Musculoskeletal: Strength & Muscle Tone: within normal limits Gait & Station: normal Patient leans: N/A  Psychiatric: Speech (describe rate, volume, coherence, spontaneity, and abnormalities if any): normal  Thought Process (describe rate, content, abstract reasoning, and computation): normal  Associations: Coherent and Relevant  Thoughts: normal  Language: normal and appropriate.  Fund of knowledge: above average   Mental Status: Orientation: oriented to person, place and time/date Mood & Affect: normal affect Attention Span & Concentration: normal  Medical Decision Making (Choose Three): Established Problem, Stable/Improving (1)  Assessment: Patient is doing baseline on meds.   Axis I: GAD, OCD STABLE  Plan: 1. Continue zoloft to 100mg  for anxiety and OCD.  Will taper down further next spring.  Vitamin d: she takes calcium and follow  up with provider Back pain : does not endorse pain as of now.  More than 50% spent in counseling and coordination. Including patient  education  Call if needed.or report to ER for any urgent concern.   Time spent : 25 minutes    Merian Capron, MD 03/05/2015

## 2015-03-29 ENCOUNTER — Ambulatory Visit (INDEPENDENT_AMBULATORY_CARE_PROVIDER_SITE_OTHER): Payer: BLUE CROSS/BLUE SHIELD | Admitting: Sports Medicine

## 2015-03-29 ENCOUNTER — Encounter: Payer: Self-pay | Admitting: Sports Medicine

## 2015-03-29 VITALS — BP 136/57 | HR 58 | Ht 67.5 in | Wt 142.0 lb

## 2015-03-29 DIAGNOSIS — M25561 Pain in right knee: Secondary | ICD-10-CM | POA: Insufficient documentation

## 2015-03-29 DIAGNOSIS — M7652 Patellar tendinitis, left knee: Secondary | ICD-10-CM | POA: Diagnosis not present

## 2015-03-29 DIAGNOSIS — M79675 Pain in left toe(s): Secondary | ICD-10-CM

## 2015-03-29 NOTE — Assessment & Plan Note (Addendum)
Single episode during a run, has not happened since. We will treat this conservatively with a Cho-Pat strap, and patellar tendinitis rehabilitation exercises. She will also move the seat of her stationary bike higher to decrease knee flexion. She is currently at 18 mile runs training for a marathon in November.

## 2015-03-29 NOTE — Assessment & Plan Note (Signed)
Most likely related toa Morton's neuroma, resolved with metatarsal pads which have since been removed.

## 2015-03-29 NOTE — Progress Notes (Signed)
  Subjective:    CC: follow-up  HPI: Toe pain: Clinically diagnosed as a Morton's neuroma, resolved with placement of neuroma pads in her custom orthotics, symptoms all resolved and so she has removed the neuroma pads. Unfortunately she is developing a callus on the lateral aspect of her right third toe, and has started wearing toe socks which helped significantly.  Left knee pain: Single episode that occurred recently during an 18 mile run, localized just at the inferior pole of the patella, significant pain which resolved quickly. This is not happened since however she is currently training for a marathon in November, and is understandably concerned.  Past medical history, Surgical history, Family history not pertinant except as noted below, Social history, Allergies, and medications have been entered into the medical record, reviewed, and no changes needed.   Review of Systems: No fevers, chills, night sweats, weight loss, chest pain, or shortness of breath.   Objective:    General: Well Developed, well nourished, and in no acute distress.  Neuro: Alert and oriented x3, extra-ocular muscles intact, sensation grossly intact.  HEENT: Normocephalic, atraumatic, pupils equal round reactive to light, neck supple, no masses, no lymphadenopathy, thyroid nonpalpable.  Skin: Warm and dry, no rashes. Cardiac: Regular rate and rhythm, no murmurs rubs or gallops, no lower extremity edema.  Respiratory: Clear to auscultation bilaterally. Not using accessory muscles, speaking in full sentences. Left Knee: Normal to inspection with no erythema or effusion or obvious bony abnormalities. Palpation normal with no warmth or joint line tenderness or patellar tenderness or condyle tenderness. ROM normal in flexion and extension and lower leg rotation. Ligaments with solid consistent endpoints including ACL, PCL, LCL, MCL. Negative Mcmurray's and provocative meniscal tests. Non painful patellar  compression. Minimal if any tenderness at the inferior patellar pole/origin of the patellar tendon. Hamstring and quadriceps strength is normal.  Impression and Recommendations:

## 2015-04-01 ENCOUNTER — Telehealth: Payer: Self-pay

## 2015-04-01 NOTE — Telephone Encounter (Signed)
I can add topical nitroglycerin patches, and has she discontinued her zero-drop shoes? If so, we can add heel lifts for her to run in for now. Achilles tendinosis is common in marathon runners, but it does take a long time to heal.

## 2015-04-01 NOTE — Telephone Encounter (Signed)
PATIENT CALLED STATED THAT SHE NEEDS ADVISE ON WHAT SHE SHOULD DO WITH HER CALF PAIN, SHE STATED THAT SHE HAS DONE COMPRESSION AND ICE AND HAS DONE MASSAGE AND IT IS NOT HELPING. PLEASE ADVISE PATIENT. Talbot Monarch,CMA

## 2015-04-05 NOTE — Telephone Encounter (Signed)
Patient informed.she stated that she is not having pain at the moment but she will call and schedule an appointment if she starts to hurt again. Oneta Rack  ,Rhonda Cunningham,CMA

## 2015-04-26 ENCOUNTER — Ambulatory Visit: Payer: Self-pay | Admitting: Sports Medicine

## 2015-04-27 ENCOUNTER — Ambulatory Visit: Payer: Self-pay | Admitting: Sports Medicine

## 2015-05-11 ENCOUNTER — Encounter: Payer: Self-pay | Admitting: Sports Medicine

## 2015-05-14 ENCOUNTER — Encounter: Payer: Self-pay | Admitting: Sports Medicine

## 2015-05-14 ENCOUNTER — Ambulatory Visit (INDEPENDENT_AMBULATORY_CARE_PROVIDER_SITE_OTHER): Payer: BLUE CROSS/BLUE SHIELD | Admitting: Sports Medicine

## 2015-05-14 VITALS — BP 126/56 | HR 69 | Temp 98.9°F | Resp 16 | Wt 142.6 lb

## 2015-05-14 DIAGNOSIS — M2170 Unequal limb length (acquired), unspecified site: Secondary | ICD-10-CM

## 2015-05-14 NOTE — Progress Notes (Signed)

## 2015-05-14 NOTE — Assessment & Plan Note (Signed)
Custom orthotics as above. She did just finish the knee or proceed marathon and plans to do [place], [place], [place], and [place] next.

## 2015-06-11 ENCOUNTER — Encounter (HOSPITAL_COMMUNITY): Payer: Self-pay | Admitting: Psychiatry

## 2015-06-11 ENCOUNTER — Ambulatory Visit (INDEPENDENT_AMBULATORY_CARE_PROVIDER_SITE_OTHER): Payer: BLUE CROSS/BLUE SHIELD | Admitting: Psychiatry

## 2015-06-11 VITALS — BP 118/62 | HR 58 | Ht 67.5 in | Wt 143.0 lb

## 2015-06-11 DIAGNOSIS — F429 Obsessive-compulsive disorder, unspecified: Secondary | ICD-10-CM

## 2015-06-11 DIAGNOSIS — F411 Generalized anxiety disorder: Secondary | ICD-10-CM | POA: Diagnosis not present

## 2015-06-11 MED ORDER — SERTRALINE HCL 50 MG PO TABS: 100.0000 mg | ORAL_TABLET | Freq: Every day | ORAL | Status: DC

## 2015-06-11 NOTE — Progress Notes (Signed)
Patient ID: Patty [name], female   DOB: Jan 18, 1971, 45 y.o.   MRN: [id number]  [facility] [address] Progress Note  Patty [name] [id number] 45 y.o.  06/11/2015 12:18 PM  Chief Complaint: GAD, and OCD.  History of Present Illness: Patty [name] presents today to follow up on her GAD and OCD.  History is suggestive of anxiety and OCD symptoms exacerbate under stress. In past it was related with relationship issues which is resolved now with her husband.  Patient currently is on 100 mg Zoloft. She has been talking about cutting it down. Her OCD symptoms are reasonably controlled but she does not want to be on this medication long-term she has ran the marathon of [place] and did 5 hours. She did a good job and she is excited about it. She wants to cut it down the medication but slowly.  Worries are not excessive Sleep is adequate. Anxiety is 3/10. 10 being extreme Modifying factors; exercise and running  aggravatign factor: marital problem in past  No psychotic symptoms or mania   Suicidal Ideation: No Plan Formed: No Patient has means to carry out plan: No  Homicidal Ideation: No Plan Formed: No Patient has means to carry out plan: No  Review of Systems: Psychiatric: Agitation: No Hallucination: No Depressed Mood: No Insomnia: No Hypersomnia: No Altered Concentration: No Feels Worthless: No Grandiose Ideas: No  Belief In Special Powers: No New/Increased Substance Abuse: No Compulsions: No  Neurologic: Headache: No Seizure: No Paresthesias: No  Past Medical Family, Social History: patient is keeping busy and coping well.  Outpatient Encounter Prescriptions as of 06/11/2015  Medication Sig  . Multiple Vitamin (MULTIVITAMIN) tablet Take 1 tablet by mouth.  . sertraline (ZOLOFT) 50 MG tablet Take 2 tablets (100 mg total) by mouth daily.  . [DISCONTINUED] sertraline (ZOLOFT) 100 MG tablet Take 1 tablet (100 mg total) by mouth daily.   No facility-administered  encounter medications on file as of 06/11/2015.    Past Psychiatric History/Hospitalization(s): Anxiety: Yes  0/10 Bipolar Disorder: No Depression: No Mania: No Psychosis: No Schizophrenia: No Personality Disorder: No Hospitalization for psychiatric illness: No History of Electroconvulsive Shock Therapy: No Prior Suicide Attempts: No  ROS: denies nausea, headache. No tremors. No chest pain  Physical Exam: Constitutional:  BP 118/62 mmHg  Pulse 58  Ht 5' 7.5" (1.715 m)  Wt 143 lb (64.864 kg)  BMI 22.05 kg/m2  SpO2 97%  General Appearance: alert, oriented, no acute distress  Musculoskeletal: Strength & Muscle Tone: within normal limits Gait & Station: normal Patient leans: N/A  Psychiatric: Speech (describe rate, volume, coherence, spontaneity, and abnormalities if any): normal  Thought Process (describe rate, content, abstract reasoning, and computation): normal  Associations: Coherent and Relevant  Thoughts: normal  Language: normal and appropriate.  Fund of knowledge: above average   Mental Status: Orientation: oriented to person, place and time/date Mood & Affect: normal affect Attention Span & Concentration: normal    Assessment: Patient is doing baseline on meds.   Axis I: GAD, OCD STABLE  Plan: 1. Continue zoloft to 100mg  for anxiety and OCD. After 2 months she will lower dose to 75mg  that will be one month before she sees me.  sleep is adequate. Depression: denies Vitamin d: she takes calcium and follow up with provider Back pain : does not endorse pain as of now.  More than 50% spent in counseling and coordination. Including patient education  Call if needed.or report to ER for any urgent concern.  Time spent: 25 minutes    Merian Capron, MD 06/11/2015

## 2015-08-13 ENCOUNTER — Ambulatory Visit (HOSPITAL_COMMUNITY): Payer: Self-pay | Admitting: Psychiatry

## 2015-08-16 ENCOUNTER — Ambulatory Visit (INDEPENDENT_AMBULATORY_CARE_PROVIDER_SITE_OTHER): Payer: 59

## 2015-08-16 ENCOUNTER — Ambulatory Visit (INDEPENDENT_AMBULATORY_CARE_PROVIDER_SITE_OTHER): Payer: 59 | Admitting: Sports Medicine

## 2015-08-16 ENCOUNTER — Encounter: Payer: Self-pay | Admitting: Sports Medicine

## 2015-08-16 VITALS — BP 112/68 | HR 63 | Resp 16 | Wt 145.1 lb

## 2015-08-16 DIAGNOSIS — R319 Hematuria, unspecified: Secondary | ICD-10-CM | POA: Diagnosis not present

## 2015-08-16 DIAGNOSIS — M79661 Pain in right lower leg: Secondary | ICD-10-CM

## 2015-08-16 DIAGNOSIS — R109 Unspecified abdominal pain: Secondary | ICD-10-CM

## 2015-08-16 DIAGNOSIS — M84369A Stress fracture, unspecified tibia and fibula, initial encounter for fracture: Secondary | ICD-10-CM | POA: Insufficient documentation

## 2015-08-16 LAB — POCT URINALYSIS DIPSTICK
Bilirubin, UA: NEGATIVE
Glucose, UA: NEGATIVE
Ketones, UA: NEGATIVE
Nitrite, UA: NEGATIVE
Protein, UA: NEGATIVE
Spec Grav, UA: 1.01
Urobilinogen, UA: 0.2
pH, UA: 5.5

## 2015-08-16 MED ORDER — CALCIUM CARBONATE-VITAMIN D 600-400 MG-UNIT PO TABS: 1.0000 | ORAL_TABLET | Freq: Two times a day (BID) | ORAL | Status: DC

## 2015-08-16 NOTE — Progress Notes (Signed)
   Subjective:    I'm seeing this patient as a consultation for:  Dr. 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 Moderate, persistent without radiation. We did discuss calcium and vitamin D supplementation however she told me that she thinks she gets kidney stones every time she takes calcium supplements, currently having right flank pain. For this reason she is hesitant to use calcium supplements.  Past medical history, Surgical history, Family history not pertinant except as noted below, Social history, Allergies, and medications have been entered into the medical record, reviewed, and no changes needed.   Review of Systems: No headache, visual changes, nausea, vomiting, diarrhea, constipation, dizziness, abdominal pain, skin rash, fevers, chills, night sweats, weight loss, swollen lymph nodes, body aches, joint swelling, muscle aches, chest pain, shortness of breath, mood changes, visual or auditory hallucinations.   Objective:   General: Well Developed, well nourished, and in no acute distress.  Neuro/Psych: Alert and oriented x3, extra-ocular muscles intact, able to move all 4 extremities, sensation grossly intact. Skin: Warm and dry, no rashes noted.  Respiratory: Not using accessory muscles, speaking in full sentences, trachea midline.  Cardiovascular: Pulses palpable, no extremity edema. Abdomen: Does not appear distended. Right leg: Tender to palpation in a very discrete spot on the posterior medial midshaft of the tibia.  Urinalysis shows trace blood  Impression and Recommendations:   This case required medical decision making of moderate complexity.

## 2015-08-16 NOTE — Addendum Note (Signed)
Addended by: Elizabeth Sauer on: 08/16/2015 04:55 PM   Modules accepted: Orders

## 2015-08-16 NOTE — Assessment & Plan Note (Addendum)
Patient tells me she suspects that she gets nephrolithiasis with calcium supplementation, she does have trace blood in her urinalysis. Abdominal x-ray and cultures obtained.  Further follow-up with PCP regarding this.  Abdominal x-rays negative, ordering stat stone protocol CT abdomen and pelvis

## 2015-08-16 NOTE — Assessment & Plan Note (Signed)
Discrete tenderness at the posterior medial border of the mid tibia highly suggestive of a stress injury. She will bring back her orthotics, I do need to plane down the toes. I will also apply a stirrup ankle brace, and get x-rays. She needs to do a calcium and vitamin D supplement twice a day. No running for the next month.

## 2015-08-17 ENCOUNTER — Ambulatory Visit (INDEPENDENT_AMBULATORY_CARE_PROVIDER_SITE_OTHER): Payer: 59 | Admitting: Psychiatry

## 2015-08-17 ENCOUNTER — Encounter (HOSPITAL_COMMUNITY): Payer: Self-pay | Admitting: Psychiatry

## 2015-08-17 ENCOUNTER — Telehealth: Payer: Self-pay | Admitting: Sports Medicine

## 2015-08-17 VITALS — BP 122/64 | HR 64 | Ht 67.5 in | Wt 140.0 lb

## 2015-08-17 DIAGNOSIS — F411 Generalized anxiety disorder: Secondary | ICD-10-CM | POA: Diagnosis not present

## 2015-08-17 DIAGNOSIS — F429 Obsessive-compulsive disorder, unspecified: Secondary | ICD-10-CM

## 2015-08-17 DIAGNOSIS — E559 Vitamin D deficiency, unspecified: Secondary | ICD-10-CM | POA: Diagnosis not present

## 2015-08-17 MED ORDER — DICLOFENAC SODIUM 2 % TD SOLN: 2.0000 | Freq: Two times a day (BID) | TRANSDERMAL | Status: DC

## 2015-08-17 NOTE — Addendum Note (Signed)
Addended by: Silverio Decamp on: 08/17/2015 11:28 AM   Modules accepted: Orders

## 2015-08-17 NOTE — Telephone Encounter (Signed)
Sending in [place] to [place]. Thank you for working on this!

## 2015-08-17 NOTE — Telephone Encounter (Signed)
Attempted to contact Pt, called insurance and they can not locate Pt. Spoke with Pt regarding insurance, states it is valid. Will try again.   While on the phone with Pt, she requested an Rx for Voltaren gel. Will route.

## 2015-08-17 NOTE — Telephone Encounter (Signed)
Spoke with Pt and was informed there were multiple people in her office who were having the same issue with their insurance, their HR rep is in the process of contacting [place].   While one the phone, Pt states she wants to hold off on CT at this time. Pt will wait for her urine culture results then may schedule an appt with her OB/GYN. Informed Pt this was a STAT study, still denied at this time. Verbalized understanding.  Pt informed of new Rx. No further questions.

## 2015-08-17 NOTE — Telephone Encounter (Signed)
Noted, thank you

## 2015-08-17 NOTE — Progress Notes (Signed)
Patient ID: Patty [name], female   DOB: 1971/02/04, 45 y.o.   MRN: [id number]  [facility] [address] Progress Note  NYKEBA LINGLE [id number] 45 y.o.  08/17/2015 11:08 AM  Chief Complaint: GAD, and OCD.  History of Present Illness: Patty [name] presents today to follow up on her GAD and OCD.  History is suggestive of anxiety and OCD symptoms exacerbate under stress. In past it was related with relationship issues which is resolved now with her husband.  Her OCD is reasonably controlled now she is on 50 mg Zoloft she is slowly lowering the dose. Managing it well she wants to keep the 50 mg for the next 1 month at least. Some injury on the leg so she cannot run for the next 3 weeks.  Worries are not excessive Sleep is adequate. Anxiety is 3/10. 10 being extreme Modifying factors; exercise and running  aggravatign factor: marital problem in past  No psychotic symptoms or mania   Suicidal Ideation: No Plan Formed: No Patient has means to carry out plan: No  Homicidal Ideation: No Plan Formed: No Patient has means to carry out plan: No  Review of Systems: Psychiatric: Agitation: No Hallucination: No Depressed Mood: No Insomnia: No Hypersomnia: No Altered Concentration: No Feels Worthless: No Grandiose Ideas: No  Belief In Special Powers: No New/Increased Substance Abuse: No Compulsions: No  Neurologic: Headache: No Seizure: No Paresthesias: No  Past Medical Family, Social History: patient is keeping busy and coping well.  Outpatient Encounter Prescriptions as of 08/17/2015  Medication Sig  . Calcium Carbonate-Vitamin D 600-400 MG-UNIT tablet Take 1 tablet by mouth 2 (two) times daily.  . Multiple Vitamin (MULTIVITAMIN) tablet Take 1 tablet by mouth.  . sertraline (ZOLOFT) 50 MG tablet Take 2 tablets (100 mg total) by mouth daily. (Patient taking differently: Take 50 mg by mouth daily. )   No facility-administered encounter medications on file as of  08/17/2015.    Past Psychiatric History/Hospitalization(s): Anxiety: Yes  0/10 Bipolar Disorder: No Depression: No Mania: No Psychosis: No Schizophrenia: No Personality Disorder: No Hospitalization for psychiatric illness: No History of Electroconvulsive Shock Therapy: No Prior Suicide Attempts: No  ROS: denies nausea, headache. Right lower leg pain. No chest pain  Physical Exam: Constitutional:  BP 122/64 mmHg  Pulse 64  Ht 5' 7.5" (1.715 m)  Wt 140 lb (63.504 kg)  BMI 21.59 kg/m2  SpO2 96%  General Appearance: alert, oriented, no acute distress  Musculoskeletal: Strength & Muscle Tone: within normal limits Gait & Station: normal Patient leans: N/A  Psychiatric: Speech (describe rate, volume, coherence, spontaneity, and abnormalities if any): normal  Thought Process (describe rate, content, abstract reasoning, and computation): normal  Associations: Coherent and Relevant  Thoughts: normal  Language: normal and appropriate.  Fund of knowledge: above average   Mental Status: Orientation: oriented to person, place and time/date Mood & Affect: normal affect Attention Span & Concentration: normal    Assessment: Patient is doing baseline on meds.   Axis I: GAD, OCD STABLE  Plan: 1. Continue zoloft 50mg  for anxiety and OCD. After 2 months she will lower dose to 25 mg.  sleep is adequate. Depression: denies Vitamin d: she takes calcium and follow up with provider Back pain : follow up with Dr. Darene Lamer. Rhunette Croft rays and work up in progress More than 50% spent in counseling and coordination. Including patient education  Call if needed.or report to ER for any urgent concern.  Follow up in 2 months.  Time spent: 25 minutes  Patty Capron, MD 08/17/2015

## 2015-08-18 LAB — URINE CULTURE
Colony Count: NO GROWTH
Organism ID, Bacteria: NO GROWTH

## 2015-08-23 ENCOUNTER — Telehealth: Payer: Self-pay

## 2015-08-23 DIAGNOSIS — M79661 Pain in right lower leg: Secondary | ICD-10-CM

## 2015-08-23 NOTE — Telephone Encounter (Signed)
Pt is not worried about the kidney stones.  Her main concern is the tibia and if there is a stress fx.  She would like you to do which ever test- CT or MRI- that will show if there is a problem.

## 2015-08-23 NOTE — Telephone Encounter (Signed)
Left message to call and discuss.  

## 2015-08-23 NOTE — Telephone Encounter (Signed)
MRI of all the structures will probably be around a $5000+ test, essentially 3 different MRIs. It will also give Patty [name] no idea about her kidney stones. Is this what she really wants?

## 2015-08-24 NOTE — Addendum Note (Signed)
Addended by: Silverio Decamp on: 08/24/2015 09:01 AM   Modules accepted: Orders

## 2015-08-24 NOTE — Telephone Encounter (Signed)
MRI of the right tibia ordered

## 2015-08-24 NOTE — Telephone Encounter (Signed)
Patient advised.

## 2015-08-25 ENCOUNTER — Telehealth: Payer: Self-pay

## 2015-08-25 NOTE — Telephone Encounter (Signed)
Shanvika wants to switch her MRI to [place] [facility].

## 2015-08-26 ENCOUNTER — Ambulatory Visit (HOSPITAL_COMMUNITY)
Admission: RE | Admit: 2015-08-26 | Discharge: 2015-08-26 | Disposition: A | Discharge status: Home / Self Care | Payer: 59 | Source: Ambulatory Visit | Attending: Sports Medicine | Admitting: Sports Medicine

## 2015-08-26 DIAGNOSIS — M79661 Pain in right lower leg: Secondary | ICD-10-CM | POA: Insufficient documentation

## 2015-08-26 DIAGNOSIS — R609 Edema, unspecified: Secondary | ICD-10-CM | POA: Insufficient documentation

## 2015-08-26 NOTE — Telephone Encounter (Signed)
Pt has already rescheduled on her own.

## 2015-08-27 LAB — HM DEXA SCAN

## 2015-09-02 ENCOUNTER — Encounter: Payer: Self-pay | Admitting: Sports Medicine

## 2015-09-10 ENCOUNTER — Ambulatory Visit (INDEPENDENT_AMBULATORY_CARE_PROVIDER_SITE_OTHER): Payer: 59 | Admitting: Sports Medicine

## 2015-09-10 DIAGNOSIS — M84369D Stress fracture, unspecified tibia and fibula, subsequent encounter for fracture with routine healing: Secondary | ICD-10-CM

## 2015-09-10 LAB — CBC
HCT: 39.1 % (ref 35.0–45.0)
Hemoglobin: 13.1 g/dL (ref 11.7–15.5)
MCH: 30.8 pg (ref 27.0–33.0)
MCHC: 33.5 g/dL (ref 32.0–36.0)
MCV: 91.8 fL (ref 80.0–100.0)
MPV: 10.4 fL (ref 7.5–12.5)
Platelets: 327 K/uL (ref 140–400)
RBC: 4.26 MIL/uL (ref 3.80–5.10)
RDW: 13.2 % (ref 11.0–15.0)
WBC: 6.1 K/uL (ref 3.8–10.8)

## 2015-09-10 LAB — FERRITIN: Ferritin: 37 ng/mL (ref 10–232)

## 2015-09-10 NOTE — Assessment & Plan Note (Signed)
Bilateral right worse than left stress reaction and this pleasant marathon runner. Continue bilateral leg braces. Continue calcium and vitamin D supplements, checking routine blood work. If persistent symptoms when she gets back into running we will start a bisphosphonate.

## 2015-09-10 NOTE — Progress Notes (Signed)
  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 Since then she has been in a leg brace, on one side, and has not been running. Her pain is now resolved.  Past medical history, Surgical history, Family history not pertinant except as noted below, Social history, Allergies, and medications have been entered into the medical record, reviewed, and no changes needed.   Review of Systems: No fevers, chills, night sweats, weight loss, chest pain, or shortness of breath.   Objective:    General: Well Developed, well nourished, and in no acute distress.  Neuro: Alert and oriented x3, extra-ocular muscles intact, sensation grossly intact.  HEENT: Normocephalic, atraumatic, pupils equal round reactive to light, neck supple, no masses, no lymphadenopathy, thyroid nonpalpable.  Skin: Warm and dry, no rashes. Cardiac: Regular rate and rhythm, no murmurs rubs or gallops, no lower extremity edema.  Respiratory: Clear to auscultation bilaterally. Not using accessory muscles, speaking in full sentences.  Impression and Recommendations:    I spent 25 minutes with this patient, greater than 50% was face-to-face time counseling regarding the above diagnoses, I went over the MRI images with her, discussed the natural history and pathophysiology of human calcium metabolism as well as stress injury and bone healing.

## 2015-09-11 LAB — COMPREHENSIVE METABOLIC PANEL
ALT: 18 U/L (ref 6–29)
Albumin: 4.7 g/dL (ref 3.6–5.1)
Alkaline Phosphatase: 58 U/L (ref 33–115)
Calcium: 9.5 mg/dL (ref 8.6–10.2)
Chloride: 99 mmol/L (ref 98–110)
Glucose, Bld: 89 mg/dL (ref 65–99)
Potassium: 4 mmol/L (ref 3.5–5.3)
Sodium: 138 mmol/L (ref 135–146)
Total Protein: 7.5 g/dL (ref 6.1–8.1)

## 2015-09-11 LAB — COMPREHENSIVE METABOLIC PANEL WITH GFR
AST: 19 U/L (ref 10–35)
BUN: 11 mg/dL (ref 7–25)
CO2: 26 mmol/L (ref 20–31)
Creat: 0.74 mg/dL (ref 0.50–1.10)
Total Bilirubin: 0.7 mg/dL (ref 0.2–1.2)

## 2015-09-11 LAB — VITAMIN D 25 HYDROXY (VIT D DEFICIENCY, FRACTURES): Vit D, 25-Hydroxy: 35 ng/mL (ref 30–100)

## 2015-09-13 ENCOUNTER — Ambulatory Visit: Payer: Self-pay | Admitting: Sports Medicine

## 2015-10-21 ENCOUNTER — Telehealth (HOSPITAL_COMMUNITY): Payer: Self-pay | Admitting: *Deleted

## 2015-10-21 DIAGNOSIS — F429 Obsessive-compulsive disorder, unspecified: Secondary | ICD-10-CM

## 2015-10-21 DIAGNOSIS — F411 Generalized anxiety disorder: Secondary | ICD-10-CM

## 2015-10-21 MED ORDER — SERTRALINE HCL 50 MG PO TABS: 100.0000 mg | ORAL_TABLET | Freq: Every day | ORAL | Status: DC

## 2015-10-21 NOTE — Telephone Encounter (Signed)
Received fax from [facility] for Sertraline 50mg . Per Dr. De Nurse, medication request is authorized for Sertraline 50mg , #60. Rx was sent to pharmacy. Pt is scheduled for a f/u appt on 11/16/15. Called and informed pt of rx status. Pt verbalizes understanding.

## 2015-11-16 ENCOUNTER — Ambulatory Visit (HOSPITAL_COMMUNITY): Payer: Self-pay | Admitting: Psychiatry

## 2015-11-24 ENCOUNTER — Ambulatory Visit (HOSPITAL_COMMUNITY): Payer: Self-pay | Admitting: Psychiatry

## 2015-12-10 ENCOUNTER — Ambulatory Visit (HOSPITAL_COMMUNITY): Payer: Self-pay | Admitting: Psychiatry

## 2015-12-10 ENCOUNTER — Encounter (HOSPITAL_COMMUNITY): Payer: Self-pay | Admitting: Psychiatry

## 2015-12-10 ENCOUNTER — Ambulatory Visit (INDEPENDENT_AMBULATORY_CARE_PROVIDER_SITE_OTHER): Payer: 59 | Admitting: Psychiatry

## 2015-12-10 VITALS — BP 126/70 | HR 60 | Ht 67.5 in | Wt 144.0 lb

## 2015-12-10 DIAGNOSIS — F429 Obsessive-compulsive disorder, unspecified: Secondary | ICD-10-CM

## 2015-12-10 DIAGNOSIS — E559 Vitamin D deficiency, unspecified: Secondary | ICD-10-CM | POA: Diagnosis not present

## 2015-12-10 DIAGNOSIS — F411 Generalized anxiety disorder: Secondary | ICD-10-CM

## 2015-12-10 NOTE — Progress Notes (Signed)
Patient ID: Patty [name], female   DOB: 1971/02/22, 45 y.o.   MRN: [id number]  [facility] [id number] Progress Note  Patty [name] [id number] 45 y.o.  12/10/2015 12:18 PM  Chief Complaint: GAD, and OCD.  History of Present Illness: Patty [name] presents today to follow up on her GAD and OCD.  History is suggestive of anxiety and OCD symptoms exacerbate under stress. In past it was related with relationship issues which is resolved now with her husband.  Her OCD is reasonably controlled . Last visit she decided she wants to take off from Zoloft so we decided about the plan to taper down slowly she is off from Zoloft for the last 1 month she is doing reasonably initially she has some headache that overall recurrence of any significant anxiety or depression She continues to run and keep herself busy and prepare for marathon Planning to visit [place] this summer  Worries are not excessive Sleep is adequate. Anxiety is 3/10. 10 being extreme Modifying factors; exercise and running  aggravatign factor: marital problem in past  No psychotic symptoms or mania   Suicidal Ideation: No Plan Formed: No Patient has means to carry out plan: No  Homicidal Ideation: No Plan Formed: No Patient has means to carry out plan: No  Review of Systems: Psychiatric: Agitation: No Hallucination: No Depressed Mood: No Insomnia: No Hypersomnia: No Altered Concentration: No Feels Worthless: No Grandiose Ideas: No  Belief In Special Powers: No New/Increased Substance Abuse: No Compulsions: No  Neurologic: Headache: No Seizure: No Paresthesias: No  Past Medical Family, Social History: patient is keeping busy and coping well.  Outpatient Encounter Prescriptions as of 12/10/2015  Medication Sig  . Calcium Carbonate-Vitamin D 600-400 MG-UNIT tablet Take 1 tablet by mouth 2 (two) times daily.  . Multiple Vitamin (MULTIVITAMIN) tablet Take 1 tablet by mouth.  . sertraline (ZOLOFT) 50 MG  tablet Take 2 tablets (100 mg total) by mouth daily. (Patient not taking: Reported on 12/10/2015)   No facility-administered encounter medications on file as of 12/10/2015.    Past Psychiatric History/Hospitalization(s): Anxiety: Yes  0/10 Bipolar Disorder: No Depression: No Mania: No Psychosis: No Schizophrenia: No Personality Disorder: No Hospitalization for psychiatric illness: No History of Electroconvulsive Shock Therapy: No Prior Suicide Attempts: No  ROS: denies nausea, headache. Right lower leg pain. No chest pain  Physical Exam: Constitutional:  BP 126/70 mmHg  Pulse 60  Ht 5' 7.5" (1.715 m)  Wt 144 lb (65.318 kg)  BMI 22.21 kg/m2  SpO2 97%  General Appearance: alert, oriented, no acute distress. No nausea or headaches  Musculoskeletal: Strength & Muscle Tone: within normal limits Gait & Station: normal Patient leans: N/A  Psychiatric: Speech (describe rate, volume, coherence, spontaneity, and abnormalities if any): normal  Thought Process (describe rate, content, abstract reasoning, and computation): normal  Associations: Coherent and Relevant  Thoughts: normal  Language: normal and appropriate.  Fund of knowledge: above average   Mental Status: Orientation: oriented to person, place and time/date Mood & Affect: normal affect Attention Span & Concentration: normal  No suicidal toughts.  No psychotic symptoms  Assessment: Patient is doing baseline on meds.   Axis I: GAD, OCD STABLE  Plan: 1. Hold off from zoloft. Doing well Depression: denies Vitamin d: she takes calcium and follow up with provider Back pain : follow up with Dr. Darene Lamer.   More than 50% spent in counseling and coordination. Including patient education  Call if needed.or report to ER for any urgent concern.  Follow up in 6 months or eaerlier if needed  Time spent: 25 minutes    Merian Capron, MD 12/10/2015

## 2016-01-24 ENCOUNTER — Telehealth: Payer: Self-pay

## 2016-01-24 ENCOUNTER — Ambulatory Visit: Payer: 59 | Attending: Sports Medicine

## 2016-01-24 ENCOUNTER — Other Ambulatory Visit: Payer: Self-pay | Admitting: Sports Medicine

## 2016-01-24 DIAGNOSIS — M25572 Pain in left ankle and joints of left foot: Secondary | ICD-10-CM | POA: Diagnosis not present

## 2016-01-24 DIAGNOSIS — M25672 Stiffness of left ankle, not elsewhere classified: Secondary | ICD-10-CM

## 2016-01-24 DIAGNOSIS — M76892 Other specified enthesopathies of left lower limb, excluding foot: Secondary | ICD-10-CM

## 2016-01-24 DIAGNOSIS — M7662 Achilles tendinitis, left leg: Secondary | ICD-10-CM

## 2016-01-24 DIAGNOSIS — M25571 Pain in right ankle and joints of right foot: Secondary | ICD-10-CM | POA: Insufficient documentation

## 2016-01-24 NOTE — Therapy (Signed)
[facility] [address] [place], [place], [address] Phone: [phone]   Fax:  [phone]  Physical Therapy Evaluation  Patient Details  Name: Patty [name] MRN: [id number] Date of Birth: 1970-11-27 Referring Provider: Edythe Clarity, MD  Encounter Date: 01/24/2016      PT End of Session - 01/24/16 1051    Visit Number 1   Date for PT Re-Evaluation 03/20/16   PT Start Time 1017   PT Stop Time 1051   PT Time Calculation (min) 34 min   Activity Tolerance Patient tolerated treatment well   Behavior During Therapy [facility] for tasks assessed/performed      Past Medical History:  Diagnosis Date  . Anxiety     History reviewed. No pertinent surgical history.  There were no vitals filed for this visit.       Subjective Assessment - 01/24/16 1023    Subjective Pt presents to PT with complaints of Lt>Rt achilles pain that has worsened over the past 2-3 months.  Pt has been training for a marathon and has been increasing her distances with running.  Pain was at its worst 2 days ago and went to the orthopedic walk in clinic.     Pertinent History Marathon scheduled 03/12/16   Diagnostic tests x-ray: negative   Patient Stated Goals reduce pain to complete marathon (03/12/16)   Currently in Pain? Yes   Pain Score 0-No pain  up to 5/10 after running   Pain Location Ankle  achilles tendon   Pain Orientation Left;Right   Pain Descriptors / Indicators Discomfort;Tightness   Pain Type Acute pain   Pain Onset More than a month ago   Pain Frequency Intermittent   Aggravating Factors  after running- Achilles tightens and is painful    Pain Relieving Factors stretching, rest, Meloxicam, ice 3x/day            [facility] PT Assessment - 01/24/16 0001      Assessment   Medical Diagnosis Lt achilles tendonitis   Referring Provider Edythe Clarity, MD   Onset Date/Surgical Date 11/24/15   Next MD Visit  none scheduled   Prior Therapy none     Precautions   Precautions None     Restrictions   Weight Bearing Restrictions No     Balance Screen   Has the patient fallen in the past 6 months No   Has the patient had a decrease in activity level because of a fear of falling?  No   Is the patient reluctant to leave their home because of a fear of falling?  No     Home Environment   Living Environment Private residence   Living Arrangements Spouse/significant other   Type of [place] to enter   Home Layout Two level     Prior Function   Level of [place] Full time employment   [place]- desk work, travel   Leisure running- training for a marathon     Cognition   Overall Cognitive Status Within Functional Limits for tasks assessed     Observation/Other Assessments   Focus on Therapeutic Outcomes (FOTO)  11% limitation     Posture/Postural Control   Posture/Postural Control Postural limitations   Postural Limitations --  rearfoot eversion bilaterally     ROM / Strength   AROM / PROM / Strength AROM;PROM;Strength     AROM   Overall AROM  Deficits  Overall AROM Comments DF limited by ~10% bil with stiffness reported at end range.  All other AROM is full.       PROM   Overall PROM  Within functional limits for tasks performed   Overall PROM Comments stiffness/tightness at end range DF     Strength   Overall Strength Within functional limits for tasks performed   Overall Strength Comments 5/5 bil.  ankle strength, pain with PF on the Lt     Palpation   Palpation comment palpable tenderness over distal Lt achilles tendon at insertion.  No edema.  No tenderness over the Rt.       Ambulation/Gait   Ambulation/Gait Yes   Ambulation/Gait Assistance 7: Independent   Ambulation Distance (Feet) 100 Feet   Gait Pattern Within Functional Limits  rearfoot eversion bilaterally                    [facility] Adult PT Treatment/Exercise - 01/24/16 0001      Modalities   Modalities Iontophoresis     Iontophoresis   Type of Iontophoresis Dexamethasone   Location Lt achilles insertion   Dose 1.0 cc  #1   Time 6 hour wear time                PT Education - 01/24/16 1041    Education provided Yes   Education Details ionto, gastroc/soleus flexiblity   Person(s) Educated Patient   Methods Explanation;Demonstration;Handout   Comprehension Verbalized understanding          PT Short Term Goals - 01/24/16 1027      PT SHORT TERM GOAL #1   Title be independent in initial HEP   Time 4   Period Weeks   Status New     PT SHORT TERM GOAL #2   Title report < or = to 3/10 achilles pain after running   Time 4   Period Weeks   Status New           PT Long Term Goals - 01/24/16 1014      PT LONG TERM GOAL #1   Title be independent in advanced HEP   Time 8   Period Weeks   Status New     PT LONG TERM GOAL #2   Title reduce FOTO to < or = to 3% limitation   Time 8   Period Weeks   Status New     PT LONG TERM GOAL #3   Title report < or = to 2/10 ankle pain after running long distance   Time 8   Period Weeks   Status New               Plan - 01/24/16 1051    Clinical Impression Statement Pt presents to PT with Lt>Rt achilles tendon pain and stiffness that has worsened over the past 2 months as she is training for a marathon.  Pt had increased pain yesterday and went to urgent care.  Pt with symmetrical gait, stiffness of DF bil., FOTO score of 11% limitation and up to 5/10 ankle pain after running.  Pt will benefit from skilled PT for flexibility, manual, ionto and strength progression to allow for running without pain.     Rehab Potential Good   PT Frequency 2x / week   PT Duration 8 weeks   PT Treatment/Interventions ADLs/Self Care Home Management;Cryotherapy;Electrical Stimulation;Functional mobility training;Gait  training;Ultrasound;Therapeutic activities;Moist Heat;Iontophoresis 4mg /ml Dexamethasone;Therapeutic exercise;Neuromuscular re-education;Patient/family education;Passive range of motion;Vasopneumatic Device;Manual techniques;Dry needling  PT Next Visit Plan Ionto #2, gastroc flexiblity, manual, dry needling to gastroc, [place]   Consulted and Agree with Plan of Care Patient      Patient will benefit from skilled therapeutic intervention in order to improve the following deficits and impairments:  Decreased range of motion, Pain, Decreased activity tolerance  Visit Diagnosis: Pain in left ankle and joints of left foot - Plan: PT plan of care cert/re-cert  Stiffness of left ankle, not elsewhere classified - Plan: PT plan of care cert/re-cert  Pain in right ankle and joints of right foot - Plan: PT plan of care cert/re-cert     Problem List Patient Active Problem List   Diagnosis Date Noted  . Stress reaction of tibia 08/16/2015  . Hematuria 08/16/2015  . Patellar tendinitis of left knee 03/29/2015  . Toe pain, left 02/01/2015  . 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 Closed fracture of femur, neck ([place]) 08/17/2014  . Displacement of lumbar intervertebral disc without myelopathy 07/07/2014  . Compression sided left femoral neck stress fracture 06/12/2014  . Avitaminosis D 03/10/2014  . Lumbar degenerative disc disease 03/06/2014  . Leg length discrepancy 03/06/2014  . Obsessive compulsive disorder 08/26/2013  . Generalized anxiety disorder 12/24/2011  . Arthralgia of hip 08/11/2011     Sigurd Sos, PT 01/24/16 11:00 AM  [facility] [address], [place], [place], [address] Phone: [phone]   Fax:  [phone]  Name: VEYDA GOVERN MRN: [id number] Date of Birth: 1970/09/30

## 2016-01-24 NOTE — Telephone Encounter (Signed)
Pt is need of a repeat referral to PT in [place]. Pt states told PT that she has the same injury and that she was seen in Ortho [facility] and referral was given but doesn't have previous diagnosis. Please assist.

## 2016-01-24 NOTE — Patient Instructions (Addendum)
SEATED Gastroc / Heel Cord Stretch - Seated With Towel   Sit on floor, towel around ball of foot. Gently pull foot in toward body, stretching heel cord and calf. Hold for _30__ seconds. Repeat on involved leg. Repeat __3_ times. Do _3__ times per day.   Achilles / Gastroc, Standing   Stand, right foot behind, heel on floor and turned slightly out, leg straight, forward leg bent. Move hips forward. Hold _30__ seconds. Repeat _3__ times per session. Do _3__ sessions per day.   Stretching: Soleus   Stand with right foot back, both knees bent. Keeping heel on floor, turned slightly out, lean into wall until stretch is felt in lower calf. Hold _30___ seconds. Repeat __3__ times per set. Do __3__ sessions per day.    http://orth.exer.us/82   Gastroc / Heel Cord Stretch - On Step   Stand with heels over edge of stair. Holding rail, lower heels until stretch is felt in calf of legs. Hold 30 secs.  Repeat __3_ times. Do __3_ times per day.  Copyright  VHI. All rights reserved.   IONTOPHORESIS PATIENT PRECAUTIONS & CONTRAINDICATIONS:  . Redness under one or both electrodes can occur.  This characterized by a uniform redness that usually disappears within 12 hours of treatment. . Small pinhead size blisters may result in response to the drug.  Contact your physician if the problem persists more than 24 hours. . On rare occasions, iontophoresis therapy can result in temporary skin reactions such as rash, inflammation, irritation or burns.  The skin reactions may be the result of individual sensitivity to the ionic solution used, the condition of the skin at the start of treatment, reaction to the materials in the electrodes, allergies or sensitivity to dexamethasone, or a poor connection between the patch and your skin.  Discontinue using iontophoresis if you have any of these reactions and report to your therapist. . Remove the Patch or electrodes if you have any undue sensation of pain  or burning during the treatment and report discomfort to your therapist. . Tell your Therapist if you have had known adverse reactions to the application of electrical current. . If using the Patch, the LED light will turn off when treatment is complete and the patch can be removed.  Approximate treatment time is 1-3 hours.  Remove the patch when light goes off or after 6 hours. . The Patch can be worn during normal activity, however excessive motion where the electrodes have been placed can cause poor contact between the skin and the electrode or uneven electrical current resulting in greater risk of skin irritation. Marland Kitchen Keep out of the reach of children.   . DO NOT use if you have a cardiac pacemaker or any other electrically sensitive implanted device. . DO NOT use if you have a known sensitivity to dexamethasone. . DO NOT use during Magnetic Resonance Imaging (MRI). . DO NOT use over broken or compromised skin (e.g. sunburn, cuts, or acne) due to the increased risk of skin reaction. . DO NOT SHAVE over the area to be treated:  To establish good contact between the Patch and the skin, excessive hair may be clipped. . DO NOT place the Patch or electrodes on or over your eyes, directly over your heart, or brain. . DO NOT reuse the Patch or electrodes as this may cause burns to occur.   [facility] [address] [place], [place] [address] Phone # [phone] Fax [phone]

## 2016-01-24 NOTE — Telephone Encounter (Signed)
Orders placed, she does need to keep in mind that she had a femoral neck stress fracture in the past as well.

## 2016-01-25 ENCOUNTER — Encounter: Payer: Self-pay | Admitting: Sports Medicine

## 2016-01-25 ENCOUNTER — Ambulatory Visit: Payer: Self-pay | Admitting: Sports Medicine

## 2016-01-25 ENCOUNTER — Ambulatory Visit (INDEPENDENT_AMBULATORY_CARE_PROVIDER_SITE_OTHER): Payer: 59 | Admitting: Sports Medicine

## 2016-01-25 DIAGNOSIS — M7662 Achilles tendinitis, left leg: Secondary | ICD-10-CM | POA: Diagnosis not present

## 2016-01-25 DIAGNOSIS — M7661 Achilles tendinitis, right leg: Secondary | ICD-10-CM

## 2016-01-25 MED ORDER — NITROGLYCERIN 0.2 MG/HR TD PT24: MEDICATED_PATCH | TRANSDERMAL | 11 refills | Status: DC

## 2016-01-25 NOTE — Assessment & Plan Note (Signed)
Very mild recurrence without nodularity. Continue custom orthotics with heel lifts, she will continue eccentric rehabilitation with physical therapy, and I am going to add nitroglycerin patches. She has a race coming up on October 8, I like to see her in one month to 5 weeks.

## 2016-01-25 NOTE — Progress Notes (Signed)
  Subjective:    CC: bilateral Achilles pain   HPI: 45 yo F presenting with several weeks of bilateral Achilles pain.  Pt is a marathon runner and has noticed pain and "stiffness" in her bilateral Achilles region over the past couple of weeks when exercising and when taking first couple steps in the morning.  Pain does not radiate down her ankle/foot or up her leg.  She has not tried any pain medication.  She was seen in Urgent care yesterday for pain, during which time she was diagnosed with Achilles tendonitis.  She started PT yesterday and received a dexamethasone treatment while there.  She also bought heel lifts that she plans to use.  She is already using custom orthotics in her running shoes.   Past medical history, Surgical history, Family history not pertinant except as noted below, Social history, Allergies, and medications have been entered into the medical record, reviewed, and no changes needed.   Review of Systems: No fevers, chills, night sweats, weight loss, chest pain, or shortness of breath.   Objective:    General: Well Developed, well nourished, and in no acute distress.  Neuro: Alert and oriented x3, extra-ocular muscles intact, sensation grossly intact.  HEENT: Normocephalic, atraumatic, pupils equal round reactive to light, neck supple, no masses, no lymphadenopathy, thyroid nonpalpable.  Skin: Warm and dry, no rashes. Cardiac: Regular rate and rhythm, no murmurs rubs or gallops, no lower extremity edema.  Respiratory: Clear to auscultation bilaterally. Not using accessory muscles, speaking in full sentences. R, L Ankle: No visible erythema or swelling. Range of motion is full in all directions. Strength is 5/5 in all directions. Stable lateral and medial ligaments; squeeze test and kleiger test unremarkable; Talar dome nontender; No pain at base of 5th MT; No tenderness over cuboid; No tenderness over N spot or navicular prominence No tenderness on posterior aspects  of lateral and medial malleolus No sign of peroneal tendon subluxations or tenderness to palpation Negative tarsal tunnel tinel's Able to walk 4 steps.    Impression and Recommendations:    1. Bilateral Achilles tendonosis  -Continue PT, can continue dexamethasone treatments at PT  -Nitro patches daily  -Continue to wear heel lifts and orthotics -Return in one month for follow-up

## 2016-01-26 ENCOUNTER — Encounter: Payer: Self-pay | Admitting: Physical Therapy

## 2016-01-26 ENCOUNTER — Ambulatory Visit: Payer: Self-pay | Admitting: Sports Medicine

## 2016-01-26 ENCOUNTER — Ambulatory Visit: Payer: 59 | Admitting: Physical Therapy

## 2016-01-26 DIAGNOSIS — M25572 Pain in left ankle and joints of left foot: Secondary | ICD-10-CM | POA: Diagnosis not present

## 2016-01-26 DIAGNOSIS — M25571 Pain in right ankle and joints of right foot: Secondary | ICD-10-CM

## 2016-01-26 DIAGNOSIS — M25672 Stiffness of left ankle, not elsewhere classified: Secondary | ICD-10-CM

## 2016-01-26 NOTE — Patient Instructions (Signed)
   Start on your side with a foam roll under your bottom thigh.    Next, using your arms and unaffected leg, roll up and down the foam roll along your lateral thigh.  15 rolls per side    Place foam roll under hip flexor (top of thigh). Roll forward and backward.   Position yourself seated on the floor with the foam roller under your calves.  Use your hands on the floor to push your weight up off the floor and roll the roller along the length of your calf muscles.  Try turning toes in and out to access the inside and outside of calf areas.  Do not roll in the crease of your knee.  To progress and apply more pressure, cross one leg over the other and try rolling each leg individually   Place front half of foot and ankle to be stretched on a 5# weight, wedge, 2x4, book or rolled towel to 2" diameter. Bring opposite knee up to waist level as shown. Gently drive back and forth over ankle, maintaining the stretch the entire time, just increasing or decreasing the intensity with drives). KEEP HEEL ON THE GROUND. Hold for 30 seconds.      While seated, place a small ball under your foot and press into it while rolling it around.   Use this form of self-soft tissue massage technique for the arch of the foot.   [facility] [address], [place], [place] [address] Phone # [phone] Fax [phone]

## 2016-01-26 NOTE — Therapy (Signed)
[facility] [address] [place], [place], [address] Phone: [phone]   Fax:  [phone]  Physical Therapy Treatment  Patient Details  Name: Patty [name] MRN: [id number] Date of Birth: February 03, 1971 Referring Provider: Edythe Clarity, MD  Encounter Date: 01/26/2016      PT End of Session - 01/26/16 1708    Visit Number 2   Date for PT Re-Evaluation 03/20/16   PT Start Time [id number]   PT Stop Time 1705   PT Time Calculation (min) 50 min   Activity Tolerance Patient tolerated treatment well   Behavior During Therapy [facility] for tasks assessed/performed      Past Medical History:  Diagnosis Date  . Anxiety     History reviewed. No pertinent surgical history.  There were no vitals filed for this visit.      Subjective Assessment - 01/26/16 1621    Subjective I did not like the ionto patch.    Pertinent History Marathon scheduled 03/12/16   Diagnostic tests x-ray: negative   Patient Stated Goals reduce pain to complete marathon (03/12/16)   Currently in Pain? Yes   Pain Score 8    Pain Location Ankle   Pain Orientation Right;Left   Pain Descriptors / Indicators Tightness;Discomfort   Pain Type Acute pain   Pain Onset More than a month ago   Pain Frequency Intermittent   Aggravating Factors  after running- Achilles teghtens and is painful   Pain Relieving Factors stretching, rest, Meloxicam, ice 3x/day   Multiple Pain Sites No                         [facility] Adult PT Treatment/Exercise - 01/26/16 0001      Manual Therapy   Manual Therapy Soft tissue mobilization;Joint mobilization   Manual therapy comments manually stretched bil. iliotibial band   Joint Mobilization distraciton of bil. ankles grade 3; calcaneal rock bil. mobilization of bil. bones of feet   Soft tissue mobilization bil. achilles tendon, gastroc, peroneal muscles, plantarfaciatis, and soleus                PT Education - 01/26/16 1706    Education provided Yes   Education Details foam rolling of muscles,  calf stretch   Person(s) Educated Patient   Methods Explanation;Demonstration;Verbal cues;Handout   Comprehension Returned demonstration;Verbalized understanding          PT Short Term Goals - 01/24/16 1027      PT SHORT TERM GOAL #1   Title be independent in initial HEP   Time 4   Period Weeks   Status New     PT SHORT TERM GOAL #2   Title report < or = to 3/10 achilles pain after running   Time 4   Period Weeks   Status New           PT Long Term Goals - 01/24/16 1014      PT LONG TERM GOAL #1   Title be independent in advanced HEP   Time 8   Period Weeks   Status New     PT LONG TERM GOAL #2   Title reduce FOTO to < or = to 3% limitation   Time 8   Period Weeks   Status New     PT LONG TERM GOAL #3   Title report < or = to 2/10 ankle pain after running long distance   Time 8   Period  Weeks   Status New               Plan - 01/26/16 1709    Clinical Impression Statement Patient did not like the iontophoresis and did not want it as part of her treatment.  Patient has tightness in bil. iliotibial band causing her to abduct hip with extension.  Tightness in bil. ankle joint and joints of the feet.  Trigger points in the right peroneal muscle.  Patient will benefit from skilled therapy to reduce pain and improve tissue mobility.    Rehab Potential Good   PT Frequency 2x / week   PT Duration 8 weeks   PT Treatment/Interventions ADLs/Self Care Home Management;Cryotherapy;Electrical Stimulation;Functional mobility training;Gait training;Ultrasound;Therapeutic activities;Moist Heat;Iontophoresis 4mg /ml Dexamethasone;Therapeutic exercise;Neuromuscular re-education;Patient/family education;Passive range of motion;Vasopneumatic Device;Manual techniques;Dry needling   PT Next Visit Plan Ionto #2 if patient wants,  manual, dry needling to gastroc, peroneal, and  iliotibial band, [place], eccetric control of gastroc   PT Home Exercise Plan progress as needed   Recommended Other Services None   Consulted and Agree with Plan of Care Patient      Patient will benefit from skilled therapeutic intervention in order to improve the following deficits and impairments:  Decreased range of motion, Pain, Decreased activity tolerance  Visit Diagnosis: Pain in left ankle and joints of left foot  Stiffness of left ankle, not elsewhere classified  Pain in right ankle and joints of right foot     Problem List Patient Active Problem List   Diagnosis Date Noted  . Stress reaction of tibia 08/16/2015  . Hematuria 08/16/2015  . Patellar tendinitis of left knee 03/29/2015  . Toe pain, left 02/01/2015  . 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 Closed fracture of femur, neck ([place]) 08/17/2014  . Displacement of lumbar intervertebral disc without myelopathy 07/07/2014  . Compression sided left femoral neck stress fracture 06/12/2014  . Avitaminosis D 03/10/2014  . Lumbar degenerative disc disease 03/06/2014  . Leg length discrepancy 03/06/2014  . Obsessive compulsive disorder 08/26/2013  . Generalized anxiety disorder 12/24/2011  . Arthralgia of hip 08/11/2011    Earlie Counts, PT 01/26/16 5:14 PM    [facility] [address] [place], [place], [address] Phone: [phone]   Fax:  [phone]  Name: Patty [name] MRN: [id number] Date of Birth: [birth year]

## 2016-01-31 ENCOUNTER — Encounter: Payer: Self-pay | Admitting: Physical Therapy

## 2016-01-31 ENCOUNTER — Ambulatory Visit: Payer: 59 | Admitting: Physical Therapy

## 2016-01-31 DIAGNOSIS — M25672 Stiffness of left ankle, not elsewhere classified: Secondary | ICD-10-CM

## 2016-01-31 DIAGNOSIS — M25572 Pain in left ankle and joints of left foot: Secondary | ICD-10-CM | POA: Diagnosis not present

## 2016-01-31 DIAGNOSIS — M25571 Pain in right ankle and joints of right foot: Secondary | ICD-10-CM

## 2016-01-31 NOTE — Therapy (Signed)
[facility] [address], [place], [place], [address] Phone: [phone]   Fax:  [phone]  Physical Therapy Treatment  Patient Details  Name: Patty [name] MRN: [id number] Date of Birth: 31-Oct-1970 Referring Provider: Edythe Clarity, MD  Encounter Date: 01/31/2016      PT End of Session - 01/31/16 0841    Visit Number 3   Date for PT Re-Evaluation 03/20/16   PT Start Time 0803   PT Stop Time 0841   PT Time Calculation (min) 38 min   Activity Tolerance Patient tolerated treatment well   Behavior During Therapy [facility] for tasks assessed/performed      Past Medical History:  Diagnosis Date  . Anxiety     History reviewed. No pertinent surgical history.  There were no vitals filed for this visit.      Subjective Assessment - 01/31/16 0805    Subjective I felt great after therapy. I slept with a sock pulling my foot up and woke up with 50% less pain. I ran 4 miles on Sat and 14 on Sunday. I did good.    Pertinent History Marathon scheduled 03/12/16   Diagnostic tests x-ray: negative   Patient Stated Goals reduce pain to complete marathon (03/12/16)   Currently in Pain? Yes   Pain Score 4    Pain Location Ankle   Pain Orientation Right;Left   Pain Descriptors / Indicators Tightness;Discomfort   Pain Type Acute pain   Pain Onset More than a month ago   Pain Frequency Intermittent   Aggravating Factors  after running tightness in achilles tendon   Pain Relieving Factors stretching, rest, Meloxicam                         [facility] Adult PT Treatment/Exercise - 01/31/16 0001      Exercises   Exercises Ankle     Manual Therapy   Manual Therapy Soft tissue mobilization   Soft tissue mobilization bil. achilles tendon, gastroc, peroneal muscles, plantarfaciatis, and soleus     Ankle Exercises: Standing   BAPS Standing;Level 2;10 reps;Other (comment)   BAPS Limitations bil. DF/PF,  Inv/Ev, CC/CW   Heel Raises 10 reps  go up with bil. down with 1 slowly, bil.                PT Education - 01/31/16 0827    Education provided Yes   Education Details strengthening   Person(s) Educated Patient   Methods Explanation;Demonstration;Verbal cues;Handout   Comprehension Returned demonstration;Verbalized understanding          PT Short Term Goals - 01/31/16 0809      PT SHORT TERM GOAL #1   Title be independent in initial HEP   Time 4   Period Weeks   Status Achieved     PT SHORT TERM GOAL #2   Title report < or = to 3/10 achilles pain after running   Time 4   Period Weeks   Status On-going  4/10           PT Long Term Goals - 01/24/16 1014      PT LONG TERM GOAL #1   Title be independent in advanced HEP   Time 8   Period Weeks   Status New     PT LONG TERM GOAL #2   Title reduce FOTO to < or = to 3% limitation   Time 8   Period Weeks   Status  New     PT LONG TERM GOAL #3   Title report < or = to 2/10 ankle pain after running long distance   Time 8   Period Weeks   Status New               Plan - 01/31/16 5997    Clinical Impression Statement Patient pain has decreased by 50% to 4/10.  Patient has met STG #1 and #2.  Patient has increased mobility of bil. gastroc.  Patient is sleeping with her foot in DF which is keeping the gastroc on stretch to decrease pain in the morning.  Patient will benefit from skilled therapy to reduce pain and increase strength.    Rehab Potential Good   PT Frequency 2x / week   PT Duration 8 weeks   PT Treatment/Interventions ADLs/Self Care Home Management;Cryotherapy;Electrical Stimulation;Functional mobility training;Gait training;Ultrasound;Therapeutic activities;Moist Heat;Iontophoresis 14m/ml Dexamethasone;Therapeutic exercise;Neuromuscular re-education;Patient/family education;Passive range of motion;Vasopneumatic Device;Manual techniques;Dry needling   PT Next Visit Plan no ionto, no dry  needling, continue with strengthening, proprioceptive exercise and soft tissue work   PT Home Exercise Plan progress as needed   Consulted and Agree with Plan of Care Patient      Patient will benefit from skilled therapeutic intervention in order to improve the following deficits and impairments:  Decreased range of motion, Pain, Decreased activity tolerance  Visit Diagnosis: Pain in left ankle and joints of left foot  Stiffness of left ankle, not elsewhere classified  Pain in right ankle and joints of right foot     Problem List Patient Active Problem List   Diagnosis Date Noted  . Stress reaction of tibia 08/16/2015  . Hematuria 08/16/2015  . Patellar tendinitis of left knee 03/29/2015  . Toe pain, left 02/01/2015  . 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 Closed fracture of femur, neck ([place] 08/17/2014  . Displacement of lumbar intervertebral disc without myelopathy 07/07/2014  . Compression sided left femoral neck stress fracture 06/12/2014  . Avitaminosis D 03/10/2014  . Lumbar degenerative disc disease 03/06/2014  . Leg length discrepancy 03/06/2014  . Obsessive compulsive disorder 08/26/2013  . Generalized anxiety disorder 12/24/2011  . Arthralgia of hip 08/11/2011    CEarlie Counts PT 01/31/16 8:45 AM    [facility] [address] [place] [address]Phone: [phone]  Fax:  [phone] Name: Patty DEOLMRN: [id number] of Birth: [birth year]

## 2016-01-31 NOTE — Patient Instructions (Addendum)
Heel Raise: Bilateral (Standing)    Rise on balls of feet. Repeat __10__ times per set. Do __1__ sets per session. Do __1__ sessions per day. Up with 2 legs down with one.  http://orth.exer.us/39   Copyright  VHI. All rights reserved.   Range of Motion, Proprioception: Balance Board - Standing One Foot    Standing on board on the involved leg, rotate board clockwise, then counterclockwise. Repeat __10__ times  Do __1__ sessions per day.  http://cc.exer.us/33   Copyright  VHI. All rights reserved.  Ankle Invertors / Evertors    Place right leg on balance disc, firm foam or cushion. Keep knee slightly bent. Keep head and back straight. Do not move knee, only ankle. Invertors: Lean on outside edge of foot. Evertors: Lean on inside edge of foot. . Repeat _10___ times. Do __1__ sessions per day.  Copyright  VHI. All rights reserved.  Ankle Plantar flexors    Place right leg on balance disc, firm foam or cushion. Rock forward, pushing toes toward floor.then backward Keep head and back straight. Do not move knee, only ankle. . Repeat _10___ times. Do __1__ sessions per day.   Copyright  VHI. All rights reserved.  Toe Pick-Up    Use toes of left foot to pick up objects from floor, such as towel, Repeat _10___ times per set. Do __1__ sets per session. Do __1__ sessions per day.  http://orth.exer.us/48   Copyright  VHI. All rights reserved.  [facility] [address] [place], [place] [address] Phone # [phone] Fax [phone]

## 2016-02-02 ENCOUNTER — Encounter: Payer: Self-pay | Admitting: Physical Therapy

## 2016-02-03 ENCOUNTER — Ambulatory Visit: Payer: 59 | Admitting: Physical Therapy

## 2016-02-03 ENCOUNTER — Encounter: Payer: Self-pay | Admitting: Physical Therapy

## 2016-02-03 DIAGNOSIS — M25572 Pain in left ankle and joints of left foot: Secondary | ICD-10-CM | POA: Diagnosis not present

## 2016-02-03 DIAGNOSIS — M25571 Pain in right ankle and joints of right foot: Secondary | ICD-10-CM

## 2016-02-03 DIAGNOSIS — M25672 Stiffness of left ankle, not elsewhere classified: Secondary | ICD-10-CM

## 2016-02-03 NOTE — Therapy (Signed)
[facility] [address] [place], [place], [address] Phone: [phone]   Fax:  [phone]  Physical Therapy Treatment  Patient Details  Name: Patty [name] MRN: [id number] Date of Birth: 01/21/1971 Referring Provider: Edythe Clarity, MD  Encounter Date: 02/03/2016      PT End of Session - 02/03/16 1155    Visit Number 4   Date for PT Re-Evaluation 03/20/16   PT Start Time 1152  Patient came late   PT Stop Time 1230   PT Time Calculation (min) 38 min   Activity Tolerance Patient tolerated treatment well   Behavior During Therapy [facility] for tasks assessed/performed      Past Medical History:  Diagnosis Date  . Anxiety     History reviewed. No pertinent surgical history.  There were no vitals filed for this visit.      Subjective Assessment - 02/03/16 1154    Subjective Using the sock at night is better.  Pain is 80% better. I have been icing 3 times per day.    Pertinent History Marathon scheduled 03/12/16   Diagnostic tests x-ray: negative   Patient Stated Goals reduce pain to complete marathon (03/12/16)   Currently in Pain? No/denies                         [facility] Adult PT Treatment/Exercise - 02/03/16 0001      Manual Therapy   Manual Therapy Soft tissue mobilization   Joint Mobilization distraciton of bil. ankles grade 3; calcaneal rock bil. mobilization of bil. bones of feet   Soft tissue mobilization bil. achilles tendon, gastroc, peroneal muscles, plantarfaciatis, and soleus     Ankle Exercises: Aerobic   Elliptical 4 min level 1 back and forth every minute     Ankle Exercises: Standing   BAPS Standing;Level 2;10 reps;Other (comment)   BAPS Limitations bil. DF/PF, Inv/Ev, CC/CW   Other Standing Ankle Exercises stand on one leg and bring red band to hand for balance 20x                PT Education - 02/03/16 1225    Education provided No           PT Short Term Goals - 02/03/16 1156      PT SHORT TERM GOAL #1   Title be independent in initial HEP   Time 4   Period Weeks   Status Achieved     PT SHORT TERM GOAL #2   Title report < or = to 3/10 achilles pain after running   Time 4   Period Weeks   Status Achieved           PT Long Term Goals - 01/24/16 1014      PT LONG TERM GOAL #1   Title be independent in advanced HEP   Time 8   Period Weeks   Status New     PT LONG TERM GOAL #2   Title reduce FOTO to < or = to 3% limitation   Time 8   Period Weeks   Status New     PT LONG TERM GOAL #3   Title report < or = to 2/10 ankle pain after running long distance   Time 8   Period Weeks   Status New               Plan - 02/03/16 1156    Clinical Impression Statement Patient has  difficulty with control when using the BAPS board in stand and needs tactile cues.  Patient pain has decreased by 80%. Patient is tight in the right medial foot. Patient is consistent with her HEP. Patient will benefit from physical therapy to devrease pain.    Rehab Potential Good   PT Frequency 2x / week   PT Duration 8 weeks   PT Treatment/Interventions ADLs/Self Care Home Management;Cryotherapy;Electrical Stimulation;Functional mobility training;Gait training;Ultrasound;Therapeutic activities;Moist Heat;Iontophoresis 4mg /ml Dexamethasone;Therapeutic exercise;Neuromuscular re-education;Patient/family education;Passive range of motion;Vasopneumatic Device;Manual techniques;Dry needling   PT Next Visit Plan no ionto, no dry needling, continue with strengthening, proprioceptive exercise and soft tissue work   PT Home Exercise Plan progress as needed   Consulted and Agree with Plan of Care Patient      Patient will benefit from skilled therapeutic intervention in order to improve the following deficits and impairments:  Decreased range of motion, Pain, Decreased activity tolerance  Visit Diagnosis: Pain in left ankle and joints of  left foot  Stiffness of left ankle, not elsewhere classified  Pain in right ankle and joints of right foot     Problem List Patient Active Problem List   Diagnosis Date Noted  . Stress reaction of tibia 08/16/2015  . Hematuria 08/16/2015  . Patellar tendinitis of left knee 03/29/2015  . Toe pain, left 02/01/2015  . 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 Closed fracture of femur, neck ([place]) 08/17/2014  . Displacement of lumbar intervertebral disc without myelopathy 07/07/2014  . Compression sided left femoral neck stress fracture 06/12/2014  . Avitaminosis D 03/10/2014  . Lumbar degenerative disc disease 03/06/2014  . Leg length discrepancy 03/06/2014  . Obsessive compulsive disorder 08/26/2013  . Generalized anxiety disorder 12/24/2011  . Arthralgia of hip 08/11/2011    Earlie Counts, PT 02/03/16 12:27 PM   [facility] [address] [place], [place], [address] Phone: [phone]   Fax:  [phone]  Name: Patty [name] MRN: [id number] Date of Birth: [birth year]

## 2016-02-08 ENCOUNTER — Ambulatory Visit: Payer: 59

## 2016-02-09 ENCOUNTER — Encounter: Payer: Self-pay | Admitting: Physical Therapy

## 2016-02-09 ENCOUNTER — Ambulatory Visit: Payer: 59 | Attending: Sports Medicine | Admitting: Physical Therapy

## 2016-02-09 DIAGNOSIS — M25672 Stiffness of left ankle, not elsewhere classified: Secondary | ICD-10-CM

## 2016-02-09 DIAGNOSIS — M25571 Pain in right ankle and joints of right foot: Secondary | ICD-10-CM

## 2016-02-09 DIAGNOSIS — M25572 Pain in left ankle and joints of left foot: Secondary | ICD-10-CM | POA: Diagnosis not present

## 2016-02-09 NOTE — Therapy (Signed)
[facility] [address], [address] [place], [place], [address] Phone: [phone]   Fax:  [phone]  Physical Therapy Treatment  Patient Details  Name: Patty [name] MRN: [id number] Date of Birth: 1971-03-05 Referring Provider: Edythe Clarity, MD  Encounter Date: 02/09/2016      PT End of Session - 02/09/16 1154    Visit Number 5   Date for PT Re-Evaluation 03/20/16   PT Start Time 1104   PT Stop Time 1150   PT Time Calculation (min) 46 min   Activity Tolerance Patient tolerated treatment well   Behavior During Therapy [facility] for tasks assessed/performed      Past Medical History:  Diagnosis Date  . Anxiety     History reviewed. No pertinent surgical history.  There were no vitals filed for this visit.      Subjective Assessment - 02/09/16 1106    Subjective Pt reports new pain sight on Lateral Rt foot 8/10 pain. Lateral foot pain began on Monday after running. Pain in achellies is better reports little to no pain.    Pertinent History Marathon scheduled 03/12/16   Diagnostic tests x-ray: negative   Patient Stated Goals reduce pain to complete marathon (03/12/16)   Currently in Pain? Yes   Pain Score 1    Pain Location Ankle   Pain Orientation Right;Left   Pain Descriptors / Indicators Tightness   Pain Type Acute pain   Pain Onset More than a month ago   Pain Frequency Intermittent   Multiple Pain Sites Yes   Pain Score 8   Pain Location Ankle   Pain Orientation Right   Pain Descriptors / Indicators Aching   Pain Type Acute pain                         [facility] Adult PT Treatment/Exercise - 02/09/16 0001      Iontophoresis   Type of Iontophoresis Dexamethasone   Location Rt lateral foot   Dose 4mL   Time 6 hours     Manual Therapy   Manual Therapy Soft tissue mobilization;Taping   Manual therapy comments Tape    Joint Mobilization joint mobilization distraction   Soft  tissue mobilization Rt metacarples mobilization talo cruel distraction     Ankle Exercises: Aerobic   Elliptical 4 min level 1 back and forth every minute     Ankle Exercises: Standing   BAPS Standing;Level 2;10 reps;Other (comment)   BAPS Limitations bil. DF/PF, Inv/Ev, CC/CW   Other Standing Ankle Exercises stand on one leg and bring red band to hand for balance 20x   Other Standing Ankle Exercises 4 way ankle kicks 20 x2  AKA steamboats yellow Tband                  PT Short Term Goals - 02/09/16 1109      PT SHORT TERM GOAL #1   Title be independent in initial HEP   Time 4   Period Weeks   Status Achieved     PT SHORT TERM GOAL #2   Title report < or = to 3/10 achilles pain after running   Time 4   Period Weeks   Status Achieved           PT Long Term Goals - 02/09/16 1109      PT LONG TERM GOAL #1   Title be independent in advanced HEP   Time 8   Period Weeks  Status On-going     PT LONG TERM GOAL #2   Title reduce FOTO to < or = to 3% limitation   Time 8   Period Weeks   Status On-going     PT LONG TERM GOAL #3   Title report < or = to 2/10 ankle pain after running long distance   Time 8   Period Weeks   Status On-going               Plan - 02/09/16 1150    Clinical Impression Statement Pt having new pain today in Rt lateral foot after running on Monday. Pt reports little to no pain in acchedllies. Some Bil hip weakness evedident with 4 way ankle exercise decreasing single stance stability. Better control with BAPs board today. Tape to Bil ankle to facilitate eversion and anterior tib. Iono #1 to Rt lateral foot for pain.    Rehab Potential Good   PT Frequency 2x / week   PT Duration 8 weeks   PT Treatment/Interventions ADLs/Self Care Home Management;Cryotherapy;Electrical Stimulation;Functional mobility training;Gait training;Ultrasound;Therapeutic activities;Moist Heat;Iontophoresis 4mg /ml Dexamethasone;Therapeutic  exercise;Neuromuscular re-education;Patient/family education;Passive range of motion;Vasopneumatic Device;Manual techniques;Dry needling   PT Next Visit Plan Asses responce to iono, LE stability and strength, taping and soft tissue as needed   PT Home Exercise Plan progress as needed   Consulted and Agree with Plan of Care Patient      Patient will benefit from skilled therapeutic intervention in order to improve the following deficits and impairments:  Decreased range of motion, Pain, Decreased activity tolerance  Visit Diagnosis: Pain in left ankle and joints of left foot  Stiffness of left ankle, not elsewhere classified  Pain in right ankle and joints of right foot     Problem List Patient Active Problem List   Diagnosis Date Noted  . Stress reaction of tibia 08/16/2015  . Hematuria 08/16/2015  . Patellar tendinitis of left knee 03/29/2015  . Toe pain, left 02/01/2015  . 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 Closed fracture of femur, neck ([place]) 08/17/2014  . Displacement of lumbar intervertebral disc without myelopathy 07/07/2014  . Compression sided left femoral neck stress fracture 06/12/2014  . Avitaminosis D 03/10/2014  . Lumbar degenerative disc disease 03/06/2014  . Leg length discrepancy 03/06/2014  . Obsessive compulsive disorder 08/26/2013  . Generalized anxiety disorder 12/24/2011  . Arthralgia of hip 08/11/2011    Mikle Bosworth PTA 02/09/2016, 11:55 AM  [facility] [address] [place], [place], [address] Phone: [phone]   Fax:  [phone]  Name: Patty [name] MRN: [id number] Date of Birth: Aug 25, 1970

## 2016-02-15 ENCOUNTER — Encounter: Payer: Self-pay | Admitting: Physical Therapy

## 2016-02-15 ENCOUNTER — Ambulatory Visit: Payer: 59 | Admitting: Physical Therapy

## 2016-02-15 DIAGNOSIS — M25571 Pain in right ankle and joints of right foot: Secondary | ICD-10-CM

## 2016-02-15 DIAGNOSIS — M25572 Pain in left ankle and joints of left foot: Secondary | ICD-10-CM | POA: Diagnosis not present

## 2016-02-15 DIAGNOSIS — M25672 Stiffness of left ankle, not elsewhere classified: Secondary | ICD-10-CM

## 2016-02-15 NOTE — Therapy (Signed)
[facility] [address], [place], [place], [address] Phone: [phone]   Fax:  [phone]  Physical Therapy Treatment  Patient Details  Name: Patty [name] MRN: [id number] Date of Birth: [birth year] Referring Provider: Edythe Clarity, MD  Encounter Date: 02/15/2016      PT End of Session - 02/15/16 0855    Visit Number 6   Date for PT Re-Evaluation 03/20/16   PT Start Time 0849   PT Stop Time 0930   PT Time Calculation (min) 41 min   Activity Tolerance Patient tolerated treatment well   Behavior During Therapy [facility] for tasks assessed/performed      Past Medical History:  Diagnosis Date  . Anxiety     History reviewed. No pertinent surgical history.  There were no vitals filed for this visit.      Subjective Assessment - 02/15/16 0852    Subjective (P)  Some discomfort in back of heel this morning but not as bad as usuall. Worst pain is on outside of Rt foot. Reports no effect from Ionot patch, will not continue.    Pertinent History Marathon scheduled 03/12/16   Diagnostic tests x-ray: negative   Patient Stated Goals reduce pain to complete marathon (03/12/16)   Currently in Pain? Yes   Pain Score 2    Pain Location Ankle   Pain Orientation Right;Left   Pain Descriptors / Indicators Tightness   Pain Type Acute pain   Pain Onset More than a month ago   Pain Frequency Intermittent   Multiple Pain Sites Yes   Pain Score 8   Pain Location Ankle   Pain Orientation Right   Pain Descriptors / Indicators Aching   Pain Type Acute pain   Pain Onset 1 to 4 weeks ago   Pain Frequency Constant                         [facility] Adult PT Treatment/Exercise - 02/15/16 0001      Manual Therapy   Manual Therapy Joint mobilization;Taping   Manual therapy comments Tape for eversion and dorsi flexion   Joint Mobilization talocruel gliding     Ankle Exercises: Aerobic   Elliptical 4  min level 1 back and forth every minute     Ankle Exercises: Standing   SLS On blue mat 1 min x2  Verbal cues to keep foot flat   Side Shuffle (Round Trip) Side steeping with Yellow band   Other Standing Ankle Exercises 4 way ankle kicks 20 x2  AKA steamboats yellow Tband     Ankle Exercises: Seated   BAPS Sitting;Level 2;10 reps                  PT Short Term Goals - 02/15/16 0854      PT SHORT TERM GOAL #1   Title be independent in initial HEP   Time 4   Period Weeks   Status Achieved     PT SHORT TERM GOAL #2   Title report < or = to 3/10 achilles pain after running   Time 4   Period Weeks   Status Achieved           PT Long Term Goals - 02/15/16 0855      PT LONG TERM GOAL #1   Title be independent in advanced HEP   Time 8   Period Weeks   Status On-going     PT LONG TERM  GOAL #2   Title reduce FOTO to < or = to 3% limitation   Time 8   Period Weeks   Status On-going     PT LONG TERM GOAL #3   Title report < or = to 2/10 ankle pain after running long distance   Time 8   Period Weeks   Status On-going               Plan - 02/15/16 0943    Clinical Impression Statement Pt continues to have increased pain in Rt lateral foot. With RT single leg stance Pt needs VC to keep big toe down. Manaul talocruel joint mobilizationon Rt ankle to improve joint mobility. Will continue to increase LE stability and ankle strenght.    Rehab Potential Good   PT Frequency 2x / week   PT Duration 8 weeks   PT Treatment/Interventions ADLs/Self Care Home Management;Cryotherapy;Electrical Stimulation;Functional mobility training;Gait training;Ultrasound;Therapeutic activities;Moist Heat;Iontophoresis 4mg /ml Dexamethasone;Therapeutic exercise;Neuromuscular re-education;Patient/family education;Passive range of motion;Vasopneumatic Device;Manual techniques;Dry needling   PT Next Visit Plan LE stability and strength, taping and soft tissue as needed   PT Home  Exercise Plan progress as needed   Consulted and Agree with Plan of Care Patient      Patient will benefit from skilled therapeutic intervention in order to improve the following deficits and impairments:  Decreased range of motion, Pain, Decreased activity tolerance  Visit Diagnosis: Pain in left ankle and joints of left foot  Stiffness of left ankle, not elsewhere classified  Pain in right ankle and joints of right foot     Problem List Patient Active Problem List   Diagnosis Date Noted  . Stress reaction of tibia 08/16/2015  . Hematuria 08/16/2015  . Patellar tendinitis of left knee 03/29/2015  . Toe pain, left 02/01/2015  . 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 Closed fracture of femur, neck ([place]) 08/17/2014  . Displacement of lumbar intervertebral disc without myelopathy 07/07/2014  . Compression sided left femoral neck stress fracture 06/12/2014  . Avitaminosis D 03/10/2014  . Lumbar degenerative disc disease 03/06/2014  . Leg length discrepancy 03/06/2014  . Obsessive compulsive disorder 08/26/2013  . Generalized anxiety disorder 12/24/2011  . Arthralgia of hip 08/11/2011    Mikle Bosworth PTA 02/15/2016, 10:19 AM  [facility] [address] [place], [place], [address] Phone: [phone]   Fax:  [phone]  Name: Patty [name] MRN: [id number] Date of Birth: [birth year]

## 2016-02-17 ENCOUNTER — Encounter: Payer: Self-pay | Admitting: Physical Therapy

## 2016-02-17 ENCOUNTER — Ambulatory Visit: Payer: 59 | Admitting: Physical Therapy

## 2016-02-17 DIAGNOSIS — M25672 Stiffness of left ankle, not elsewhere classified: Secondary | ICD-10-CM

## 2016-02-17 DIAGNOSIS — M25571 Pain in right ankle and joints of right foot: Secondary | ICD-10-CM

## 2016-02-17 DIAGNOSIS — M25572 Pain in left ankle and joints of left foot: Secondary | ICD-10-CM | POA: Diagnosis not present

## 2016-02-17 NOTE — Therapy (Signed)
[facility] [address] [place], [place], [address] Phone: [phone]   Fax:  [phone]  Physical Therapy Treatment  Patient Details  Name: Patty [name] MRN: [id number] Date of Birth: 1970/11/02 Referring Provider: Edythe Clarity, MD  Encounter Date: 02/17/2016      PT End of Session - 02/17/16 1149    Visit Number 7   Date for PT Re-Evaluation 03/20/16   PT Start Time 1102   PT Stop Time 1146   PT Time Calculation (min) 44 min   Activity Tolerance Patient tolerated treatment well   Behavior During Therapy [facility] for tasks assessed/performed      Past Medical History:  Diagnosis Date  . Anxiety     History reviewed. No pertinent surgical history.  There were no vitals filed for this visit.      Subjective Assessment - 02/17/16 1103    Subjective Pt reports less lateral ankle pain today. Has been iceing and massaging at home. Reports increase Lt calf tightness today.    Pertinent History Marathon scheduled 03/12/16   Diagnostic tests x-ray: negative   Patient Stated Goals reduce pain to complete marathon (03/12/16)   Currently in Pain? Yes   Pain Score 5    Pain Location Ankle   Pain Orientation Right;Left   Pain Descriptors / Indicators Tightness   Pain Type Acute pain   Pain Onset More than a month ago   Multiple Pain Sites Yes   Pain Score 3   Pain Location Ankle   Pain Orientation Right   Pain Descriptors / Indicators Aching   Pain Type Acute pain   Pain Onset 1 to 4 weeks ago   Pain Frequency Constant                         [facility] Adult PT Treatment/Exercise - 02/17/16 0001      Manual Therapy   Manual Therapy Soft tissue mobilization   Manual therapy comments Tape for eversion and dorsi flexion   Joint Mobilization IASTM to Bil calves     Ankle Exercises: Aerobic   Elliptical 4 min level 1 back and forth every minute     Ankle Exercises: Standing    SLS On blue mat 1 min x2  with ball toss   Side Shuffle (Round Trip) Side steeping with Yellow band   Other Standing Ankle Exercises 4 way ankle kicks 20 x2  AKA steamboats yellow Tband     Ankle Exercises: Stretches   Soleus Stretch 2 reps;10 seconds   Gastroc Stretch 2 reps;10 seconds                PT Education - 02/17/16 1145    Education provided No          PT Short Term Goals - 02/15/16 0854      PT SHORT TERM GOAL #1   Title be independent in initial HEP   Time 4   Period Weeks   Status Achieved     PT SHORT TERM GOAL #2   Title report < or = to 3/10 achilles pain after running   Time 4   Period Weeks   Status Achieved           PT Long Term Goals - 02/15/16 [id number]      PT LONG TERM GOAL #1   Title be independent in advanced HEP   Time 8   Period Weeks   Status  On-going     PT LONG TERM GOAL #2   Title reduce FOTO to < or = to 3% limitation   Time 8   Period Weeks   Status On-going     PT LONG TERM GOAL #3   Title report < or = to 2/10 ankle pain after running long distance   Time 8   Period Weeks   Status On-going               Plan - 02/17/16 1150    Clinical Impression Statement Pt having less lateral foot pain with massage and ice. Pt ankle stability continues to improve and pt is able to tolerate increase resistance and difficulty with stability and balance exercies.    Rehab Potential Good   PT Frequency 2x / week   PT Duration 8 weeks   PT Treatment/Interventions ADLs/Self Care Home Management;Cryotherapy;Electrical Stimulation;Functional mobility training;Gait training;Ultrasound;Therapeutic activities;Moist Heat;Iontophoresis 4mg /ml Dexamethasone;Therapeutic exercise;Neuromuscular re-education;Patient/family education;Passive range of motion;Vasopneumatic Device;Manual techniques;Dry needling   PT Next Visit Plan LE stability and strength, taping and soft tissue as needed   Consulted and Agree with Plan of Care Patient       Patient will benefit from skilled therapeutic intervention in order to improve the following deficits and impairments:  Decreased range of motion, Pain, Decreased activity tolerance  Visit Diagnosis: Pain in left ankle and joints of left foot  Stiffness of left ankle, not elsewhere classified  Pain in right ankle and joints of right foot     Problem List Patient Active Problem List   Diagnosis Date Noted  . Stress reaction of tibia 08/16/2015  . Hematuria 08/16/2015  . Patellar tendinitis of left knee 03/29/2015  . Toe pain, left 02/01/2015  . 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 Closed fracture of femur, neck ([place]) 08/17/2014  . Displacement of lumbar intervertebral disc without myelopathy 07/07/2014  . Compression sided left femoral neck stress fracture 06/12/2014  . Avitaminosis D 03/10/2014  . Lumbar degenerative disc disease 03/06/2014  . Leg length discrepancy 03/06/2014  . Obsessive compulsive disorder 08/26/2013  . Generalized anxiety disorder 12/24/2011  . Arthralgia of hip 08/11/2011    Mikle Bosworth PTA 02/17/2016, 1:42 PM  [facility] [address], [place], [place], [address] Phone: [phone]   Fax:  [phone]  Name: Patty [name] MRN: [id number] Date of Birth: 01-07-1971

## 2016-02-22 ENCOUNTER — Encounter: Payer: Self-pay | Admitting: Physical Therapy

## 2016-02-23 ENCOUNTER — Ambulatory Visit: Payer: 59 | Admitting: Physical Therapy

## 2016-02-23 ENCOUNTER — Encounter: Payer: Self-pay | Admitting: Physical Therapy

## 2016-02-23 DIAGNOSIS — M25572 Pain in left ankle and joints of left foot: Secondary | ICD-10-CM

## 2016-02-23 DIAGNOSIS — M25672 Stiffness of left ankle, not elsewhere classified: Secondary | ICD-10-CM

## 2016-02-23 DIAGNOSIS — M25571 Pain in right ankle and joints of right foot: Secondary | ICD-10-CM

## 2016-02-23 NOTE — Therapy (Signed)
[facility] [address] [place], [place], [address] Phone: [phone]   Fax:  [phone]  Physical Therapy Treatment  Patient Details  Name: Patty [name] MRN: [id number] Date of Birth: 1971/03/26 Referring Provider: Edythe Clarity, MD  Encounter Date: 02/23/2016      PT End of Session - 02/23/16 0850    Visit Number 8   Date for PT Re-Evaluation 03/20/16   PT Start Time 0848   PT Stop Time 0927   PT Time Calculation (min) 39 min   Activity Tolerance Patient tolerated treatment well   Behavior During Therapy [facility] for tasks assessed/performed      Past Medical History:  Diagnosis Date  . Anxiety     History reviewed. No pertinent surgical history.  There were no vitals filed for this visit.      Subjective Assessment - 02/23/16 0849    Subjective Pt reports ankles doing well. No pain in Rt lateral foot. Pt reports lateral foot pain disappeared with new shoes. Denies pain in Bil ankles. Pt has started a warm up routine before running which she believes has also helped.    Pertinent History Marathon scheduled 03/12/16   Currently in Pain? No/denies   Multiple Pain Sites No                         [facility] Adult PT Treatment/Exercise - 02/23/16 0001      Ankle Exercises: Aerobic   Elliptical 4 min level 1 back and forth every minute  while therapist assesing pain.      Ankle Exercises: Standing   SLS On blue mat 1 min x2  with ball toss   Rocker Board 2 minutes  balance front/back side/side   Side Shuffle (Round Trip) Side steeping with sports cord   Other Standing Ankle Exercises 4 way ankle kicks 20 x2  band around both ankles 15x2 3 directions     Ankle Exercises: Stretches   Soleus Stretch 2 reps;10 seconds   Gastroc Stretch 2 reps;10 seconds     Ankle Exercises: Sidelying   Other Sidelying Ankle Exercises Standing golfers lift  10 each side                PT Education - 02/23/16 0910    Education provided Yes   Education Details LE strengthening   Person(s) Educated Patient   Methods Explanation;Verbal cues   Comprehension Verbalized understanding          PT Short Term Goals - 02/23/16 0850      PT SHORT TERM GOAL #1   Title be independent in initial HEP   Time 4   Period Weeks   Status Achieved     PT SHORT TERM GOAL #2   Title report < or = to 3/10 achilles pain after running   Time 4   Period Weeks   Status Achieved           PT Long Term Goals - 02/23/16 [id number]      PT LONG TERM GOAL #1   Title be independent in advanced HEP   Time 8   Period Weeks     PT LONG TERM GOAL #2   Title reduce FOTO to < or = to 3% limitation   Time 8   Period Weeks   Status On-going     PT LONG TERM GOAL #3   Title report < or = to 2/10 ankle pain  after running long distance   Time 8   Period Weeks   Status On-going               Plan - 02/23/16 0903    Clinical Impression Statement Pt reports no lateral foot pain after getting new shoes. Reports less pain in Bil ankles after running but continues to have some pain immediatley after and next morning. Some ankle instability evedent with resisted walking. Will continue to increase Bil LE strength and stability.    Rehab Potential Good   PT Frequency 2x / week   PT Duration 8 weeks   PT Treatment/Interventions ADLs/Self Care Home Management;Cryotherapy;Electrical Stimulation;Functional mobility training;Gait training;Ultrasound;Therapeutic activities;Moist Heat;Iontophoresis 4mg /ml Dexamethasone;Therapeutic exercise;Neuromuscular re-education;Patient/family education;Passive range of motion;Vasopneumatic Device;Manual techniques;Dry needling   PT Next Visit Plan LE stability and strength, taping and soft tissue as needed   Consulted and Agree with Plan of Care Patient      Patient will benefit from skilled therapeutic intervention in order to improve the following  deficits and impairments:  Decreased range of motion, Pain, Decreased activity tolerance  Visit Diagnosis: Pain in left ankle and joints of left foot  Stiffness of left ankle, not elsewhere classified  Pain in right ankle and joints of right foot     Problem List Patient Active Problem List   Diagnosis Date Noted  . Stress reaction of tibia 08/16/2015  . Hematuria 08/16/2015  . Patellar tendinitis of left knee 03/29/2015  . Toe pain, left 02/01/2015  . 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 Closed fracture of femur, neck ([place]) 08/17/2014  . Displacement of lumbar intervertebral disc without myelopathy 07/07/2014  . Compression sided left femoral neck stress fracture 06/12/2014  . Avitaminosis D 03/10/2014  . Lumbar degenerative disc disease 03/06/2014  . Leg length discrepancy 03/06/2014  . Obsessive compulsive disorder 08/26/2013  . Generalized anxiety disorder 12/24/2011  . Arthralgia of hip 08/11/2011    Mikle Bosworth PTA 02/23/2016, 9:35 AM  [facility] [address], [place], [place], [address] Phone: [phone]   Fax:  [phone]  Name: Patty [name] MRN: [id number] Date of Birth: [birth year]

## 2016-02-23 NOTE — Patient Instructions (Signed)
ABDUCTION: Standing - Resistance Band (Active)   Stand, feet flat. Against yellow resistance band, lift right leg out to side. Complete __2_ sets of _15__ repetitions.   ADDUCTION: Standing - Stable: Resistance Band (Active)   Stand, right leg out to side as far as possible. Against yellow resistance band, draw leg in across midline. Complete _2__ sets of _15__ repetitions.   Strengthening: Hip Flexion - Resisted   With tubing around left ankle, anchor behind, bring leg forward, keeping knee straight. Repeat _2___ times per set. Do __15__ sets per session.   Strengthening: Hip Extension - Resisted   With tubing around right ankle, face anchor and pull leg straight back. Repeat __2__ times per set. Do _15___ sets per session.Mikle Bosworth, PTA 02/23/16 9:08 AM  [facility] [address] [place], [place] [address] Phone # [phone] Fax [phone]

## 2016-02-24 ENCOUNTER — Encounter: Payer: Self-pay | Admitting: Physical Therapy

## 2016-02-25 ENCOUNTER — Ambulatory Visit (INDEPENDENT_AMBULATORY_CARE_PROVIDER_SITE_OTHER): Payer: 59 | Admitting: Sports Medicine

## 2016-02-25 ENCOUNTER — Encounter: Payer: Self-pay | Admitting: Sports Medicine

## 2016-02-25 DIAGNOSIS — M7662 Achilles tendinitis, left leg: Secondary | ICD-10-CM | POA: Diagnosis not present

## 2016-02-25 DIAGNOSIS — M7661 Achilles tendinitis, right leg: Secondary | ICD-10-CM | POA: Diagnosis not present

## 2016-02-25 NOTE — Progress Notes (Signed)
  Subjective:    CC: Follow-up  HPI: Bilateral Achilles tendinosis: Resolved with conservative measures.  Past medical history:  Negative.  See flowsheet/record as well for more information.  Surgical history: Negative.  See flowsheet/record as well for more information.  Family history: Negative.  See flowsheet/record as well for more information.  Social history: Negative.  See flowsheet/record as well for more information.  Allergies, and medications have been entered into the medical record, reviewed, and no changes needed.   Review of Systems: No fevers, chills, night sweats, weight loss, chest pain, or shortness of breath.   Objective:    General: Well Developed, well nourished, and in no acute distress.  Neuro: Alert and oriented x3, extra-ocular muscles intact, sensation grossly intact.  HEENT: Normocephalic, atraumatic, pupils equal round reactive to light, neck supple, no masses, no lymphadenopathy, thyroid nonpalpable.  Skin: Warm and dry, no rashes. Cardiac: Regular rate and rhythm, no murmurs rubs or gallops, no lower extremity edema.  Respiratory: Clear to auscultation bilaterally. Not using accessory muscles, speaking in full sentences.  Impression and Recommendations:    Achilles tendinosis Now resolved. She does have a race coming up on October 8. Continue heel lifts, stretches. She discontinued nitroglycerin very early, if persistent discomfort we can restart her nitroglycerin patches. She accidentally started a full patch and felt odd as expected.

## 2016-02-25 NOTE — Assessment & Plan Note (Signed)
Now resolved. She does have a race coming up on October 8. Continue heel lifts, stretches. She discontinued nitroglycerin very early, if persistent discomfort we can restart her nitroglycerin patches. She accidentally started a full patch and felt odd as expected.

## 2016-02-29 ENCOUNTER — Ambulatory Visit: Payer: 59 | Admitting: Physical Therapy

## 2016-02-29 DIAGNOSIS — M25571 Pain in right ankle and joints of right foot: Secondary | ICD-10-CM

## 2016-02-29 DIAGNOSIS — M25572 Pain in left ankle and joints of left foot: Secondary | ICD-10-CM

## 2016-02-29 DIAGNOSIS — M25672 Stiffness of left ankle, not elsewhere classified: Secondary | ICD-10-CM

## 2016-02-29 NOTE — Therapy (Signed)
[facility] [address], [place], [place], [address] Phone: [phone]   Fax:  [phone]  Physical Therapy Treatment  Patient Details  Name: Patty [name] MRN: [id number] Date of Birth: 1971/05/07 Referring Provider: Edythe Clarity, MD  Encounter Date: 02/29/2016      PT End of Session - 02/29/16 0902    Visit Number 9   Date for PT Re-Evaluation 03/20/16   PT Start Time 0800   PT Stop Time 0843   PT Time Calculation (min) 43 min   Activity Tolerance Patient tolerated treatment well      Past Medical History:  Diagnosis Date  . Anxiety     No past surgical history on file.  There were no vitals filed for this visit.      Subjective Assessment - 02/29/16 0803    Subjective Had 2x of left calf numbness over the weekend after running 20 miles and yesterday after running 6 miles but dissipated on it's own.     Pain Score 5    Pain Location Calf   Pain Orientation Left                         [facility] Adult PT Treatment/Exercise - 02/29/16 0001      Manual Therapy   Kinesiotex Facilitate Muscle  left Y formation heel to calf 80% stretch     Ankle Exercises: Stretches   Soleus Stretch 2 reps;10 seconds   Gastroc Stretch 2 reps;10 seconds  on rocker board     Ankle Exercises: Standing   Vector Stance Right;Left;Other (comment)  SLS with green band UE diagonals on right and left 15x 2   Heel Walk (Round Trip) 2 laps   Other Standing Ankle Exercises box eccentric lifts and lowers 10x right and left   Other Standing Ankle Exercises 4 way ankle kicks 20 x2  band around both ankles 15x2 3 directions                  PT Short Term Goals - 02/29/16 2116      PT SHORT TERM GOAL #1   Title be independent in initial HEP   Status Achieved     PT SHORT TERM GOAL #2   Title report < or = to 3/10 achilles pain after running   Status Achieved           PT  Long Term Goals - 02/29/16 2116      PT LONG TERM GOAL #1   Title be independent in advanced HEP   Time 8   Period Weeks   Status On-going     PT LONG TERM GOAL #2   Title reduce FOTO to < or = to 3% limitation   Time 8   Period Weeks   Status On-going     PT LONG TERM GOAL #3   Title report < or = to 2/10 ankle pain after running long distance   Time 8   Period Weeks   Status On-going               Plan - 02/29/16 2054    Clinical Impression Statement Patient reports new onset of left calf weakness and numbness after long distance running over the weekend which dissipated with before the completion of the run.  Decreased left calf muscle size  compared to right.  Patient has difficulty with with heel raises and lowers but cautioned patient to  avoid overdoing these eccentrics prior to her marathon on 10/6.  Patient reports good pain relief from taping left calf.  Patient continues to wear heel lifts and I recommended she continues to wear them until after her race.      PT Next Visit Plan LE stability and strength, taping and soft tissue as needed      Patient will benefit from skilled therapeutic intervention in order to improve the following deficits and impairments:     Visit Diagnosis: Pain in left ankle and joints of left foot  Stiffness of left ankle, not elsewhere classified  Pain in right ankle and joints of right foot     Problem List Patient Active Problem List   Diagnosis Date Noted  . Stress reaction of tibia 08/16/2015  . Hematuria 08/16/2015  . Patellar tendinitis of left knee 03/29/2015  . Toe pain, left 02/01/2015  . 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 Closed fracture of femur, neck ([place]) 08/17/2014  . Displacement of lumbar intervertebral disc without myelopathy 07/07/2014  . Compression sided left femoral neck stress fracture 06/12/2014  . Avitaminosis D 03/10/2014  . Lumbar degenerative disc disease 03/06/2014  . Leg length discrepancy  03/06/2014  . Obsessive compulsive disorder 08/26/2013  . Generalized anxiety disorder 12/24/2011  . Arthralgia of hip 08/11/2011   Ruben Im, PT 02/29/16 9:19 PM Phone: [phone] Fax: [phone]  Alvera Singh 02/29/2016, 9:18 PM  [facility] [address], [place], [place], [address] Phone: [phone]   Fax:  [phone]  Name: Patty [name] MRN: [id number] Date of Birth: 26-Jun-1970

## 2016-03-02 ENCOUNTER — Ambulatory Visit: Payer: 59

## 2016-03-02 DIAGNOSIS — M25572 Pain in left ankle and joints of left foot: Secondary | ICD-10-CM | POA: Diagnosis not present

## 2016-03-02 DIAGNOSIS — M25571 Pain in right ankle and joints of right foot: Secondary | ICD-10-CM

## 2016-03-02 DIAGNOSIS — M25672 Stiffness of left ankle, not elsewhere classified: Secondary | ICD-10-CM

## 2016-03-02 NOTE — Therapy (Addendum)
[facility] [address], [place], [place], [address] Phone: [phone]   Fax:  [phone]  Physical Therapy Treatment  Patient Details  Name: Patty [name] MRN: [id number] Date of Birth: 1971/01/19 Referring Provider: Edythe Clarity, MD  Encounter Date: 03/02/2016      PT End of Session - 03/02/16 1218    Visit Number 10   Date for PT Re-Evaluation 03/20/16   PT Start Time 5726   PT Stop Time 1227   PT Time Calculation (min) 39 min   Activity Tolerance Patient tolerated treatment well   Behavior During Therapy [facility] for tasks assessed/performed      Past Medical History:  Diagnosis Date  . Anxiety     History reviewed. No pertinent surgical history.  There were no vitals filed for this visit.      Subjective Assessment - 03/02/16 1150    Subjective I realized that my shoes were tied too tight and now that I have loosened the laces, the numbness is better.     Pertinent History Marathon scheduled 03/12/16   Patient Stated Goals reduce pain to complete marathon (03/12/16)   Currently in Pain? No/denies            [facility] PT Assessment - 03/02/16 0001      Assessment   Medical Diagnosis Lt achilles tendonitis     Prior Function   Level of Independence Independent   Leisure running- training for a marathon     Cognition   Overall Cognitive Status Within Functional Limits for tasks assessed     Observation/Other Assessments   Focus on Therapeutic Outcomes (FOTO)  1% limitation                     [facility] Adult PT Treatment/Exercise - 03/02/16 0001      Manual Therapy   Kinesiotex Facilitate Muscle  left Y formation heel to calf 80% stretch     Ankle Exercises: Stretches   Gastroc Stretch 2 reps;10 seconds  on rocker board     Ankle Exercises: Standing   Vector Stance Right;Left;Other (comment)  SLS with green band UE diagonals on right and left 15x 2.     SLS On  blue mat 1 min x2  with ball toss   Rocker Board 2 minutes  balance front/back side/side   Heel Walk (Round Trip) 2 laps   Other Standing Ankle Exercises box eccentric lifts and lowers 10x right and left   Other Standing Ankle Exercises 4 way ankle kicks 20 x2  band around both ankles 15x2 3 directions                  PT Short Term Goals - 02/29/16 2116      PT SHORT TERM GOAL #1   Title be independent in initial HEP   Status Achieved     PT SHORT TERM GOAL #2   Title report < or = to 3/10 achilles pain after running   Status Achieved           PT Long Term Goals - 03/02/16 1201      PT LONG TERM GOAL #2   Title reduce FOTO to < or = to 3% limitation   Status Achieved               Plan - 03/02/16 1152    Clinical Impression Statement Pt with reduced numbness in feet after loosening her shoe laces yesterday.  Pt reports improved symptoms with taping last session.  Pt feels almost 100% improved.  Pt will be placed on hold until after her marathon.  Pt will follow up with MD and return to PT by 03/20/16 if needed.  If not, PT will discharge.     Rehab Potential Good   PT Frequency 2x / week   PT Duration 8 weeks   PT Treatment/Interventions ADLs/Self Care Home Management;Cryotherapy;Electrical Stimulation;Functional mobility training;Gait training;Ultrasound;Therapeutic activities;Moist Heat;Iontophoresis 90m/ml Dexamethasone;Therapeutic exercise;Neuromuscular re-education;Patient/family education;Passive range of motion;Vasopneumatic Device;Manual techniques;Dry needling   PT Next Visit Plan Hold until 03/20/16.  D/C if pt doesnt return.     Consulted and Agree with Plan of Care Patient      Patient will benefit from skilled therapeutic intervention in order to improve the following deficits and impairments:  Decreased range of motion, Pain, Decreased activity tolerance  Visit Diagnosis: Pain in left ankle and joints of left foot  Stiffness of left  ankle, not elsewhere classified  Pain in right ankle and joints of right foot     Problem List Patient Active Problem List   Diagnosis Date Noted  . Stress reaction of tibia 08/16/2015  . Hematuria 08/16/2015  . Patellar tendinitis of left knee 03/29/2015  . Toe pain, left 02/01/2015  . 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 Closed fracture of femur, neck ([place] 08/17/2014  . Displacement of lumbar intervertebral disc without myelopathy 07/07/2014  . Compression sided left femoral neck stress fracture 06/12/2014  . Avitaminosis D 03/10/2014  . Lumbar degenerative disc disease 03/06/2014  . Leg length discrepancy 03/06/2014  . Obsessive compulsive disorder 08/26/2013  . Generalized anxiety disorder 12/24/2011  . Arthralgia of hip 08/11/2011     KSigurd Sos PT 03/02/16 12:28 PM PHYSICAL THERAPY DISCHARGE SUMMARY  Visits from Start of Care: 10  Current functional level related to goals / functional outcomes: See above for most current status.  Pt was to return to PT if needed after her marathon.  She didn't return.     Remaining deficits: See above for current status.     Education / Equipment: HEP Plan: Patient agrees to discharge.  Patient goals were met. Patient is being discharged due to meeting the stated rehab goals.  ?????        KSigurd Sos PT 03/30/16 9:36 AM   [facility] [address] [place] [address]Phone: [phone]  Fax:  [phone] Name: Patty PENAMRN: [id number] of Birth: 02/27/1971/07/04

## 2016-04-20 ENCOUNTER — Emergency Department (HOSPITAL_COMMUNITY): Payer: 59

## 2016-04-20 ENCOUNTER — Emergency Department (HOSPITAL_COMMUNITY)
Admission: EM | Admit: 2016-04-20 | Discharge: 2016-04-20 | Disposition: A | Discharge status: Home / Self Care | Payer: 59 | Attending: Emergency Medicine | Admitting: Emergency Medicine

## 2016-04-20 ENCOUNTER — Encounter (HOSPITAL_COMMUNITY): Payer: Self-pay | Admitting: Emergency Medicine

## 2016-04-20 DIAGNOSIS — Z79899 Other long term (current) drug therapy: Secondary | ICD-10-CM | POA: Insufficient documentation

## 2016-04-20 DIAGNOSIS — R079 Chest pain, unspecified: Secondary | ICD-10-CM

## 2016-04-20 LAB — BASIC METABOLIC PANEL
Anion gap: 7 (ref 5–15)
BUN: 13 mg/dL (ref 6–20)
CHLORIDE: 101 mmol/L (ref 101–111)
CO2: 27 mmol/L (ref 22–32)
CREATININE: 0.72 mg/dL (ref 0.44–1.00)
Calcium: 8.9 mg/dL (ref 8.9–10.3)
GFR calc non Af Amer: >60 mL/min (ref >60)
Glucose, Bld: 101 mg/dL — ABNORMAL HIGH (ref 65–99)
POTASSIUM: 3.7 mmol/L (ref 3.5–5.1)
Sodium: 135 mmol/L (ref 135–145)

## 2016-04-20 LAB — CBC
HEMATOCRIT: 39.4 % (ref 36.0–46.0)
Hemoglobin: 13.1 g/dL (ref 12.0–15.0)
MCH: 30.5 pg (ref 26.0–34.0)
MCHC: 33.2 g/dL (ref 30.0–36.0)
MCV: 91.8 fL (ref 78.0–100.0)
Platelets: 301 10*3/uL (ref 150–400)
RBC: 4.29 MIL/uL (ref 3.87–5.11)
RDW: 13.1 % (ref 11.5–15.5)
WBC: 10.2 10*3/uL (ref 4.0–10.5)

## 2016-04-20 LAB — I-STAT TROPONIN, ED
TROPONIN I, POC: 0 ng/mL (ref 0.00–0.08)
Troponin i, poc: 0 ng/mL (ref 0.00–0.08)

## 2016-04-20 MED ORDER — GI COCKTAIL ~~LOC~~
30.0000 mL | Freq: Once | ORAL | Status: AC
  Administered 2016-04-20: 30 mL via ORAL
  Filled 2016-04-20: qty 30

## 2016-04-20 NOTE — ED Notes (Signed)
Patient ambulatory and independent at discharge.  Verbalized understanding of discharge instructions. 

## 2016-04-20 NOTE — ED Notes (Signed)
Bed: WA25 Expected date:  Expected time:  Means of arrival:  Comments: EMS  

## 2016-04-20 NOTE — Discharge Instructions (Signed)
Continue prilosec.  May use TUMS or maalox. You can follow-up your primary care doctor. May follow-up with cardiology as desired as well-- call their office to make appt. Return here for new or worsening symptoms.

## 2016-04-20 NOTE — ED Triage Notes (Signed)
Per pt, states chest pain for over a week-states she thought it was heartburn-states no relief with OTC GERD med-told to come to ED for cardiac work up

## 2016-04-20 NOTE — ED Provider Notes (Signed)
[facility] DEPT Provider Note   CSN: [id number] Arrival date & time: 04/20/16  1350     History   Chief Complaint Chief Complaint  Patient presents with  . Chest Pain    HPI Patty [name] is a 45 y.o. female.  The history is provided by the patient and medical records.  Chest Pain      46 y.o. F with hx of anxiety, presenting to the ED for chest pain.  Patient states for the past week she has been experiencing, burning and "warmth" to mid-central chest without radiation.  She States symptoms are worse with lying flat or lying on her left side, better with sitting upright. She denies any associated shortness of breath, diaphoresis, nausea, or vomiting. States she was seen at urgent care and started on Prilosec for likely GERD which she has been taking without any significant improvement of her symptoms. Does report similar incidents about 20 years ago and was told her chest wall was "inflamed".  She denies any known cardiac history. No family cardiac history. She has never been a smoker.  Past Medical History:  Diagnosis Date  . Anxiety     Patient Active Problem List   Diagnosis Date Noted  . Stress reaction of tibia 08/16/2015  . Hematuria 08/16/2015  . Patellar tendinitis of left knee 03/29/2015  . Toe pain, left 02/01/2015  . 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 Closed fracture of femur, neck ([place]) 08/17/2014  . Displacement of lumbar intervertebral disc without myelopathy 07/07/2014  . Compression sided left femoral neck stress fracture 06/12/2014  . Avitaminosis D 03/10/2014  . Lumbar degenerative disc disease 03/06/2014  . Leg length discrepancy 03/06/2014  . Obsessive compulsive disorder 08/26/2013  . Generalized anxiety disorder 12/24/2011  . Arthralgia of hip 08/11/2011    History reviewed. No pertinent surgical history.  OB History    No data available       Home Medications    Prior to Admission medications   Medication Sig Start Date End  Date Taking? Authorizing Provider  Multiple Vitamin (MULTIVITAMIN) tablet Take 1 tablet by mouth.    Historical Provider, MD    Family History Family History  Problem Relation Age of Onset  . Hypertension Father     Social History Social History  Substance Use Topics  . Smoking status: Never Smoker  . Smokeless tobacco: Never Used  . Alcohol use No     Comment: Social  beer/wine     Allergies   Patient has no known allergies.   Review of Systems Review of Systems  Cardiovascular: Positive for chest pain.  All other systems reviewed and are negative.    Physical Exam Updated Vital Signs BP 111/82 (BP Location: Left Arm)   Pulse 65   Temp 98.4 F (36.9 C) (Oral)   Resp 16   SpO2 100%   Physical Exam  Constitutional: She is oriented to person, place, and time. She appears well-developed and well-nourished.  HENT:  Head: Normocephalic and atraumatic.  Mouth/Throat: Oropharynx is clear and moist.  Eyes: Conjunctivae and EOM are normal. Pupils are equal, round, and reactive to light.  Neck: Normal range of motion.  Cardiovascular: Normal rate, regular rhythm and normal heart sounds.   Pulmonary/Chest: Effort normal and breath sounds normal. No respiratory distress. She has no wheezes.  Abdominal: Soft. Bowel sounds are normal. There is no tenderness. There is no rebound.  Musculoskeletal: Normal range of motion.  Neurological: She is alert and oriented to person, place, and  time.  Skin: Skin is warm and dry.  Psychiatric: She has a normal mood and affect.  Nursing note and vitals reviewed.    ED Treatments / Results  Labs (all labs ordered are listed, but only abnormal results are displayed) Labs Reviewed  BASIC METABOLIC PANEL - Abnormal; Notable for the following:       Result Value   Glucose, Bld 101 (*)    All other components within normal limits  CBC  I-STAT TROPOININ, ED    EKG  EKG Interpretation  Date/Time:  Thursday April 20 2016  13:55:50 EST Ventricular Rate:  66 PR Interval:    QRS Duration: 80 QT Interval:  397 QTC Calculation: 416 R Axis:   68 Text Interpretation:  Sinus rhythm Right atrial enlargement Probable anteroseptal infarct, old ST elevation, consider early repolarization Confirmed by Hazle Coca [phone]) on 04/20/2016 5:19:08 PM       Radiology Dg Chest 2 View  Result Date: 04/20/2016 CLINICAL DATA:  Central chest pain during the day for 1 week. Shortness of breath at night. EXAM: CHEST  2 VIEW COMPARISON:  None. FINDINGS: Normal sized heart. Clear lungs. Minimal scoliosis and mild thoracic spine degenerative changes. IMPRESSION: No acute abnormality. Electronically Signed   By: Claudie Revering M.D.   On: 04/20/2016 14:34    Procedures Procedures (including critical care time)  Medications Ordered in ED Medications  gi cocktail (Maalox,Lidocaine,Donnatal) (not administered)     Initial Impression / Assessment and Plan / ED Course  I have reviewed the triage vital signs and the nursing notes.  Pertinent labs & imaging results that were available during my care of the patient were reviewed by me and considered in my medical decision making (see chart for details).  Clinical Course    45 year old female here with chest pain. This is been ongoing for the past week. Described as a burning and "hot" sensation in the middle of her chest. She does have history of GERD. Her EKG here is without acute ischemia, appears to be early repolarization.  Lab work reassuring.  CXR is clear.  Patient was treated here with GI cocktail with improvement of her symptoms. She is tolerating oral fluids well. Delta troponin was obtained, remains negative. Patient has low risk heart score of 1 (age).  She is PERC negative.  Symptoms are somewhat atypical for ACS or PE. Given their prolonged nature over the past week and negative evaluation here today, feel she is stable for discharge. She requested cardiology follow-up which  was given. Also recommended that she follow-up with her primary care doctor. Continue Prilosec, may use Tums or Maalox for breakthrough when needed.  Discussed plan with patient, she acknowledged understanding and agreed with plan of care.  Return precautions given for new or worsening symptoms.  Final Clinical Impressions(s) / ED Diagnoses   Final diagnoses:  Chest pain, unspecified type    New Prescriptions Discharge Medication List as of 04/20/2016  6:28 PM       Larene Pickett, PA-C 04/20/16 2111    Quintella Reichert, MD 04/21/16 [phone]

## 2016-05-08 ENCOUNTER — Ambulatory Visit: Payer: Self-pay | Admitting: Sports Medicine

## 2016-05-24 ENCOUNTER — Ambulatory Visit: Payer: 59 | Admitting: Cardiovascular Disease

## 2016-06-16 ENCOUNTER — Ambulatory Visit (INDEPENDENT_AMBULATORY_CARE_PROVIDER_SITE_OTHER): Payer: 59 | Admitting: Psychiatry

## 2016-06-16 DIAGNOSIS — F422 Mixed obsessional thoughts and acts: Secondary | ICD-10-CM

## 2016-06-16 DIAGNOSIS — F411 Generalized anxiety disorder: Secondary | ICD-10-CM | POA: Diagnosis not present

## 2016-06-16 NOTE — Progress Notes (Signed)
Patient ID: Patty [name], female   DOB: 1971/04/07, 46 y.o.   MRN: [id number]  [facility] [id number] Progress Note  Patty [name] [id number] 46 y.o.  06/16/2016 9:09 AM  Chief Complaint: GAD, and OCD.  History of Present Illness: Patty [name] presents today to follow up on her GAD and OCD.  History is suggestive of anxiety and OCD symptoms exacerbate under stress. In past it was related with relationship issues which is resolved now with her husband.  Her OCD is reasonably controlled .  She is doing marathons running looking forward to go to [employer]. She is not taking Zoloft for the last couple of months initially she had a headache when she stopped it but overall OCD anxiety and depression has not gotten worsened   Worries are not excessive Sleep is adequate. Anxiety is 3/10. 10 being extreme Modifying factors; exercise and running  aggravatign factor: marital problem in past  No psychotic symptoms or mania     Past Medical Family, Social History: patient is keeping busy and coping well.  Outpatient Encounter Prescriptions as of 06/16/2016  Medication Sig  . Multiple Vitamin (MULTIVITAMIN) tablet Take 1 tablet by mouth daily.   . Multiple Vitamin (MULTIVITAMIN) tablet Take by mouth.  Marland Kitchen omeprazole (PRILOSEC) 20 MG capsule Take 20 mg by mouth daily.    No facility-administered encounter medications on file as of 06/16/2016.      ROS: denies nausea, headache. Right lower leg pain. No chest pain  Physical Exam: Constitutional:  There were no vitals taken for this visit.  General Appearance: alert, oriented, no acute distress. No nausea or headaches  Musculoskeletal: Strength & Muscle Tone: within normal limits Gait & Station: normal Patient leans: N/A  Psychiatric: Speech (describe rate, volume, coherence, spontaneity, and abnormalities if any): normal  Thought Process (describe rate, content, abstract reasoning, and computation):  normal  Associations: Coherent and Relevant  Thoughts: normal  Language: normal and appropriate.  Fund of knowledge: above average   Mental Status: Orientation: oriented to person, place and time/date Mood & Affect: normal affect Attention Span & Concentration: normal  No suicidal toughts.  No psychotic symptoms  Assessment: Patient is doing baseline on meds.   Axis I: GAD, OCD STABLE  Plan: 1. Hold off from zoloft. Doing well  No change in mood . Doing better. Prescriptions none from here Anxiety< OCD not worsened.  FU 1 year or earlier     Merian Capron, MD 06/16/2016

## 2016-06-22 ENCOUNTER — Ambulatory Visit: Payer: 59 | Admitting: Cardiovascular Disease

## 2016-07-16 DIAGNOSIS — J0140 Acute pansinusitis, unspecified: Secondary | ICD-10-CM | POA: Diagnosis not present

## 2016-07-16 DIAGNOSIS — J029 Acute pharyngitis, unspecified: Secondary | ICD-10-CM | POA: Diagnosis not present

## 2016-07-17 ENCOUNTER — Ambulatory Visit (INDEPENDENT_AMBULATORY_CARE_PROVIDER_SITE_OTHER): Payer: 59 | Admitting: Sports Medicine

## 2016-07-17 ENCOUNTER — Encounter: Payer: Self-pay | Admitting: Sports Medicine

## 2016-07-17 ENCOUNTER — Ambulatory Visit (INDEPENDENT_AMBULATORY_CARE_PROVIDER_SITE_OTHER): Payer: 59

## 2016-07-17 DIAGNOSIS — M51369 Other intervertebral disc degeneration, lumbar region without mention of lumbar back pain or lower extremity pain: Secondary | ICD-10-CM

## 2016-07-17 DIAGNOSIS — M25561 Pain in right knee: Secondary | ICD-10-CM

## 2016-07-17 DIAGNOSIS — R200 Anesthesia of skin: Secondary | ICD-10-CM

## 2016-07-17 DIAGNOSIS — M5136 Other intervertebral disc degeneration, lumbar region: Secondary | ICD-10-CM

## 2016-07-17 NOTE — Assessment & Plan Note (Signed)
Did well in the past after a selective L4 nerve root block on the right.  Having a recurrence of pain, we do need new density imaging in anticipation of another epidural.

## 2016-07-17 NOTE — Progress Notes (Addendum)
   Subjective:    I'm seeing this patient as a consultation for:  Dr. Archie Sink Radiontchenko  CC: Knee pain  HPI: For 6 weeks this pleasant 46 Year old female has had an on pain that she localizes in the posterior knee joint line, described as a tingling type sensation, it's worse when driving, sitting, but does not hurt with physical activity or ambulation. Moderate, persistent, no mechanical pain. She does have a marathon coming up. She does have a history of lumbar degenerative disc disease, with radiculopathy that responded well to a right L4-L5 transforaminal epidural. This was done based off of the CT scan, because we may be proceeding to epidurals she has agreed to proceed with an updated MRI.  Past medical history:  Negative.  See flowsheet/record as well for more information.  Surgical history: Negative.  See flowsheet/record as well for more information.  Family history: Negative.  See flowsheet/record as well for more information.  Social history: Negative.  See flowsheet/record as well for more information.  Allergies, and medications have been entered into the medical record, reviewed, and no changes needed.   Review of Systems: No headache, visual changes, nausea, vomiting, diarrhea, constipation, dizziness, abdominal pain, skin rash, fevers, chills, night sweats, weight loss, swollen lymph nodes, body aches, joint swelling, muscle aches, chest pain, shortness of breath, mood changes, visual or auditory hallucinations.   Objective:   General: Well Developed, well nourished, and in no acute distress.  Neuro/Psych: Alert and oriented x3, extra-ocular muscles intact, able to move all 4 extremities, sensation grossly intact. Skin: Warm and dry, no rashes noted.  Respiratory: Not using accessory muscles, speaking in full sentences, trachea midline.  Cardiovascular: Pulses palpable, no extremity edema. Abdomen: Does not appear distended. Right Knee: Normal to inspection with no  erythema or effusion or obvious bony abnormalities. Palpation normal with no warmth or joint line tenderness or patellar tenderness or condyle tenderness. ROM normal in flexion and extension and lower leg rotation. Ligaments with solid consistent endpoints including ACL, PCL, LCL, MCL. Negative Mcmurray's and provocative meniscal tests. Non painful patellar compression. Patellar and quadriceps tendons unremarkable. Hamstring and quadriceps strength is normal.  Impression and Recommendations:   This case required medical decision making of moderate complexity.  Right knee pain Pain for greater than 6 weeks despite physician directed conservative measures. She is cleared for the marathon, in the meantime I would like her to work on stretching, massage, and wear a neoprene knee sleeve. We also discussed postural measures, pain is only when sitting and driving, and I do think some of her posterior pain could be radicular. X-rays, MRI.  Lumbar degenerative disc disease Did well in the past after a selective L4 nerve root block on the right.  Having a recurrence of pain, we do need new density imaging in anticipation of another epidural.

## 2016-07-17 NOTE — Assessment & Plan Note (Addendum)
Pain for greater than 6 weeks despite physician directed conservative measures. She is cleared for the marathon, in the meantime I would like her to work on stretching, massage, and wear a neoprene knee sleeve. We also discussed postural measures, pain is only when sitting and driving, and I do think some of her posterior pain could be radicular. X-rays, MRI.

## 2016-07-24 ENCOUNTER — Ambulatory Visit (INDEPENDENT_AMBULATORY_CARE_PROVIDER_SITE_OTHER): Payer: 59

## 2016-07-24 DIAGNOSIS — M5136 Other intervertebral disc degeneration, lumbar region: Secondary | ICD-10-CM

## 2016-07-24 DIAGNOSIS — M5416 Radiculopathy, lumbar region: Secondary | ICD-10-CM

## 2016-07-24 DIAGNOSIS — M85661 Other cyst of bone, right lower leg: Secondary | ICD-10-CM | POA: Diagnosis not present

## 2016-07-24 DIAGNOSIS — M25561 Pain in right knee: Secondary | ICD-10-CM

## 2016-07-24 DIAGNOSIS — M9428 Chondromalacia, other site: Secondary | ICD-10-CM

## 2016-07-27 DIAGNOSIS — R509 Fever, unspecified: Secondary | ICD-10-CM | POA: Diagnosis not present

## 2016-08-14 ENCOUNTER — Ambulatory Visit: Payer: Self-pay | Admitting: Sports Medicine

## 2016-08-15 ENCOUNTER — Ambulatory Visit: Payer: Self-pay | Admitting: Sports Medicine

## 2016-08-16 ENCOUNTER — Encounter: Payer: Self-pay | Admitting: Sports Medicine

## 2016-08-16 ENCOUNTER — Ambulatory Visit (INDEPENDENT_AMBULATORY_CARE_PROVIDER_SITE_OTHER): Payer: 59 | Admitting: Sports Medicine

## 2016-08-16 ENCOUNTER — Telehealth: Payer: Self-pay | Admitting: Sports Medicine

## 2016-08-16 DIAGNOSIS — M25561 Pain in right knee: Secondary | ICD-10-CM | POA: Diagnosis not present

## 2016-08-16 NOTE — Progress Notes (Signed)
  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 Pain is overall persistent. Taking no medications.  Past medical history:  Negative.  See flowsheet/record as well for more information.  Surgical history: Negative.  See flowsheet/record as well for more information.  Family history: Negative.  See flowsheet/record as well for more information.  Social history: Negative.  See flowsheet/record as well for more information.  Allergies, and medications have been entered into the medical record, reviewed, and no changes needed.   Review of Systems: No fevers, chills, night sweats, weight loss, chest pain, or shortness of breath.   Objective:    General: Well Developed, well nourished, and in no acute distress.  Neuro: Alert and oriented x3, extra-ocular muscles intact, sensation grossly intact.  HEENT: Normocephalic, atraumatic, pupils equal round reactive to light, neck supple, no masses, no lymphadenopathy, thyroid nonpalpable.  Skin: Warm and dry, no rashes. Cardiac: Regular rate and rhythm, no murmurs rubs or gallops, no lower extremity edema.  Respiratory: Clear to auscultation bilaterally. Not using accessory muscles, speaking in full sentences. Right Knee: Normal to inspection with no erythema or effusion or obvious bony abnormalities. Palpation normal with no warmth or joint line tenderness or patellar tenderness or condyle tenderness. ROM normal in flexion and extension and lower leg rotation. Ligaments with solid consistent endpoints including ACL, PCL, LCL, MCL. Negative Mcmurray's and provocative meniscal tests. Non painful patellar  compression. Patellar and quadriceps tendons unremarkable. Hamstring and quadriceps strength is normal.  Knee MRI shows lateral tibial chondromalacia  Impression and Recommendations:    Right knee pain MRI did show some tibial chondromalacia, lumbar spine MRI was completely normal. At this point she is going to just do topical Pennsaid and meloxicam which she has at home. I'm going to work on getting her approved for MonoVisc, as I do want to keep her out of marathon training for as short time as possible. She will give me a call if the oral and topicals dont work and we can go ahead and order the [place]. Physical therapy was tremendously effective before her last marathon, she will also let me know when she would like to start this.  I spent 25 minutes with this patient, greater than 50% was face-to-face time counseling regarding the above diagnoses

## 2016-08-16 NOTE — Telephone Encounter (Signed)
-----   Message from Silverio Decamp, MD sent at 08/16/2016  9:57 AM EDT ----- MonoVisc approval please, MRI confirmed osteoarthritis, right knee only. We are only going to need the approval, we can order it in the next couple of months if she needs it before her marathon. ___________________________________________ Gwen Her. Dianah Field, M.D., ABFM., CAQSM. Primary Care and [place] Instructor of [place] of [facility] of Medicine

## 2016-08-16 NOTE — Assessment & Plan Note (Signed)
MRI did show some tibial chondromalacia, lumbar spine MRI was completely normal. At this point she is going to just do topical Pennsaid and meloxicam which she has at home. I'm going to work on getting her approved for MonoVisc, as I do want to keep her out of marathon training for as short time as possible. She will give me a call if the oral and topicals dont work and we can go ahead and order the [place]. Physical therapy was tremendously effective before her last marathon, she will also let me know when she would like to start this.

## 2016-08-16 NOTE — Telephone Encounter (Signed)
Submitted for approval on Monovisc. Awaiting confirmation.  

## 2016-09-04 DIAGNOSIS — Z1231 Encounter for screening mammogram for malignant neoplasm of breast: Secondary | ICD-10-CM | POA: Diagnosis not present

## 2016-09-04 LAB — HM MAMMOGRAPHY: HM MAMMO: NORMAL (ref 0–4)

## 2016-09-20 NOTE — Telephone Encounter (Signed)
Received the following information from [employer] benefits investigation:   monovisc is covered. No PA required to [place] via [employer]. After deductible has been met, insurance will cover at 70% of the allowable. A coinsurance will be applied of 30% for the product and administration. RDE#[id number]  Called Pt and went over information. She is going to wait at this time.

## 2016-10-23 ENCOUNTER — Encounter: Payer: Self-pay | Admitting: Sports Medicine

## 2016-10-23 DIAGNOSIS — M84351A Stress fracture, right femur, initial encounter for fracture: Secondary | ICD-10-CM

## 2016-10-24 NOTE — Addendum Note (Signed)
Addended by: Silverio Decamp on: 10/24/2016 04:18 PM   Modules accepted: Orders

## 2016-10-25 ENCOUNTER — Other Ambulatory Visit: Payer: Self-pay | Admitting: Family Medicine

## 2016-10-25 DIAGNOSIS — M25551 Pain in right hip: Secondary | ICD-10-CM

## 2016-10-25 NOTE — Progress Notes (Signed)
Xray ordered per Physician recommendation based on denial of MRI from insurance.

## 2016-10-26 ENCOUNTER — Ambulatory Visit (INDEPENDENT_AMBULATORY_CARE_PROVIDER_SITE_OTHER): Payer: 59 | Admitting: Sports Medicine

## 2016-10-26 ENCOUNTER — Ambulatory Visit (INDEPENDENT_AMBULATORY_CARE_PROVIDER_SITE_OTHER): Payer: 59

## 2016-10-26 ENCOUNTER — Encounter: Payer: Self-pay | Admitting: Sports Medicine

## 2016-10-26 DIAGNOSIS — M25551 Pain in right hip: Secondary | ICD-10-CM | POA: Diagnosis not present

## 2016-10-26 NOTE — Progress Notes (Signed)
   Subjective:    I'm seeing this patient as a consultation for:  Dr. Parkston Sink Radiontchenko  CC: Right hip pain  HPI: This is a pleasant 46 year old female with a history of a left femoral neck stress fracture, marathon runner, for the past week she's had pain in her right anterior hip, luckily it has gone away, she is just scared that this might represent a femoral neck stress fracture. She does have a large full marathon coming up in [place].  Past medical history:  Negative.  See flowsheet/record as well for more information.  Surgical history: Negative.  See flowsheet/record as well for more information.  Family history: Negative.  See flowsheet/record as well for more information.  Social history: Negative.  See flowsheet/record as well for more information.  Allergies, and medications have been entered into the medical record, reviewed, and no changes needed.   Review of Systems: No headache, visual changes, nausea, vomiting, diarrhea, constipation, dizziness, abdominal pain, skin rash, fevers, chills, night sweats, weight loss, swollen lymph nodes, body aches, joint swelling, muscle aches, chest pain, shortness of breath, mood changes, visual or auditory hallucinations.   Objective:   General: Well Developed, well nourished, and in no acute distress.  Neuro/Psych: Alert and oriented x3, extra-ocular muscles intact, able to move all 4 extremities, sensation grossly intact. Skin: Warm and dry, no rashes noted.  Respiratory: Not using accessory muscles, speaking in full sentences, trachea midline.  Cardiovascular: Pulses palpable, no extremity edema. Abdomen: Does not appear distended. Right Hip: ROM IR: 60 Deg, ER: 60 Deg, Flexion: 120 Deg, Extension: 100 Deg, Abduction: 45 Deg, Adduction: 45 Deg Strength IR: 5/5, ER: 5/5, Flexion: 5/5, Extension: 5/5, Abduction: 5/5, Adduction: 5/5 Pelvic alignment unremarkable to inspection and palpation. Standing hip rotation and gait without  trendelenburg / unsteadiness. Greater trochanter without tenderness to palpation. No tenderness over piriformis. No SI joint tenderness and normal minimal SI movement. No hip pain with strong percussion of the heel  Hip x-rays personally reviewed and are negative.  Impression and Recommendations:   This case required medical decision making of moderate complexity.  Right hip pain Initially I had a concern for femoral neck stress fracture, her pain is gone, x-rays are negative, exam is benign. Ultimately an MRI would be needed to find a femoral neck stress fracture but considering her lack of symptoms I think we can go without it. I'm going to cancel the MRI, she can do her marathon without any restrictions. Adding hip flexor rehabilitation exercises in the meantime, and she does have tight hamstrings right worse than left that she will work on.

## 2016-10-26 NOTE — Assessment & Plan Note (Addendum)
Initially I had a concern for femoral neck stress fracture, her pain is gone, x-rays are negative, exam is benign. Ultimately an MRI would be needed to find a femoral neck stress fracture but considering her lack of symptoms I think we can go without it. I'm going to cancel the MRI, she can do her marathon without any restrictions. Adding hip flexor rehabilitation exercises in the meantime, and she does have tight hamstrings right worse than left that she will work on.

## 2016-11-11 ENCOUNTER — Encounter: Payer: Self-pay | Admitting: Sports Medicine

## 2016-11-11 DIAGNOSIS — M84353A Stress fracture, unspecified femur, initial encounter for fracture: Secondary | ICD-10-CM

## 2016-11-14 DIAGNOSIS — M791 Myalgia: Secondary | ICD-10-CM | POA: Diagnosis not present

## 2016-11-14 DIAGNOSIS — M7601 Gluteal tendinitis, right hip: Secondary | ICD-10-CM | POA: Diagnosis not present

## 2016-11-14 DIAGNOSIS — S76219A Strain of adductor muscle, fascia and tendon of unspecified thigh, initial encounter: Secondary | ICD-10-CM | POA: Diagnosis not present

## 2016-11-17 DIAGNOSIS — M7601 Gluteal tendinitis, right hip: Secondary | ICD-10-CM | POA: Diagnosis not present

## 2016-11-17 DIAGNOSIS — M791 Myalgia: Secondary | ICD-10-CM | POA: Diagnosis not present

## 2016-11-17 DIAGNOSIS — S76219A Strain of adductor muscle, fascia and tendon of unspecified thigh, initial encounter: Secondary | ICD-10-CM | POA: Diagnosis not present

## 2016-11-21 DIAGNOSIS — S76219A Strain of adductor muscle, fascia and tendon of unspecified thigh, initial encounter: Secondary | ICD-10-CM | POA: Diagnosis not present

## 2016-11-21 DIAGNOSIS — M7601 Gluteal tendinitis, right hip: Secondary | ICD-10-CM | POA: Diagnosis not present

## 2016-11-21 DIAGNOSIS — M791 Myalgia: Secondary | ICD-10-CM | POA: Diagnosis not present

## 2016-12-27 DIAGNOSIS — R12 Heartburn: Secondary | ICD-10-CM | POA: Diagnosis not present

## 2016-12-27 DIAGNOSIS — R0789 Other chest pain: Secondary | ICD-10-CM | POA: Diagnosis not present

## 2016-12-27 DIAGNOSIS — M8580 Other specified disorders of bone density and structure, unspecified site: Secondary | ICD-10-CM | POA: Diagnosis not present

## 2016-12-27 DIAGNOSIS — E559 Vitamin D deficiency, unspecified: Secondary | ICD-10-CM | POA: Diagnosis not present

## 2017-01-05 IMAGING — MR MR [PERSON_NAME] LOW W/O CM*R*
4 of 7 series · 19 of 40 positions shown · non-contrast
Comparison: None.

CLINICAL DATA: Right shin pain for 3 weeks

EXAM:
MRI OF LOWER RIGHT EXTREMITY WITHOUT CONTRAST
TECHNIQUE: Multiplanar, multisequence MR imaging of the right lower leg was
performed. No intravenous contrast was administered.

[Series 4: T1 fat-sat · axial · 5.0mm · 0.70mm/px · z∈[-207,+72]mm · 6 of 52 slices shown]
[im 1/52]
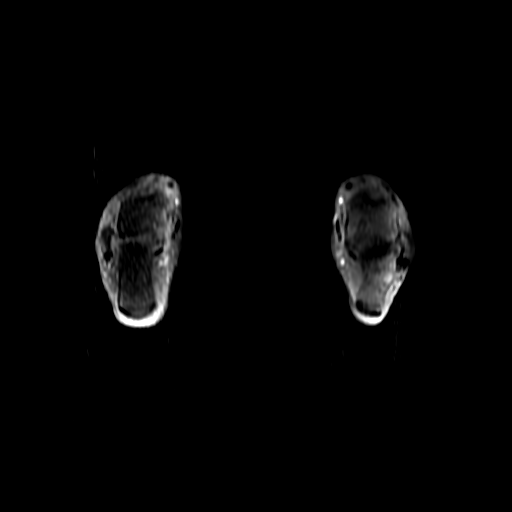
[im 8/52]
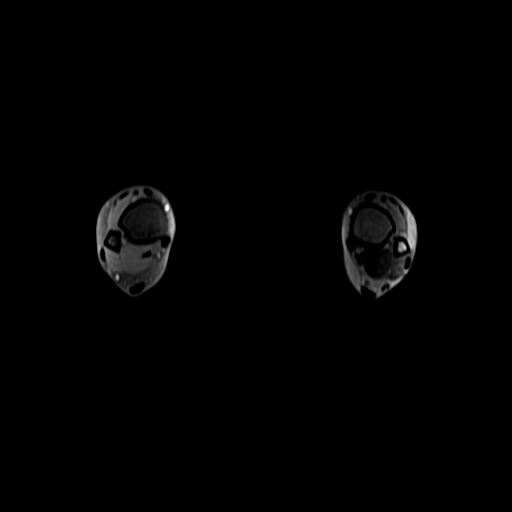
[im 15/52]
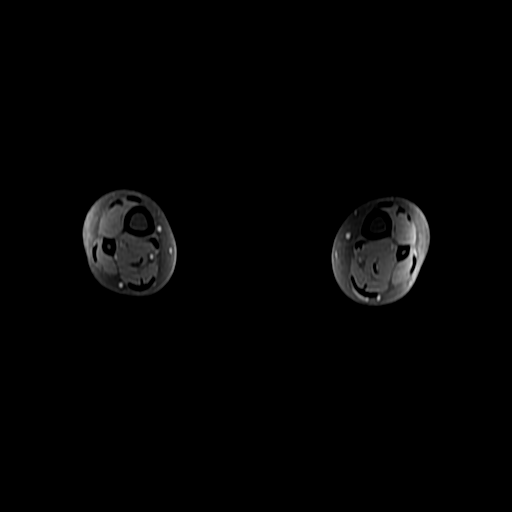
[im 22/52]
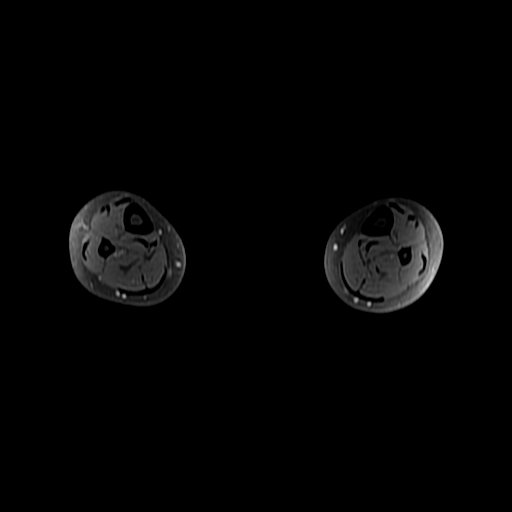
[im 30/52]
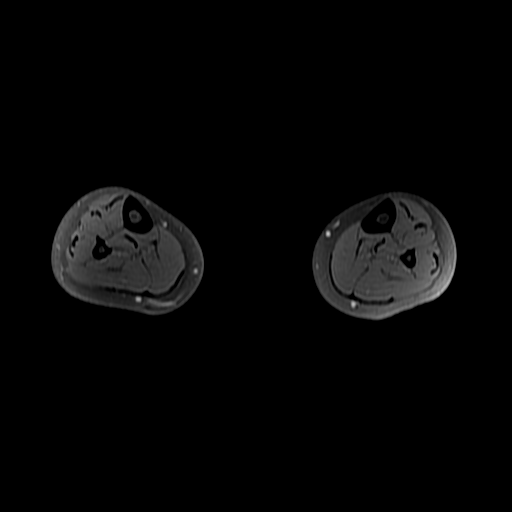
[im 44/52]
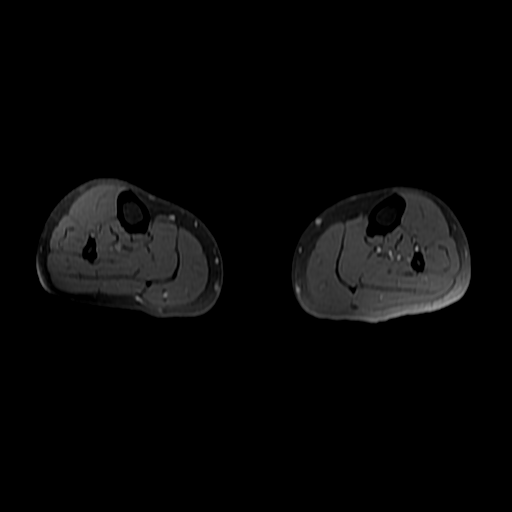

[Series 5: T1 · axial · 5.0mm · 0.70mm/px · z∈[-162,+72]mm · 3 of 48 slices shown (1 of 2)]
[im 8/48]
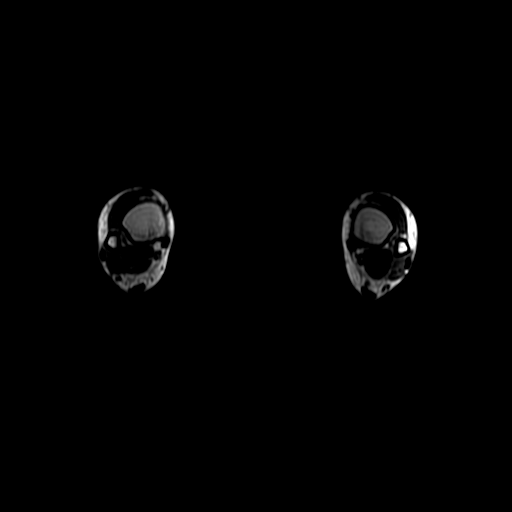
[im 24/48]
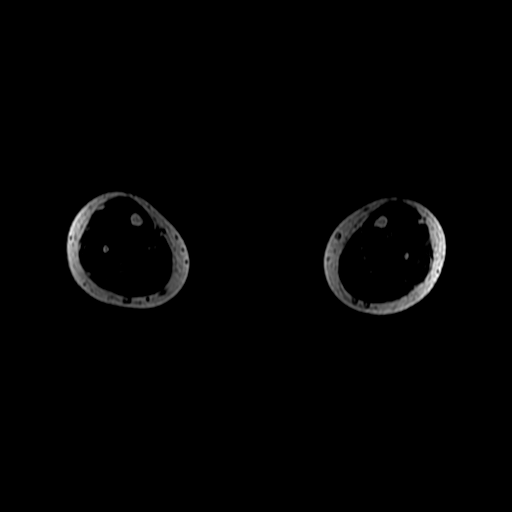
[im 40/48]
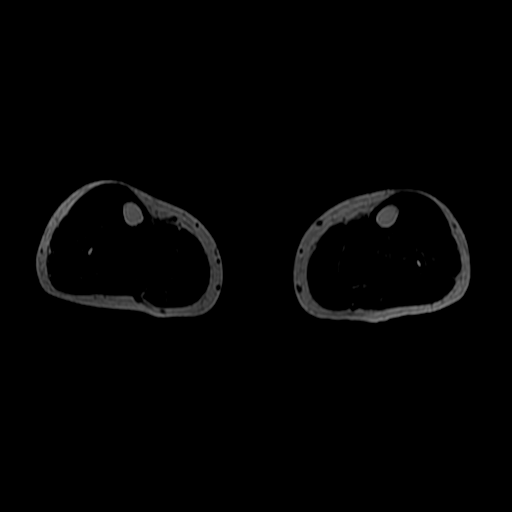

[Series 6: T2 fat-sat · axial · 5.0mm · 0.70mm/px · z∈[-207,+124]mm · 7 of 52 slices shown]
[im 1/52]
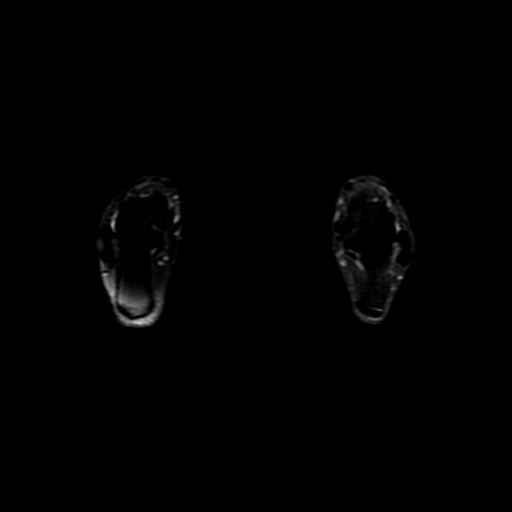
[im 9/52]
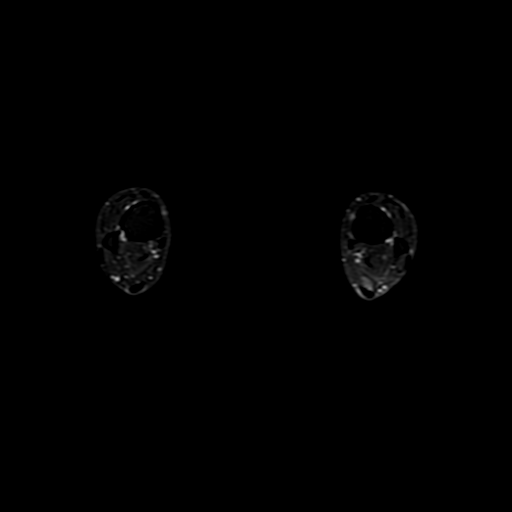
[im 18/52]
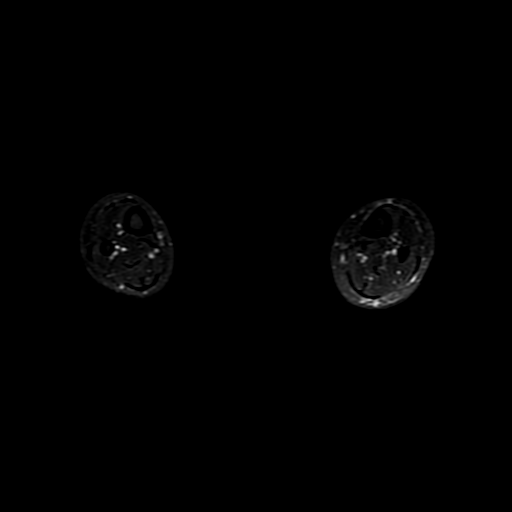
[im 26/52]
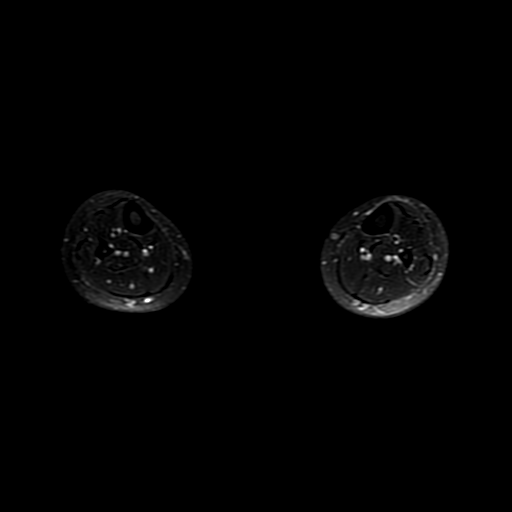
[im 35/52]
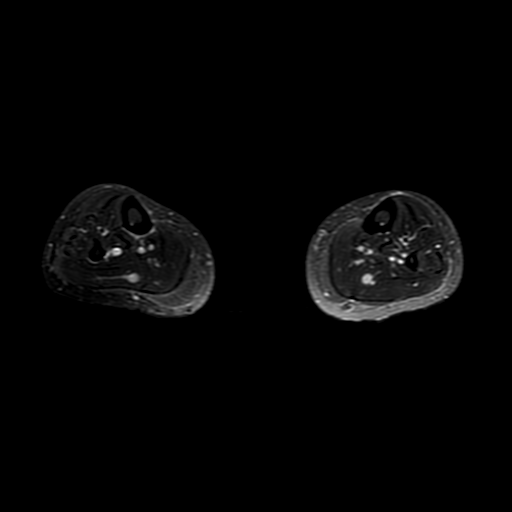
[im 43/52]
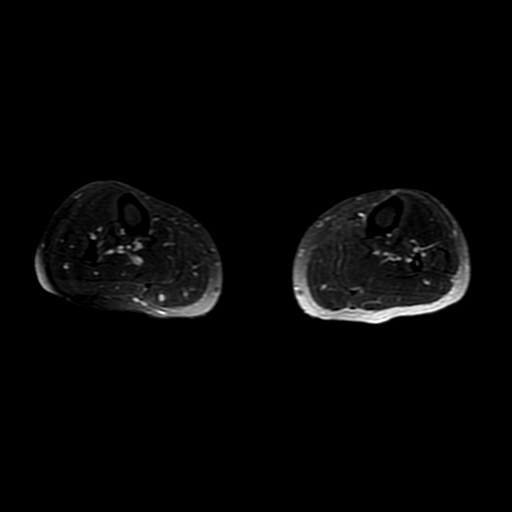
[im 52/52]
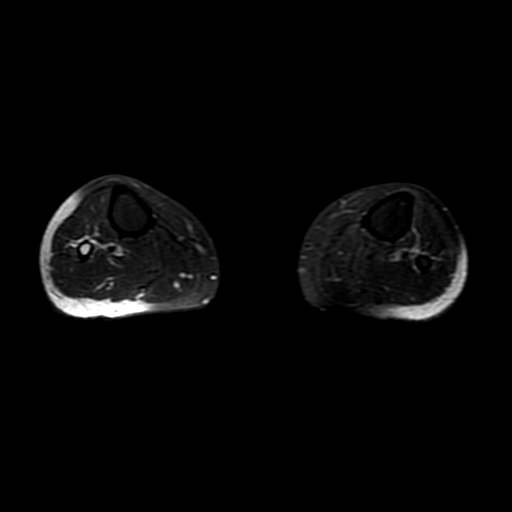

[Series 7: T1 · coronal · 4.0mm · 0.70mm/px · 3 of 29 slices shown (2 of 2)]
[im 1/29]
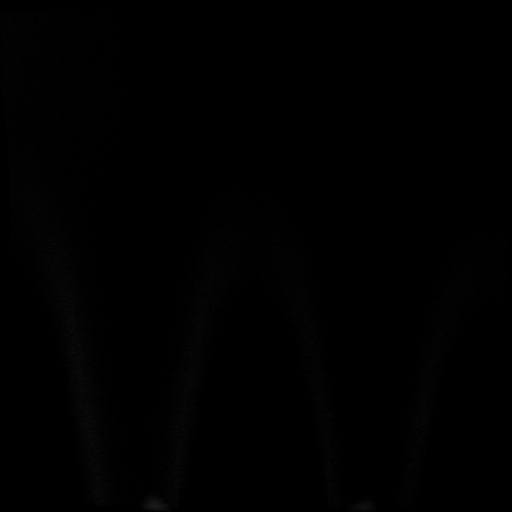
[im 19/29]
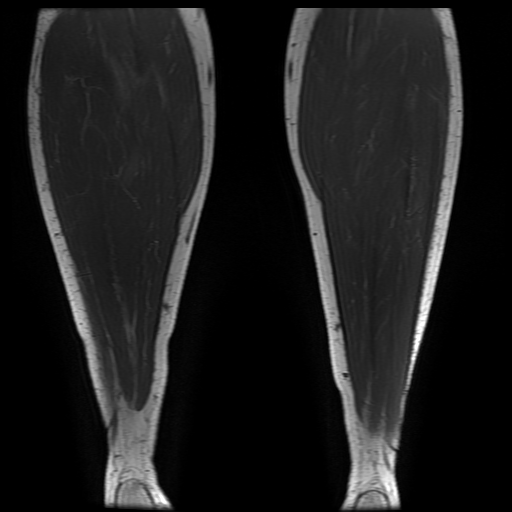
[im 29/29]
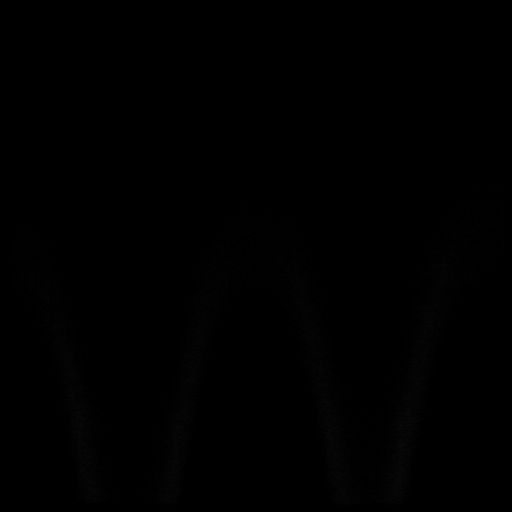

[19 of 40 positions shown; findings below may reference images not displayed]

FINDINGS: There is mild marrow edema in the right mid tibial diaphysis. There
is no periosteal reaction or bone destruction.

There is minimal marrow edema in the proximal left tibial diaphysis.

There is no other marrow signal abnormality. There is no acute
fracture or dislocation.

The muscles are normal. There is no muscle edema or atrophy. There
is no fluid collection or hematoma.
IMPRESSION: 1. Mild marrow edema in the right mid tibial diaphysis most
concerning for stress reaction without a stress fracture.
2. Minimal marrow edema in the left proximal tibial diaphysis
concerning for mild stress reaction without a stress fracture.

## 2017-01-16 DIAGNOSIS — M7601 Gluteal tendinitis, right hip: Secondary | ICD-10-CM | POA: Diagnosis not present

## 2017-01-16 DIAGNOSIS — M791 Myalgia: Secondary | ICD-10-CM | POA: Diagnosis not present

## 2017-01-16 DIAGNOSIS — S76219A Strain of adductor muscle, fascia and tendon of unspecified thigh, initial encounter: Secondary | ICD-10-CM | POA: Diagnosis not present

## 2017-01-23 DIAGNOSIS — S76219A Strain of adductor muscle, fascia and tendon of unspecified thigh, initial encounter: Secondary | ICD-10-CM | POA: Diagnosis not present

## 2017-01-23 DIAGNOSIS — M791 Myalgia: Secondary | ICD-10-CM | POA: Diagnosis not present

## 2017-01-23 DIAGNOSIS — M7601 Gluteal tendinitis, right hip: Secondary | ICD-10-CM | POA: Diagnosis not present

## 2017-01-30 DIAGNOSIS — M7601 Gluteal tendinitis, right hip: Secondary | ICD-10-CM | POA: Diagnosis not present

## 2017-01-30 DIAGNOSIS — M791 Myalgia: Secondary | ICD-10-CM | POA: Diagnosis not present

## 2017-01-30 DIAGNOSIS — S76219A Strain of adductor muscle, fascia and tendon of unspecified thigh, initial encounter: Secondary | ICD-10-CM | POA: Diagnosis not present

## 2017-01-30 DIAGNOSIS — Z01419 Encounter for gynecological examination (general) (routine) without abnormal findings: Secondary | ICD-10-CM | POA: Diagnosis not present

## 2017-01-30 DIAGNOSIS — Z0000 Encounter for general adult medical examination without abnormal findings: Secondary | ICD-10-CM | POA: Diagnosis not present

## 2017-01-30 LAB — BASIC METABOLIC PANEL
BUN: 12 (ref 4–21)
Creatinine: 0.8 (ref <=1.1)
GLUCOSE: 84
Potassium: 4.4 (ref 3.4–5.3)
Sodium: 136 — AB (ref 137–147)

## 2017-01-30 LAB — HEPATIC FUNCTION PANEL
ALK PHOS: 52 (ref 25–125)
ALT: 38 — AB (ref 7–35)
AST: 31 (ref 13–35)

## 2017-01-30 LAB — LIPID PANEL
Cholesterol: 182 (ref 0–200)
HDL: 90 — AB (ref 35–70)
LDL CALC: 79
TRIGLYCERIDES: 63 (ref 40–160)

## 2017-01-30 LAB — CBC AND DIFFERENTIAL
HCT: 39 (ref 36–46)
Hemoglobin: 12.5 (ref 12.0–16.0)
Platelets: 267 (ref 150–399)

## 2017-01-30 LAB — HEMOGLOBIN A1C: HEMOGLOBIN A1C: 5.6

## 2017-02-06 DIAGNOSIS — M791 Myalgia: Secondary | ICD-10-CM | POA: Diagnosis not present

## 2017-02-06 DIAGNOSIS — S76219A Strain of adductor muscle, fascia and tendon of unspecified thigh, initial encounter: Secondary | ICD-10-CM | POA: Diagnosis not present

## 2017-02-06 DIAGNOSIS — M7601 Gluteal tendinitis, right hip: Secondary | ICD-10-CM | POA: Diagnosis not present

## 2017-02-12 DIAGNOSIS — M791 Myalgia: Secondary | ICD-10-CM | POA: Diagnosis not present

## 2017-02-12 DIAGNOSIS — M7601 Gluteal tendinitis, right hip: Secondary | ICD-10-CM | POA: Diagnosis not present

## 2017-02-12 DIAGNOSIS — S76219A Strain of adductor muscle, fascia and tendon of unspecified thigh, initial encounter: Secondary | ICD-10-CM | POA: Diagnosis not present

## 2017-03-03 DIAGNOSIS — Z23 Encounter for immunization: Secondary | ICD-10-CM | POA: Diagnosis not present

## 2017-03-19 ENCOUNTER — Encounter: Payer: Self-pay | Admitting: Family Medicine

## 2017-03-19 ENCOUNTER — Ambulatory Visit (INDEPENDENT_AMBULATORY_CARE_PROVIDER_SITE_OTHER): Payer: 59 | Admitting: Family Medicine

## 2017-03-19 VITALS — BP 90/60 | HR 58 | Ht 67.5 in | Wt 125.8 lb

## 2017-03-19 DIAGNOSIS — M546 Pain in thoracic spine: Secondary | ICD-10-CM | POA: Diagnosis not present

## 2017-03-19 MED ORDER — MELOXICAM 15 MG PO TABS: 15.0000 mg | ORAL_TABLET | Freq: Every day | ORAL | 0 refills | Status: DC

## 2017-03-19 NOTE — Patient Instructions (Signed)
Start the meloxicam. Try the exercises. Let me know if not improving in 2-3 weeks.  Take care,  Dr Jerline Pain

## 2017-03-19 NOTE — Progress Notes (Signed)
Subjective:  Patty [name] is a 46 y.o. female who presents today with a chief complaint of back pain.   HPI:  Back Pain, acute issue Started about a week ago and has worsened over that time. No obvious precipitating events. She is a runner and ran in a marathon about 3-4 weeks ago but did not suffer any injuries or falls. Pain is worse in the morning. Pain is located in her mid back and does not radiate. Tried ibuprofen which helps minimally. No urinary symptoms. No weakness or numbness.   ROS: Per HPI, otherwise a 14 point review of systems was performed and was negative  PMH:  The following were reviewed and entered/updated in epic: Past Medical History:  Diagnosis Date  . Anxiety   . History of recurrent UTIs    Patient Active Problem List   Diagnosis Date Noted  . Hematuria 08/16/2015  . 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 Displacement of lumbar intervertebral disc without myelopathy 07/07/2014  . Compression sided left femoral neck stress fracture 06/12/2014  . Avitaminosis D 03/10/2014  . Lumbar degenerative disc disease 03/06/2014  . Leg length discrepancy 03/06/2014  . Obsessive compulsive disorder 08/26/2013  . Generalized anxiety disorder 12/24/2011   Past Surgical History:  Procedure Laterality Date  . SEPTOPLASTY      Family History  Problem Relation Age of Onset  . Hypertension Father   . Heart attack Maternal Grandmother   . Heart attack Maternal Grandfather     Medications- reviewed and updated Current Outpatient Prescriptions  Medication Sig Dispense Refill  . calcium gluconate 500 MG tablet Take 1 tablet by mouth daily.    . Multiple Vitamin (MULTIVITAMIN) tablet Take 1 tablet by mouth daily.     . Vitamin D, Ergocalciferol, 2000 units CAPS Take by mouth.    . meloxicam (MOBIC) 15 MG tablet Take 1 tablet (15 mg total) by mouth daily. 30 tablet 0   No current facility-administered medications for this visit.     Allergies-reviewed and  updated No Known Allergies  Social History   Social History  . Marital status: Married    Spouse name: N/A  . Number of children: 2  . Years of education: N/A   Occupational History  . Account Manager    Social History Main Topics  . Smoking status: Never Smoker  . Smokeless tobacco: Never Used  . Alcohol use No     Comment: Social  beer/wine  . Drug use: No  . Sexual activity: Yes    Partners: Male    Birth control/ protection: None   Other Topics Concern  . None   Social History Narrative  . None   Objective:  Physical Exam: BP 90/60   Pulse (!) 58   Ht 5' 7.5" (1.715 m)   Wt 125 lb 12.8 oz (57.1 kg)   SpO2 98%   BMI 19.41 kg/m   Gen: NAD, resting comfortably CV: RRR with no murmurs appreciated Pulm: NWOB, CTAB with no crackles, wheezes, or rhonchi GI: Normal bowel sounds present. Soft, Nontender, Nondistended. MSK:  -Back: No deformities. FROM. Tender to palpation along thoracic paraspinal muscle groups. -LE: Normal strength and sensation. Skin: Warm, dry Neuro: Grossly normal, moves all extremities Psych: Normal affect and thought content  Assessment/Plan:  Thoracic Back Pain No red flag signs or symptoms. Likely strain of paraspinal muscles. Offered muscle relaxer, however patient deferred. Start meloxicam 15 mg daily for 2 weeks. Encouraged her to use heating pads and topical ointments  as tolerated. Discussed home exercise program. Strict return precautions reviewed. Follow-up as needed. Consider trigger point injection and/or muscle relaxer therapy if not improving.  Preventative Healthcare UTD on flu shot. Reports normal pap and recent labs - will obtain her records.   Algis Greenhouse. Jerline Pain, MD 03/19/2017 3:17 PM

## 2017-04-23 ENCOUNTER — Encounter: Payer: Self-pay | Admitting: Family Medicine

## 2017-05-26 DIAGNOSIS — B9689 Other specified bacterial agents as the cause of diseases classified elsewhere: Secondary | ICD-10-CM | POA: Diagnosis not present

## 2017-05-26 DIAGNOSIS — J019 Acute sinusitis, unspecified: Secondary | ICD-10-CM | POA: Diagnosis not present

## 2017-05-31 DIAGNOSIS — J018 Other acute sinusitis: Secondary | ICD-10-CM | POA: Diagnosis not present

## 2017-07-09 DIAGNOSIS — M2241 Chondromalacia patellae, right knee: Secondary | ICD-10-CM | POA: Diagnosis not present

## 2017-07-09 DIAGNOSIS — M25561 Pain in right knee: Secondary | ICD-10-CM | POA: Diagnosis not present

## 2017-07-09 DIAGNOSIS — M2242 Chondromalacia patellae, left knee: Secondary | ICD-10-CM | POA: Diagnosis not present

## 2017-07-13 DIAGNOSIS — M25561 Pain in right knee: Secondary | ICD-10-CM | POA: Diagnosis not present

## 2017-07-13 DIAGNOSIS — M2241 Chondromalacia patellae, right knee: Secondary | ICD-10-CM | POA: Diagnosis not present

## 2017-07-13 DIAGNOSIS — M2242 Chondromalacia patellae, left knee: Secondary | ICD-10-CM | POA: Diagnosis not present

## 2017-07-17 DIAGNOSIS — M2242 Chondromalacia patellae, left knee: Secondary | ICD-10-CM | POA: Diagnosis not present

## 2017-07-17 DIAGNOSIS — M25561 Pain in right knee: Secondary | ICD-10-CM | POA: Diagnosis not present

## 2017-07-17 DIAGNOSIS — M2241 Chondromalacia patellae, right knee: Secondary | ICD-10-CM | POA: Diagnosis not present

## 2017-07-25 DIAGNOSIS — M25561 Pain in right knee: Secondary | ICD-10-CM | POA: Diagnosis not present

## 2017-07-25 DIAGNOSIS — M2241 Chondromalacia patellae, right knee: Secondary | ICD-10-CM | POA: Diagnosis not present

## 2017-07-25 DIAGNOSIS — M2242 Chondromalacia patellae, left knee: Secondary | ICD-10-CM | POA: Diagnosis not present

## 2017-07-31 DIAGNOSIS — M2242 Chondromalacia patellae, left knee: Secondary | ICD-10-CM | POA: Diagnosis not present

## 2017-07-31 DIAGNOSIS — M2241 Chondromalacia patellae, right knee: Secondary | ICD-10-CM | POA: Diagnosis not present

## 2017-07-31 DIAGNOSIS — M25561 Pain in right knee: Secondary | ICD-10-CM | POA: Diagnosis not present

## 2017-08-07 DIAGNOSIS — M2241 Chondromalacia patellae, right knee: Secondary | ICD-10-CM | POA: Diagnosis not present

## 2017-08-07 DIAGNOSIS — M2242 Chondromalacia patellae, left knee: Secondary | ICD-10-CM | POA: Diagnosis not present

## 2017-08-07 DIAGNOSIS — M25561 Pain in right knee: Secondary | ICD-10-CM | POA: Diagnosis not present

## 2017-08-09 DIAGNOSIS — M2242 Chondromalacia patellae, left knee: Secondary | ICD-10-CM | POA: Diagnosis not present

## 2017-08-09 DIAGNOSIS — M2241 Chondromalacia patellae, right knee: Secondary | ICD-10-CM | POA: Diagnosis not present

## 2017-08-09 DIAGNOSIS — M25561 Pain in right knee: Secondary | ICD-10-CM | POA: Diagnosis not present

## 2017-08-14 DIAGNOSIS — M25561 Pain in right knee: Secondary | ICD-10-CM | POA: Diagnosis not present

## 2017-08-14 DIAGNOSIS — M2242 Chondromalacia patellae, left knee: Secondary | ICD-10-CM | POA: Diagnosis not present

## 2017-08-14 DIAGNOSIS — M2241 Chondromalacia patellae, right knee: Secondary | ICD-10-CM | POA: Diagnosis not present

## 2017-08-15 ENCOUNTER — Other Ambulatory Visit: Payer: Self-pay | Admitting: Family Medicine

## 2017-08-21 ENCOUNTER — Encounter: Payer: Self-pay | Admitting: Sports Medicine

## 2017-08-21 ENCOUNTER — Ambulatory Visit: Payer: 59 | Admitting: Sports Medicine

## 2017-08-21 DIAGNOSIS — M170 Bilateral primary osteoarthritis of knee: Secondary | ICD-10-CM

## 2017-08-21 MED ORDER — DICLOFENAC SODIUM 2 % TD SOLN: 2.0000 | Freq: Two times a day (BID) | TRANSDERMAL | 11 refills | Status: DC

## 2017-08-21 MED ORDER — CELECOXIB 200 MG PO CAPS: ORAL_CAPSULE | ORAL | 2 refills | Status: DC

## 2017-08-21 NOTE — Progress Notes (Signed)
Subjective:    CC: Knee pain  HPI: Patty [name] is a pleasant 47 year old female, I have seen her a year ago for knee osteoarthritis on the right, ultimately we got her approved for Monovisc, we never followed through with it.  She is having recurrence of pain, left knee this time, moderate, persistent, posterior medial joint line with gelling, no mechanical symptoms, no trauma, no swelling.  She is interested in proceeding again with Visco supplementation but wants to wait until after her marathon on April 28.  I reviewed the past medical history, family history, social history, surgical history, and allergies today and no changes were needed.  Please see the problem list section below in epic for further details.  Past Medical History: Past Medical History:  Diagnosis Date  . Anxiety   . History of recurrent UTIs    Past Surgical History: Past Surgical History:  Procedure Laterality Date  . ENDOMETRIAL ABLATION  10/16/2013  . SEPTOPLASTY     Social History: Social History   Socioeconomic History  . Marital status: Married    Spouse name: None  . Number of children: 2  . Years of education: None  . Highest education level: None  Social Needs  . Financial resource strain: None  . Food insecurity - worry: None  . Food insecurity - inability: None  . Transportation needs - medical: None  . Transportation needs - non-medical: None  Occupational History  . Occupation: Passenger transport manager  Tobacco Use  . Smoking status: Never Smoker  . Smokeless tobacco: Never Used  Substance and Sexual Activity  . Alcohol use: No    Comment: Social  beer/wine  . Drug use: No  . Sexual activity: Yes    Partners: Male    Birth control/protection: None  Other Topics Concern  . None  Social History Narrative  . None   Family History: Family History  Problem Relation Age of Onset  . Hypertension Father   . Heart attack Maternal Grandmother   . Heart attack Maternal Grandfather     Allergies: No Known Allergies Medications: See med rec.  Review of Systems: No fevers, chills, night sweats, weight loss, chest pain, or shortness of breath.   Objective:    General: Well Developed, well nourished, and in no acute distress.  Neuro: Alert and oriented x3, extra-ocular muscles intact, sensation grossly intact.  HEENT: Normocephalic, atraumatic, pupils equal round reactive to light, neck supple, no masses, no lymphadenopathy, thyroid nonpalpable.  Skin: Warm and dry, no rashes. Cardiac: Regular rate and rhythm, no murmurs rubs or gallops, no lower extremity edema.  Respiratory: Clear to auscultation bilaterally. Not using accessory muscles, speaking in full sentences. Bilateral knees: Normal to inspection with no erythema or effusion or obvious bony abnormalities. Minimal tenderness at the posterior medial joint line ROM normal in flexion and extension and lower leg rotation. Ligaments with solid consistent endpoints including ACL, PCL, LCL, MCL. Negative Mcmurray's and provocative meniscal tests. Non painful patellar compression. Patellar and quadriceps tendons unremarkable. Hamstring and quadriceps strength is normal.  Impression and Recommendations:    Primary osteoarthritis of both knees Getting her approved again for Monovisc. Adding Pennsaid (diclofenac 2% topical). Rehab exercises given. She will return after her marathon to start the injection series. I am also going to switch her from meloxicam to Celebrex.  ___________________________________________ Gwen Her. Dianah Field, M.D., ABFM., CAQSM. Primary Care and [place] Instructor of [facility] of Medicine

## 2017-08-21 NOTE — Assessment & Plan Note (Signed)
Getting her approved again for Monovisc. Adding Pennsaid (diclofenac 2% topical). Rehab exercises given. She will return after her marathon to start the injection series. I am also going to switch her from meloxicam to Celebrex.

## 2017-08-22 ENCOUNTER — Ambulatory Visit: Payer: Self-pay | Admitting: Sports Medicine

## 2017-08-23 ENCOUNTER — Telehealth: Payer: Self-pay

## 2017-08-23 NOTE — Telephone Encounter (Signed)
Have her go up to 2 pills in the morning or 1 pill BID (max dose).

## 2017-08-23 NOTE — Telephone Encounter (Signed)
Pt left VM stating she doesn't feel like to celebrex is working as well as the meloxicam did and would like to switch back. Please advise.

## 2017-08-24 ENCOUNTER — Telehealth: Payer: Self-pay | Admitting: Sports Medicine

## 2017-08-24 NOTE — Telephone Encounter (Signed)
Barnet Pall would you see if buy and bill is accepted, we have 3 monovisc syringes here.

## 2017-08-24 NOTE — Telephone Encounter (Signed)
-----   Message from Tasia Catchings, [place] sent at 08/22/2017 11:22 AM EDT ----- Information for Monovisc has been sent to [facility]. Awaiting response.  ----- Message ----- From: Silverio Decamp, MD Sent: 08/21/2017  12:01 PM To: Tasia Catchings, CMA  Monovisc approval please, both knees.  Approved last year but we never did it. ___________________________________________ Gwen Her. Dianah Field, M.D., ABFM., CAQSM. Primary Care and [place] Instructor of [place] of [facility] of Medicine

## 2017-08-24 NOTE — Telephone Encounter (Signed)
Monovisc is covered. All services associated with the office visit, injection and product are covered under one copay of $60.00. The deductible does not apply. Insurance will cover 100% of the allowable amount, after copay. Once the out of pocket is met, the patient will have no financial responsibility. Call reference number is [id number].   Patient will need to use [place].

## 2017-08-27 NOTE — Telephone Encounter (Signed)
I am awaiting a response from the patient's insurance.

## 2017-08-28 DIAGNOSIS — M2242 Chondromalacia patellae, left knee: Secondary | ICD-10-CM | POA: Diagnosis not present

## 2017-08-28 DIAGNOSIS — M25561 Pain in right knee: Secondary | ICD-10-CM | POA: Diagnosis not present

## 2017-08-28 DIAGNOSIS — M2241 Chondromalacia patellae, right knee: Secondary | ICD-10-CM | POA: Diagnosis not present

## 2017-08-29 NOTE — Telephone Encounter (Signed)
So Autumn Patty will get [place] the monovisc syringe to give her?  If so lets move on it!

## 2017-08-29 NOTE — Telephone Encounter (Signed)
After speaking with a customer representative at [facility] we are not able to buy and bill for the [place].

## 2017-08-31 NOTE — Telephone Encounter (Signed)
Received the Monovisc Injection on 08/31/2017 and giving it to E. Nathaneil Canary for this patient. Patient will schedule after her marathon and is aware the injection is in our office.

## 2017-09-01 DIAGNOSIS — R300 Dysuria: Secondary | ICD-10-CM | POA: Diagnosis not present

## 2017-09-01 DIAGNOSIS — Z1231 Encounter for screening mammogram for malignant neoplasm of breast: Secondary | ICD-10-CM | POA: Diagnosis not present

## 2017-09-19 DIAGNOSIS — M2241 Chondromalacia patellae, right knee: Secondary | ICD-10-CM | POA: Diagnosis not present

## 2017-09-19 DIAGNOSIS — M25561 Pain in right knee: Secondary | ICD-10-CM | POA: Diagnosis not present

## 2017-09-19 DIAGNOSIS — M2242 Chondromalacia patellae, left knee: Secondary | ICD-10-CM | POA: Diagnosis not present

## 2017-10-19 ENCOUNTER — Telehealth: Payer: Self-pay

## 2017-10-19 NOTE — Telephone Encounter (Signed)
Pt left VM inquiring how her injections will go if she decides to get them? Trying to work up a plan before she has to get them. Please advise.

## 2017-10-19 NOTE — Telephone Encounter (Signed)
Since we are using Monovisc it will be a single shot in the office, she may have some soreness for the next couple of days but no limitations after that.  It can take several weeks to really start working after the injection.  Does she have any specific questions about that?

## 2017-10-26 DIAGNOSIS — M2241 Chondromalacia patellae, right knee: Secondary | ICD-10-CM | POA: Diagnosis not present

## 2017-10-26 DIAGNOSIS — M2242 Chondromalacia patellae, left knee: Secondary | ICD-10-CM | POA: Diagnosis not present

## 2017-10-26 DIAGNOSIS — M25561 Pain in right knee: Secondary | ICD-10-CM | POA: Diagnosis not present

## 2017-11-05 ENCOUNTER — Ambulatory Visit: Payer: 59 | Admitting: Sports Medicine

## 2017-11-05 DIAGNOSIS — M170 Bilateral primary osteoarthritis of knee: Secondary | ICD-10-CM | POA: Diagnosis not present

## 2017-11-05 DIAGNOSIS — M722 Plantar fascial fibromatosis: Secondary | ICD-10-CM | POA: Diagnosis not present

## 2017-11-05 NOTE — Assessment & Plan Note (Signed)
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 We will start conservatively, rehab exercises, nighttime splinting. If no better in a month we can do a plantar fascia injection.

## 2017-11-05 NOTE — Progress Notes (Signed)
Subjective:    I'm seeing this patient as a consultation for: Dr. Dimas Chyle  CC: Left foot pain  HPI: This is a pleasant 47 year old female multi marathon runner, for the past several weeks she said increasing pain on the plantar aspect of her left foot, worse with the first few steps in the morning.  Does not walk barefoot around the house, she is also noted some nodules further down over her plantar foot in the plantar fascia itself that are nontender.  Symptoms are moderate, persistent.  Localized without radiation.  In the she has bilateral knee osteoarthritis, she has been approved for Monovisc but has been somewhat hesitant to schedule the injection procedure itself.  I reviewed the past medical history, family history, social history, surgical history, and allergies today and no changes were needed.  Please see the problem list section below in epic for further details.  Past Medical History: Past Medical History:  Diagnosis Date  . Anxiety   . History of recurrent UTIs    Past Surgical History: Past Surgical History:  Procedure Laterality Date  . ENDOMETRIAL ABLATION  10/16/2013  . SEPTOPLASTY     Social History: Social History   Socioeconomic History  . Marital status: Married    Spouse name: Not on file  . Number of children: 2  . Years of education: Not on file  . Highest education level: Not on file  Occupational History  . Occupation: Passenger transport manager  Social Needs  . Financial resource strain: Not on file  . Food insecurity:    Worry: Not on file    Inability: Not on file  . Transportation needs:    Medical: Not on file    Non-medical: Not on file  Tobacco Use  . Smoking status: Never Smoker  . Smokeless tobacco: Never Used  Substance and Sexual Activity  . Alcohol use: No    Comment: Social  beer/wine  . Drug use: No  . Sexual activity: Yes    Partners: Male    Birth control/protection: None  Lifestyle  . Physical activity:    Days per  week: Not on file    Minutes per session: Not on file  . Stress: Not on file  Relationships  . Social connections:    Talks on phone: Not on file    Gets together: Not on file    Attends religious service: Not on file    Active member of club or organization: Not on file    Attends meetings of clubs or organizations: Not on file    Relationship status: Not on file  Other Topics Concern  . Not on file  Social History Narrative  . Not on file   Family History: Family History  Problem Relation Age of Onset  . Hypertension Father   . Heart attack Maternal Grandmother   . Heart attack Maternal Grandfather    Allergies: No Known Allergies Medications: See med rec.  Review of Systems: No headache, visual changes, nausea, vomiting, diarrhea, constipation, dizziness, abdominal pain, skin rash, fevers, chills, night sweats, weight loss, swollen lymph nodes, body aches, joint swelling, muscle aches, chest pain, shortness of breath, mood changes, visual or auditory hallucinations.   Objective:   General: Well Developed, well nourished, and in no acute distress.  Neuro:  Extra-ocular muscles intact, able to move all 4 extremities, sensation grossly intact.  Deep tendon reflexes tested were normal. Psych: Alert and oriented, mood congruent with affect. ENT:  Ears and nose appear unremarkable.  Hearing grossly normal. Neck: Unremarkable overall appearance, trachea midline.  No visible thyroid enlargement. Eyes: Conjunctivae and lids appear unremarkable.  Pupils equal and round. Skin: Warm and dry, no rashes noted.  Cardiovascular: Pulses palpable, no extremity edema. Left foot: No visible erythema or swelling. Range of motion is full in all directions. Strength is 5/5 in all directions. No hallux valgus. No pes cavus or pes planus. No abnormal callus noted. No pain over the navicular prominence, or base of fifth metatarsal. Tenderness at the plantar fascia origin, palpable plantar  fascial fibromatosis. No pain at the Achilles insertion. No pain over the calcaneal bursa. No pain of the retrocalcaneal bursa. No tenderness to palpation over the tarsals, metatarsals, or phalanges. No hallux rigidus or limitus. No tenderness palpation over interphalangeal joints. No pain with compression of the metatarsal heads. Neurovascularly intact distally.  Impression and Recommendations:   This case required medical decision making of moderate complexity.  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 We will start conservatively, rehab exercises, nighttime splinting. If no better in a month we can do a plantar fascia injection.  Primary osteoarthritis of both knees She is approved for Monovisc. Doing well with Celebrex. Not yet ready to do the shots, she does have pain in the middle and end of her long run. She wants to do some more research on Monovisc and can return to do both injections.  ___________________________________________ Gwen Her. Dianah Field, M.D., ABFM., CAQSM. Primary Care and [place] Instructor of [place] of [facility] of Medicine

## 2017-11-05 NOTE — Assessment & Plan Note (Signed)
She is approved for Monovisc. Doing well with Celebrex. Not yet ready to do the shots, she does have pain in the middle and end of her long run. She wants to do some more research on Monovisc and can return to do both injections.

## 2017-11-05 NOTE — Telephone Encounter (Signed)
Discussed during office visit.

## 2017-12-05 ENCOUNTER — Ambulatory Visit: Payer: Self-pay | Admitting: Sports Medicine

## 2017-12-13 ENCOUNTER — Ambulatory Visit: Payer: Self-pay | Admitting: Sports Medicine

## 2017-12-25 ENCOUNTER — Ambulatory Visit: Payer: Self-pay | Admitting: Sports Medicine

## 2018-02-01 DIAGNOSIS — Z1231 Encounter for screening mammogram for malignant neoplasm of breast: Secondary | ICD-10-CM | POA: Diagnosis not present

## 2018-03-30 DIAGNOSIS — M25562 Pain in left knee: Secondary | ICD-10-CM | POA: Diagnosis not present

## 2018-03-30 DIAGNOSIS — M25561 Pain in right knee: Secondary | ICD-10-CM | POA: Diagnosis not present

## 2018-04-03 ENCOUNTER — Ambulatory Visit: Payer: 59 | Admitting: Sports Medicine

## 2018-04-03 ENCOUNTER — Encounter: Payer: Self-pay | Admitting: Sports Medicine

## 2018-04-03 DIAGNOSIS — S8002XA Contusion of left knee, initial encounter: Secondary | ICD-10-CM | POA: Insufficient documentation

## 2018-04-03 NOTE — Progress Notes (Signed)
Subjective:    I'm seeing this patient as a consultation for: Dr. Dimas Chyle  CC: Left knee pain  HPI: This is a pleasant 47 year old female marathon runner, 6 days ago she was running, tripped and fell on her knees and shoulder, the hardest impact was on the anterior aspect of her left patella.  She was able to walk afterwards but had significant abrasions, bruising and swelling.  She was seen in orthopedic urgent care, x-rays were done that were read as negative, and she is here for further evaluation and definitive treatment.  She was prescribed oral and topical diclofenac, she is improving but tells me her left knee does not feel right.  No mechanical symptoms, no swelling.  I reviewed the past medical history, family history, social history, surgical history, and allergies today and no changes were needed.  Please see the problem list section below in epic for further details.  Past Medical History: Past Medical History:  Diagnosis Date  . Anxiety   . History of recurrent UTIs    Past Surgical History: Past Surgical History:  Procedure Laterality Date  . ENDOMETRIAL ABLATION  10/16/2013  . SEPTOPLASTY     Social History: Social History   Socioeconomic History  . Marital status: Married    Spouse name: Not on file  . Number of children: 2  . Years of education: Not on file  . Highest education level: Not on file  Occupational History  . Occupation: Passenger transport manager  Social Needs  . Financial resource strain: Not on file  . Food insecurity:    Worry: Not on file    Inability: Not on file  . Transportation needs:    Medical: Not on file    Non-medical: Not on file  Tobacco Use  . Smoking status: Never Smoker  . Smokeless tobacco: Never Used  Substance and Sexual Activity  . Alcohol use: No    Comment: Social  beer/wine  . Drug use: No  . Sexual activity: Yes    Partners: Male    Birth control/protection: None  Lifestyle  . Physical activity:    Days per  week: Not on file    Minutes per session: Not on file  . Stress: Not on file  Relationships  . Social connections:    Talks on phone: Not on file    Gets together: Not on file    Attends religious service: Not on file    Active member of club or organization: Not on file    Attends meetings of clubs or organizations: Not on file    Relationship status: Not on file  Other Topics Concern  . Not on file  Social History Narrative  . Not on file   Family History: Family History  Problem Relation Age of Onset  . Hypertension Father   . Heart attack Maternal Grandmother   . Heart attack Maternal Grandfather    Allergies: No Known Allergies Medications: See med rec.  Review of Systems: No headache, visual changes, nausea, vomiting, diarrhea, constipation, dizziness, abdominal pain, skin rash, fevers, chills, night sweats, weight loss, swollen lymph nodes, body aches, joint swelling, muscle aches, chest pain, shortness of breath, mood changes, visual or auditory hallucinations.   Objective:   General: Well Developed, well nourished, and in no acute distress.  Neuro:  Extra-ocular muscles intact, able to move all 4 extremities, sensation grossly intact.  Deep tendon reflexes tested were normal. Psych: Alert and oriented, mood congruent with affect. ENT:  Ears  and nose appear unremarkable.  Hearing grossly normal. Neck: Unremarkable overall appearance, trachea midline.  No visible thyroid enlargement. Eyes: Conjunctivae and lids appear unremarkable.  Pupils equal and round. Skin: Warm and dry, no rashes noted.  Cardiovascular: Pulses palpable, no extremity edema. Left knee: Mild abrasions without signs of bacterial infection, bruising, she is tender to palpation directly over the anterior aspect of the patella, good strength to flexion and extension. ROM normal in flexion and extension and lower leg rotation. Ligaments with solid consistent endpoints including ACL, PCL, LCL,  MCL. Negative Mcmurray's and provocative meniscal tests. Non painful patellar compression. Patellar and quadriceps tendons unremarkable. Hamstring and quadriceps strength is normal.  Impression and Recommendations:   This case required medical decision making of moderate complexity.  Contusion of left patella Fell while running 6 days ago. I only think she has a left patellar contusion. No mechanical symptoms, no effusion. Overall getting better with diclofenac oral and topical. Return as needed. If persistent discomfort in a week we will get an MRI. ___________________________________________ Gwen Her. Dianah Field, M.D., ABFM., CAQSM. Primary Care and Sports Medicine [facility]  Adjunct Professor of [place] of [facility] of Medicine

## 2018-04-03 NOTE — Assessment & Plan Note (Signed)
Fell while running 6 days ago. I only think she has a left patellar contusion. No mechanical symptoms, no effusion. Overall getting better with diclofenac oral and topical. Return as needed. If persistent discomfort in a week we will get an MRI.

## 2018-04-10 ENCOUNTER — Telehealth: Payer: Self-pay | Admitting: *Deleted

## 2018-04-10 DIAGNOSIS — S8002XD Contusion of left knee, subsequent encounter: Secondary | ICD-10-CM

## 2018-04-10 NOTE — Telephone Encounter (Signed)
Pt left vm wanting to go ahead with the MRI of her knee.  She wants to do it at [facility].

## 2018-04-11 NOTE — Telephone Encounter (Signed)
Called and notified patient. Patient states she is fine with MRI. Thanks!

## 2018-04-11 NOTE — Telephone Encounter (Signed)
MRI ordered.  We will probably have difficulty getting this approved so she may need to just pay out of pocket.

## 2018-04-15 ENCOUNTER — Telehealth: Payer: Self-pay | Admitting: Sports Medicine

## 2018-04-15 MED ORDER — DOXYCYCLINE HYCLATE 100 MG PO TABS: 100.0000 mg | ORAL_TABLET | Freq: Two times a day (BID) | ORAL | 0 refills | Status: AC

## 2018-04-15 NOTE — Telephone Encounter (Signed)
Pt called clinic inquiring about her MRI, case was submitted last week and is pending approval. Case: [id number]. Pt is now concerned there her knee is infected. She reports it is red, swollen, and warm to touch. She has not taken her temp at home stating "I always run cool."  Pt is frustrated she is not healed yet, and wants to know why this is taking so long. She would like to see if Provider would call insurance and do peer to peer. Case has not been denied yet, still just pending. If Provider would like to call, phone is: [phone].  Routing.

## 2018-04-15 NOTE — Telephone Encounter (Signed)
Pt advised. She will get Rx.

## 2018-04-15 NOTE — Telephone Encounter (Signed)
I am going to add a course of doxycycline, but we will continue to hold off on any further intervention regarding the MRI until review is no longer pending.  If there is an infection it is likely simply a superficial cellulitis considering she just had a small scratch on her knee when I saw her.Patty [name]

## 2018-04-18 ENCOUNTER — Encounter: Payer: Self-pay | Admitting: Sports Medicine

## 2018-04-18 ENCOUNTER — Ambulatory Visit (INDEPENDENT_AMBULATORY_CARE_PROVIDER_SITE_OTHER): Payer: 59 | Admitting: Sports Medicine

## 2018-04-18 DIAGNOSIS — M8580 Other specified disorders of bone density and structure, unspecified site: Secondary | ICD-10-CM | POA: Diagnosis not present

## 2018-04-18 DIAGNOSIS — S8002XD Contusion of left knee, subsequent encounter: Secondary | ICD-10-CM

## 2018-04-18 NOTE — Progress Notes (Signed)
Subjective:    CC: Follow-up  HPI: This is a pleasant 47 year old female runner, approximately 2 weeks ago she had a fall directly on her left kneecap, she had a bit of bruising and a abrasion.  Was able to bear weight, she was somewhat afraid, and desired an MRI which was not surprisingly denied.  We had a doxycycline as the abrasion became a little bit erythematous, she returns today for the most part pain-free but somewhat hesitant to start running again.  I reviewed the past medical history, family history, social history, surgical history, and allergies today and no changes were needed.  Please see the problem list section below in epic for further details.  Past Medical History: Past Medical History:  Diagnosis Date  . Anxiety   . History of recurrent UTIs    Past Surgical History: Past Surgical History:  Procedure Laterality Date  . ENDOMETRIAL ABLATION  10/16/2013  . SEPTOPLASTY     Social History: Social History   Socioeconomic History  . Marital status: Married    Spouse name: Not on file  . Number of children: 2  . Years of education: Not on file  . Highest education level: Not on file  Occupational History  . Occupation: Passenger transport manager  Social Needs  . Financial resource strain: Not on file  . Food insecurity:    Worry: Not on file    Inability: Not on file  . Transportation needs:    Medical: Not on file    Non-medical: Not on file  Tobacco Use  . Smoking status: Never Smoker  . Smokeless tobacco: Never Used  Substance and Sexual Activity  . Alcohol use: No    Comment: Social  beer/wine  . Drug use: No  . Sexual activity: Yes    Partners: Male    Birth control/protection: None  Lifestyle  . Physical activity:    Days per week: Not on file    Minutes per session: Not on file  . Stress: Not on file  Relationships  . Social connections:    Talks on phone: Not on file    Gets together: Not on file    Attends religious service: Not on file   Active member of club or organization: Not on file    Attends meetings of clubs or organizations: Not on file    Relationship status: Not on file  Other Topics Concern  . Not on file  Social History Narrative  . Not on file   Family History: Family History  Problem Relation Age of Onset  . Hypertension Father   . Heart attack Maternal Grandmother   . Heart attack Maternal Grandfather    Allergies: No Known Allergies Medications: See med rec.  Review of Systems: No fevers, chills, night sweats, weight loss, chest pain, or shortness of breath.   Objective:    General: Well Developed, well nourished, and in no acute distress.  Neuro: Alert and oriented x3, extra-ocular muscles intact, sensation grossly intact.  HEENT: Normocephalic, atraumatic, pupils equal round reactive to light, neck supple, no masses, no lymphadenopathy, thyroid nonpalpable.  Skin: Warm and dry, no rashes. Cardiac: Regular rate and rhythm, no murmurs rubs or gallops, no lower extremity edema.  Respiratory: Clear to auscultation bilaterally. Not using accessory muscles, speaking in full sentences. Left knee: Normal to inspection with no erythema or effusion or obvious bony abnormalities. Palpation normal with no warmth or joint line tenderness or patellar tenderness or condyle tenderness. ROM normal in flexion and extension  and lower leg rotation. Ligaments with solid consistent endpoints including ACL, PCL, LCL, MCL. Negative Mcmurray's and provocative meniscal tests. Non painful patellar compression. Patellar and quadriceps tendons unremarkable. Hamstring and quadriceps strength is normal. Patient able to jump up and down on the affected extremity without pain.  Impression and Recommendations:    Contusion of left patella No MRI needed. I think she had a mild traumatic prepatellar bursitis that has resolved, she had some erythema and an abrasion also resolved with doxycycline. Return as needed, she can  return to running with an up taper, 10% increase per week.   Osteopenia Last DEXA was in 2017, ordering a new one, she has been doing calcium and vitamin D twice a day. ___________________________________________ Gwen Her. Dianah Field, M.D., ABFM., CAQSM. Primary Care and Sports Medicine [facility]  Adjunct Professor of [place] of [facility] of Medicine

## 2018-04-18 NOTE — Assessment & Plan Note (Signed)
No MRI needed. I think she had a mild traumatic prepatellar bursitis that has resolved, she had some erythema and an abrasion also resolved with doxycycline. Return as needed, she can return to running with an up taper, 10% increase per week.

## 2018-04-18 NOTE — Assessment & Plan Note (Signed)
Last DEXA was in 2017, ordering a new one, she has been doing calcium and vitamin D twice a day.

## 2018-04-30 ENCOUNTER — Encounter: Payer: 59 | Admitting: Family Medicine

## 2018-05-08 ENCOUNTER — Ambulatory Visit (INDEPENDENT_AMBULATORY_CARE_PROVIDER_SITE_OTHER): Payer: 59

## 2018-05-08 DIAGNOSIS — M85852 Other specified disorders of bone density and structure, left thigh: Secondary | ICD-10-CM | POA: Diagnosis not present

## 2018-05-23 DIAGNOSIS — Z01411 Encounter for gynecological examination (general) (routine) with abnormal findings: Secondary | ICD-10-CM | POA: Diagnosis not present

## 2018-05-23 DIAGNOSIS — R300 Dysuria: Secondary | ICD-10-CM | POA: Diagnosis not present

## 2018-05-23 DIAGNOSIS — Z681 Body mass index (BMI) 19 or less, adult: Secondary | ICD-10-CM | POA: Diagnosis not present

## 2018-05-23 DIAGNOSIS — Z01419 Encounter for gynecological examination (general) (routine) without abnormal findings: Secondary | ICD-10-CM | POA: Diagnosis not present

## 2018-06-24 DIAGNOSIS — S76011A Strain of muscle, fascia and tendon of right hip, initial encounter: Secondary | ICD-10-CM | POA: Diagnosis not present

## 2018-06-24 DIAGNOSIS — M25651 Stiffness of right hip, not elsewhere classified: Secondary | ICD-10-CM | POA: Diagnosis not present

## 2018-06-24 DIAGNOSIS — M25551 Pain in right hip: Secondary | ICD-10-CM | POA: Diagnosis not present

## 2018-07-09 DIAGNOSIS — M25551 Pain in right hip: Secondary | ICD-10-CM | POA: Diagnosis not present

## 2018-07-09 DIAGNOSIS — M25651 Stiffness of right hip, not elsewhere classified: Secondary | ICD-10-CM | POA: Diagnosis not present

## 2018-07-09 DIAGNOSIS — S76011A Strain of muscle, fascia and tendon of right hip, initial encounter: Secondary | ICD-10-CM | POA: Diagnosis not present

## 2018-07-11 DIAGNOSIS — R509 Fever, unspecified: Secondary | ICD-10-CM | POA: Diagnosis not present

## 2018-07-11 DIAGNOSIS — R6889 Other general symptoms and signs: Secondary | ICD-10-CM | POA: Diagnosis not present

## 2018-07-16 ENCOUNTER — Encounter: Payer: Self-pay | Admitting: Sports Medicine

## 2018-07-16 ENCOUNTER — Ambulatory Visit (INDEPENDENT_AMBULATORY_CARE_PROVIDER_SITE_OTHER): Payer: 59

## 2018-07-16 ENCOUNTER — Other Ambulatory Visit: Payer: Self-pay | Admitting: Sports Medicine

## 2018-07-16 ENCOUNTER — Ambulatory Visit: Payer: 59 | Admitting: Sports Medicine

## 2018-07-16 DIAGNOSIS — M170 Bilateral primary osteoarthritis of knee: Secondary | ICD-10-CM | POA: Diagnosis not present

## 2018-07-16 DIAGNOSIS — M25562 Pain in left knee: Secondary | ICD-10-CM

## 2018-07-16 DIAGNOSIS — G8929 Other chronic pain: Secondary | ICD-10-CM

## 2018-07-16 DIAGNOSIS — M2242 Chondromalacia patellae, left knee: Secondary | ICD-10-CM | POA: Diagnosis not present

## 2018-07-16 DIAGNOSIS — M25561 Pain in right knee: Secondary | ICD-10-CM | POA: Diagnosis not present

## 2018-07-16 DIAGNOSIS — M1711 Unilateral primary osteoarthritis, right knee: Secondary | ICD-10-CM | POA: Diagnosis not present

## 2018-07-16 DIAGNOSIS — M1712 Unilateral primary osteoarthritis, left knee: Secondary | ICD-10-CM | POA: Diagnosis not present

## 2018-07-16 NOTE — Progress Notes (Signed)
Subjective:    CC: Left knee pain  HPI: This is a pleasant 48 year old female, she is a marathon runner, has a marathon coming up in about 3 weeks in [place].  Unfortunately she started to have increasing pain in her left knee, anterior, occasional popping and catching and other mechanical symptoms.  I reviewed the past medical history, family history, social history, surgical history, and allergies today and no changes were needed.  Please see the problem list section below in epic for further details.  Past Medical History: Past Medical History:  Diagnosis Date  . Anxiety   . History of recurrent UTIs    Past Surgical History: Past Surgical History:  Procedure Laterality Date  . ENDOMETRIAL ABLATION  10/16/2013  . SEPTOPLASTY     Social History: Social History   Socioeconomic History  . Marital status: Married    Spouse name: Not on file  . Number of children: 2  . Years of education: Not on file  . Highest education level: Not on file  Occupational History  . Occupation: Passenger transport manager  Social Needs  . Financial resource strain: Not on file  . Food insecurity:    Worry: Not on file    Inability: Not on file  . Transportation needs:    Medical: Not on file    Non-medical: Not on file  Tobacco Use  . Smoking status: Never Smoker  . Smokeless tobacco: Never Used  Substance and Sexual Activity  . Alcohol use: No    Comment: Social  beer/wine  . Drug use: No  . Sexual activity: Yes    Partners: Male    Birth control/protection: None  Lifestyle  . Physical activity:    Days per week: Not on file    Minutes per session: Not on file  . Stress: Not on file  Relationships  . Social connections:    Talks on phone: Not on file    Gets together: Not on file    Attends religious service: Not on file    Active member of club or organization: Not on file    Attends meetings of clubs or organizations: Not on file    Relationship status: Not on file  Other Topics  Concern  . Not on file  Social History Narrative  . Not on file   Family History: Family History  Problem Relation Age of Onset  . Hypertension Father   . Heart attack Maternal Grandmother   . Heart attack Maternal Grandfather    Allergies: No Known Allergies Medications: See med rec.  Review of Systems: No fevers, chills, night sweats, weight loss, chest pain, or shortness of breath.   Objective:    General: Well Developed, well nourished, and in no acute distress.  Neuro: Alert and oriented x3, extra-ocular muscles intact, sensation grossly intact.  HEENT: Normocephalic, atraumatic, pupils equal round reactive to light, neck supple, no masses, no lymphadenopathy, thyroid nonpalpable.  Skin: Warm and dry, no rashes. Cardiac: Regular rate and rhythm, no murmurs rubs or gallops, no lower extremity edema.  Respiratory: Clear to auscultation bilaterally. Not using accessory muscles, speaking in full sentences. Left knee: Mild effusion Tender to palpation of the patellar facets ROM normal in flexion and extension and lower leg rotation. Ligaments with solid consistent endpoints including ACL, PCL, LCL, MCL. Negative Mcmurray's and provocative meniscal tests. Non painful patellar compression. Patellar and quadriceps tendons unremarkable. Hamstring and quadriceps strength is normal.  Procedure: Real-time Ultrasound Guided Isovue contrast injection of left knee Device:  GE Logiq E  Verbal informed consent obtained.  Time-out conducted.  Noted no overlying erythema, induration, or other signs of local infection.  Skin prepped in a sterile fashion.  Local anesthesia: Topical Ethyl chloride.  With sterile technique and under real time ultrasound guidance: 22-gauge needle advanced into the suprapatellar recess, I injected 10 cc sterile saline, then 2 cc lidocaine, 2 cc bupivacaine, then 20 cc Isovue, then 10 more cc sterile saline. The suprapatellar recess was compressed with an Ace  bandage to force contrast into the joint. Joint visualized and capsule seen distending confirming intra-articular placement of contrast material and medication. Completed without difficulty  Advised to call if fevers/chills, erythema, induration, drainage, or persistent bleeding.  Images permanently stored and available for review in the ultrasound unit.  Impression: Technically successful ultrasound guided gadolinium contrast injection for MR arthrography.  Please see separate MR arthrogram report.   Impression and Recommendations:    Primary osteoarthritis of both knees We still have not used her Monovisc. She is having some mechanical symptoms and pain in the left knee. This is been going on for over 6 weeks. She does have a marathon coming up in [place] in about 3 weeks. Because of her mechanical symptoms, and failure of conservative measures we are going to proceed with x-ray and a CT with iodinated contrast intra-articular into the left knee.  ___________________________________________ Gwen Her. Dianah Field, M.D., ABFM., CAQSM. Primary Care and Sports Medicine [facility]  Adjunct Professor of [place] of [facility] of Medicine

## 2018-07-16 NOTE — Assessment & Plan Note (Addendum)
We still have not used her Monovisc. She is having some mechanical symptoms and pain in the left knee. This is been going on for over 6 weeks. She does have a marathon coming up in [place] in about 3 weeks. Because of her mechanical symptoms, and failure of conservative measures we are going to proceed with x-ray and a CT with iodinated contrast intra-articular into the left knee.

## 2018-07-23 DIAGNOSIS — S76011A Strain of muscle, fascia and tendon of right hip, initial encounter: Secondary | ICD-10-CM | POA: Diagnosis not present

## 2018-07-23 DIAGNOSIS — M25551 Pain in right hip: Secondary | ICD-10-CM | POA: Diagnosis not present

## 2018-07-23 DIAGNOSIS — M25651 Stiffness of right hip, not elsewhere classified: Secondary | ICD-10-CM | POA: Diagnosis not present

## 2018-07-30 DIAGNOSIS — S76011A Strain of muscle, fascia and tendon of right hip, initial encounter: Secondary | ICD-10-CM | POA: Diagnosis not present

## 2018-07-30 DIAGNOSIS — M25551 Pain in right hip: Secondary | ICD-10-CM | POA: Diagnosis not present

## 2018-07-30 DIAGNOSIS — M25651 Stiffness of right hip, not elsewhere classified: Secondary | ICD-10-CM | POA: Diagnosis not present

## 2018-08-13 ENCOUNTER — Other Ambulatory Visit: Payer: Self-pay | Admitting: Sports Medicine

## 2018-08-13 DIAGNOSIS — M170 Bilateral primary osteoarthritis of knee: Secondary | ICD-10-CM

## 2018-08-15 DIAGNOSIS — M25651 Stiffness of right hip, not elsewhere classified: Secondary | ICD-10-CM | POA: Diagnosis not present

## 2018-08-15 DIAGNOSIS — S76011A Strain of muscle, fascia and tendon of right hip, initial encounter: Secondary | ICD-10-CM | POA: Diagnosis not present

## 2018-08-15 DIAGNOSIS — M25551 Pain in right hip: Secondary | ICD-10-CM | POA: Diagnosis not present

## 2019-01-08 ENCOUNTER — Telehealth: Payer: Self-pay | Admitting: Family Medicine

## 2019-01-08 ENCOUNTER — Encounter: Payer: Self-pay | Admitting: Sports Medicine

## 2019-01-08 NOTE — Telephone Encounter (Signed)
Pt called. She has an appointment tomorrow for knee injection. She wants to know if you told her that after the injection she would have to rest 3 days and no running?(she wants you to  respond to her via email).  Thanks

## 2019-01-08 NOTE — Telephone Encounter (Signed)
Done

## 2019-01-09 ENCOUNTER — Other Ambulatory Visit: Payer: Self-pay

## 2019-01-09 ENCOUNTER — Ambulatory Visit: Payer: 59 | Admitting: Sports Medicine

## 2019-01-09 ENCOUNTER — Encounter: Payer: Self-pay | Admitting: Sports Medicine

## 2019-01-09 DIAGNOSIS — M170 Bilateral primary osteoarthritis of knee: Secondary | ICD-10-CM | POA: Diagnosis not present

## 2019-01-09 NOTE — Progress Notes (Signed)
   Procedure: Real-time Ultrasound Guided injection of the left knee Device: GE Logiq E  Verbal informed consent obtained.  Time-out conducted.  Noted no overlying erythema, induration, or other signs of local infection.  Skin prepped in a sterile fashion.  Local anesthesia: Topical Ethyl chloride.  With sterile technique and under real time ultrasound guidance:  88 mg/4 mL of MonoVisc (sodium hyaluronate) in a prefilled syringe was injected easily into the knee through a 22-gauge needle. Completed without difficulty  Pain immediately resolved suggesting accurate placement of the medication.  Advised to call if fevers/chills, erythema, induration, drainage, or persistent bleeding.  Images permanently stored and available for review in the ultrasound unit.  Impression: Technically successful ultrasound guided injection.

## 2019-01-09 NOTE — Assessment & Plan Note (Signed)
Monovisc into the left knee, 1 month Doximity visit to reevaluate. She will take it easy, activities of daily living only for the next 4 to 5 days. Then she can get back to running.

## 2019-01-09 NOTE — Telephone Encounter (Signed)
Thanks

## 2019-02-06 ENCOUNTER — Ambulatory Visit (INDEPENDENT_AMBULATORY_CARE_PROVIDER_SITE_OTHER): Payer: 59 | Admitting: Sports Medicine

## 2019-02-06 ENCOUNTER — Encounter: Payer: Self-pay | Admitting: Sports Medicine

## 2019-02-06 DIAGNOSIS — M170 Bilateral primary osteoarthritis of knee: Secondary | ICD-10-CM | POA: Diagnosis not present

## 2019-02-06 NOTE — Assessment & Plan Note (Signed)
Left knee is now pain-free after Monovisc a month ago. She did ask if Monovisc would be useful for her right knee but she has no pain, I have advised against any invasive procedure unless she has discomfort. Return to see me as needed, she knows to call and let us know to order a Monovisc if she needs this again.

## 2019-02-06 NOTE — Progress Notes (Signed)
Virtual Visit via WebEx/MyChart   I connected with  Patty [name]  on 02/06/19 via WebEx/MyChart/Doximity Video and verified that I am speaking with the correct person using two identifiers.   I discussed the limitations, risks, security and privacy concerns of performing an evaluation and management service by WebEx/MyChart/Doximity Video, including the higher likelihood of inaccurate diagnosis and treatment, and the availability of in person appointments.  We also discussed the likely need of an additional face to face encounter for complete and high quality delivery of care.  I also discussed with the patient that there may be a patient responsible charge related to this service. The patient expressed understanding and wishes to proceed.  Provider location is either at home or medical facility. Patient location is at their home, different from provider location. People involved in care of the patient during this telehealth encounter were myself, my nurse/medical assistant, and my front office/scheduling team member.  Subjective:    CC: Follow-up  HPI: Knee osteoarthritis: See below.  I reviewed the past medical history, family history, social history, surgical history, and allergies today and no changes were needed.  Please see the problem list section below in epic for further details.  Past Medical History: Past Medical History:  Diagnosis Date  . Anxiety   . History of recurrent UTIs    Past Surgical History: Past Surgical History:  Procedure Laterality Date  . ENDOMETRIAL ABLATION  10/16/2013  . SEPTOPLASTY     Social History: Social History   Socioeconomic History  . Marital status: Married    Spouse name: Not on file  . Number of children: 2  . Years of education: Not on file  . Highest education level: Not on file  Occupational History  . Occupation: Passenger transport manager  Social Needs  . Financial resource strain: Not on file  . Food insecurity    Worry:  Not on file    Inability: Not on file  . Transportation needs    Medical: Not on file    Non-medical: Not on file  Tobacco Use  . Smoking status: Never Smoker  . Smokeless tobacco: Never Used  Substance and Sexual Activity  . Alcohol use: No    Comment: Social  beer/wine  . Drug use: No  . Sexual activity: Yes    Partners: Male    Birth control/protection: None  Lifestyle  . Physical activity    Days per week: Not on file    Minutes per session: Not on file  . Stress: Not on file  Relationships  . Social Herbalist on phone: Not on file    Gets together: Not on file    Attends religious service: Not on file    Active member of club or organization: Not on file    Attends meetings of clubs or organizations: Not on file    Relationship status: Not on file  Other Topics Concern  . Not on file  Social History Narrative  . Not on file   Family History: Family History  Problem Relation Age of Onset  . Hypertension Father   . Heart attack Maternal Grandmother   . Heart attack Maternal Grandfather    Allergies: No Known Allergies Medications: See med rec.  Review of Systems: No fevers, chills, night sweats, weight loss, chest pain, or shortness of breath.   Objective:    General: Speaking full sentences, no audible heavy breathing.  Sounds alert and appropriately interactive.  Appears well.  Face symmetric.  Extraocular movements intact.  Pupils equal and round.  No nasal flaring or accessory muscle use visualized.  No other physical exam performed due to the non-physical nature of this visit.  Impression and Recommendations:    Primary osteoarthritis of both knees Left knee is now pain-free after Monovisc a month ago. She did ask if Monovisc would be useful for her right knee but she has no pain, I have advised against any invasive procedure unless she has discomfort. Return to see me as needed, she knows to call and let us know to order a Monovisc if she  needs this again.  I discussed the above assessment and treatment plan with the patient. The patient was provided an opportunity to ask questions and all were answered. The patient agreed with the plan and demonstrated an understanding of the instructions.   The patient was advised to call back or seek an in-person evaluation if the symptoms worsen or if the condition fails to improve as anticipated.   I provided 25 minutes of non-face-to-face time during this encounter, 15 minutes of additional time was needed to gather information, review chart, records, communicate/coordinate with staff remotely, troubleshooting the multiple errors that we get every time when trying to do video calls through the electronic medical record, WebEx, and Doximity, restart the encounter multiple times due to instability of the software, as well as complete documentation.   ___________________________________________ Gwen Her. Dianah Field, M.D., ABFM., CAQSM. Primary Care and Sports Medicine [place] [facility]  Adjunct Professor of [place] of [facility] of Medicine

## 2019-02-20 ENCOUNTER — Telehealth: Payer: Self-pay | Admitting: *Deleted

## 2019-02-20 NOTE — Telephone Encounter (Signed)
Patty [name] left vm this afternoon stating that her 4th toe on her left foot has started going numb when she's running.  She said that it will last for like 2-3 min at a time but it's occurring more frequently now.  She wanted to know if you could do virtual visit for that or if you'd need her to come in.  Please advise.

## 2019-02-20 NOTE — Telephone Encounter (Signed)
This is all too common in runners, usually we have to just add a small pad in the shoe/orthotic for a brief time.  Have her come in and bring running shoes/orthotics.

## 2019-02-21 NOTE — Telephone Encounter (Signed)
Pt notified of provider recommendations and will call sometime next week for an appointment.

## 2019-03-10 ENCOUNTER — Other Ambulatory Visit: Payer: Self-pay

## 2019-03-10 ENCOUNTER — Ambulatory Visit: Payer: 59 | Admitting: Sports Medicine

## 2019-03-10 ENCOUNTER — Encounter: Payer: Self-pay | Admitting: Sports Medicine

## 2019-03-10 ENCOUNTER — Ambulatory Visit (INDEPENDENT_AMBULATORY_CARE_PROVIDER_SITE_OTHER): Payer: 59

## 2019-03-10 DIAGNOSIS — M79672 Pain in left foot: Secondary | ICD-10-CM

## 2019-03-10 NOTE — Assessment & Plan Note (Addendum)
X-rays, pain at the third metatarsal shaft. I would like a nonurgent MRI, I do think she has a stress fracture. Ibuprofen before runs. She will cut back her mileage over the next week. She also has some numbness in her fourth toe, this is very normal with runners, she has a unremarkable exam. Her orthotics are also too thick, she will go to [facility] and look into a very thin cushion.

## 2019-03-10 NOTE — Progress Notes (Signed)
Subjective:    CC: Left foot pain, numbness and tingling  HPI: Patty [name] is a pleasant 48 year old female marathon runner, she is currently up to 13 miles per run.  Unfortunately she has developed numbness in her fourth toe, as well as pain over her midfoot.  Symptoms are moderate, persistent, localized without radiation.  I reviewed the past medical history, family history, social history, surgical history, and allergies today and no changes were needed.  Please see the problem list section below in epic for further details.  Past Medical History: Past Medical History:  Diagnosis Date  . Anxiety   . History of recurrent UTIs    Past Surgical History: Past Surgical History:  Procedure Laterality Date  . ENDOMETRIAL ABLATION  10/16/2013  . SEPTOPLASTY     Social History: Social History   Socioeconomic History  . Marital status: Married    Spouse name: Not on file  . Number of children: 2  . Years of education: Not on file  . Highest education level: Not on file  Occupational History  . Occupation: Passenger transport manager  Social Needs  . Financial resource strain: Not on file  . Food insecurity    Worry: Not on file    Inability: Not on file  . Transportation needs    Medical: Not on file    Non-medical: Not on file  Tobacco Use  . Smoking status: Never Smoker  . Smokeless tobacco: Never Used  Substance and Sexual Activity  . Alcohol use: No    Comment: Social  beer/wine  . Drug use: No  . Sexual activity: Yes    Partners: Male    Birth control/protection: None  Lifestyle  . Physical activity    Days per week: Not on file    Minutes per session: Not on file  . Stress: Not on file  Relationships  . Social Herbalist on phone: Not on file    Gets together: Not on file    Attends religious service: Not on file    Active member of club or organization: Not on file    Attends meetings of clubs or organizations: Not on file    Relationship status: Not on file   Other Topics Concern  . Not on file  Social History Narrative  . Not on file   Family History: Family History  Problem Relation Age of Onset  . Hypertension Father   . Heart attack Maternal Grandmother   . Heart attack Maternal Grandfather    Allergies: No Known Allergies Medications: See med rec.  Review of Systems: No fevers, chills, night sweats, weight loss, chest pain, or shortness of breath.   Objective:    General: Well Developed, well nourished, and in no acute distress.  Neuro: Alert and oriented x3, extra-ocular muscles intact, sensation grossly intact.  HEENT: Normocephalic, atraumatic, pupils equal round reactive to light, neck supple, no masses, no lymphadenopathy, thyroid nonpalpable.  Skin: Warm and dry, no rashes. Cardiac: Regular rate and rhythm, no murmurs rubs or gallops, no lower extremity edema.  Respiratory: Clear to auscultation bilaterally. Not using accessory muscles, speaking in full sentences. Left foot: No visible erythema or swelling. Range of motion is full in all directions. Strength is 5/5 in all directions. No hallux valgus. No pes cavus or pes planus. No abnormal callus noted. No pain over the navicular prominence, or base of fifth metatarsal. No tenderness to palpation of the calcaneal insertion of plantar fascia. No pain at the Achilles insertion.  No pain over the calcaneal bursa. No pain of the retrocalcaneal bursa. Tender palpation over the third metatarsal shaft No hallux rigidus or limitus. No tenderness palpation over interphalangeal joints. No pain with compression of the metatarsal heads. Neurovascularly intact distally.  Impression and Recommendations:    Acute foot pain, left X-rays, pain at the third metatarsal shaft. I would like a nonurgent MRI, I do think she has a stress fracture. Ibuprofen before runs. She will cut back her mileage over the next week. She also has some numbness in her fourth toe, this is very  normal with runners, she has a unremarkable exam. Her orthotics are also too thick, she will go to [facility] and look into a very thin cushion.   ___________________________________________ Gwen Her. Dianah Field, M.D., ABFM., CAQSM. Primary Care and Sports Medicine [place]  Adjunct Professor of [place] of [facility] of Medicine

## 2019-03-11 ENCOUNTER — Other Ambulatory Visit: Payer: Self-pay

## 2019-03-11 ENCOUNTER — Ambulatory Visit (INDEPENDENT_AMBULATORY_CARE_PROVIDER_SITE_OTHER): Payer: 59

## 2019-03-11 DIAGNOSIS — M79672 Pain in left foot: Secondary | ICD-10-CM

## 2019-03-25 ENCOUNTER — Ambulatory Visit: Payer: 59 | Admitting: Sports Medicine

## 2019-06-27 ENCOUNTER — Ambulatory Visit: Payer: 59 | Attending: Internal Medicine

## 2019-06-27 DIAGNOSIS — Z20822 Contact with and (suspected) exposure to covid-19: Secondary | ICD-10-CM

## 2019-06-28 LAB — NOVEL CORONAVIRUS, NAA: SARS-CoV-2, NAA: NOT DETECTED

## 2019-08-08 ENCOUNTER — Other Ambulatory Visit: Payer: Self-pay

## 2019-08-08 ENCOUNTER — Ambulatory Visit: Payer: 59 | Attending: Internal Medicine

## 2019-08-08 DIAGNOSIS — Z23 Encounter for immunization: Secondary | ICD-10-CM | POA: Insufficient documentation

## 2019-08-08 NOTE — Progress Notes (Signed)
   Covid-19 Vaccination Clinic  Name:  Patty [name]    MRN: [id number] DOB: 03-20-1971  08/08/2019  [name] was observed post Covid-19 immunization for 15 minutes without incident. She was provided with Vaccine Information Sheet and instruction to access the V-Safe system.   [name] was instructed to call 911 with any severe reactions post vaccine: Marland Kitchen Difficulty breathing  . Swelling of face and throat  . A fast heartbeat  . A bad rash all over body  . Dizziness and weakness

## 2019-09-03 ENCOUNTER — Ambulatory Visit: Payer: 59 | Attending: Internal Medicine

## 2019-09-03 ENCOUNTER — Encounter: Payer: Self-pay | Admitting: Sports Medicine

## 2019-09-03 ENCOUNTER — Other Ambulatory Visit: Payer: Self-pay

## 2019-09-03 ENCOUNTER — Ambulatory Visit: Payer: 59 | Admitting: Sports Medicine

## 2019-09-03 ENCOUNTER — Ambulatory Visit (INDEPENDENT_AMBULATORY_CARE_PROVIDER_SITE_OTHER): Payer: 59

## 2019-09-03 DIAGNOSIS — Z23 Encounter for immunization: Secondary | ICD-10-CM

## 2019-09-03 DIAGNOSIS — M542 Cervicalgia: Secondary | ICD-10-CM

## 2019-09-03 MED ORDER — CYCLOBENZAPRINE HCL 10 MG PO TABS: ORAL_TABLET | ORAL | 0 refills | Status: DC

## 2019-09-03 NOTE — Assessment & Plan Note (Signed)
This is a pleasant for-year-old female, she is a prominent runner. For the past week she is had pain in her neck, bilateral paracervical muscles with radiation into the right periscapular region, nothing overtly radicular down to the hand or fingertips, no progressive weakness, no red flags. We can treat this conservatively, cyclobenzaprine at night, x-rays, home rehab exercises. Avoiding prednisone as she is getting her COVID-19 shot coming up. Return to see me if no better in a month.

## 2019-09-03 NOTE — Progress Notes (Signed)
   Covid-19 Vaccination Clinic  Name:  Patty [name]    MRN: [id number] DOB: November 27, 1970  09/03/2019  Patty [name] was observed post Covid-19 immunization for 15 minutes without incident. She was provided with Vaccine Information Sheet and instruction to access the V-Safe system.   Patty [name] was instructed to call 911 with any severe reactions post vaccine: Marland Kitchen Difficulty breathing  . Swelling of face and throat  . A fast heartbeat  . A bad rash all over body  . Dizziness and weakness   Immunizations Administered    Name Date Dose VIS Date Route   Pfizer COVID-19 Vaccine 09/03/2019 11:31 AM 0.3 mL 05/16/2019 Intramuscular   Manufacturer: [place]   Lot: [id number]   [place]: [id number]

## 2019-09-03 NOTE — Progress Notes (Signed)
    Procedures performed today:    None.  Independent interpretation of notes and tests performed by another provider:   None.  Brief History, Exam, Impression, and Recommendations:    Acute neck pain This is a pleasant for-year-old female, she is a prominent runner. For the past week she is had pain in her neck, bilateral paracervical muscles with radiation into the right periscapular region, nothing overtly radicular down to the hand or fingertips, no progressive weakness, no red flags. We can treat this conservatively, cyclobenzaprine at night, x-rays, home rehab exercises. Avoiding prednisone as she is getting her COVID-19 shot coming up. Return to see me if no better in a month.    ___________________________________________ Gwen Her. Dianah Field, M.D., ABFM., CAQSM. Primary Care and [place] Instructor of [place] of [facility] of Medicine

## 2019-09-21 ENCOUNTER — Other Ambulatory Visit: Payer: Self-pay | Admitting: Sports Medicine

## 2019-09-21 DIAGNOSIS — M170 Bilateral primary osteoarthritis of knee: Secondary | ICD-10-CM

## 2019-11-13 ENCOUNTER — Telehealth: Payer: Self-pay | Admitting: *Deleted

## 2019-11-13 DIAGNOSIS — M170 Bilateral primary osteoarthritis of knee: Secondary | ICD-10-CM

## 2019-11-13 NOTE — Telephone Encounter (Signed)
Submitted request for [employer] authorization. - CF

## 2019-11-13 NOTE — Telephone Encounter (Signed)
Pt left vm today that she is finally ready to go through with Monovisc.  Jenny Reichmann will you please work on this?

## 2019-11-14 MED ORDER — EUFLEXXA 20 MG/2ML IX SOSY: 1.0000 | PREFILLED_SYRINGE | INTRA_ARTICULAR | 3 refills | Status: DC

## 2019-11-14 NOTE — Telephone Encounter (Signed)
Dr T   While doing PA for Monovisc they required a peer to peer review.  They have 3 preferred agents which are   Durolane Euflexxa Gelsyn-3  Her specialty pharmacy where the medication can be sent is [place] the phone number is [phone].   Thank you Jenny Reichmann

## 2019-11-14 NOTE — Telephone Encounter (Signed)
Euflexxa sent into the specialty pharmacy, quantity sufficient for both knees.

## 2019-11-14 NOTE — Telephone Encounter (Signed)
Received Benefits Investigation Detail patient must meet deductible and oop before covered at 100% and it does require prior auth. I am submitting through [facility] and waiting for determination. - CF

## 2020-01-07 ENCOUNTER — Ambulatory Visit (INDEPENDENT_AMBULATORY_CARE_PROVIDER_SITE_OTHER): Payer: 59 | Admitting: Sports Medicine

## 2020-01-07 ENCOUNTER — Other Ambulatory Visit: Payer: Self-pay

## 2020-01-07 DIAGNOSIS — M170 Bilateral primary osteoarthritis of knee: Secondary | ICD-10-CM

## 2020-01-07 NOTE — Progress Notes (Signed)
° ° °  Procedures performed today:    None.  Independent interpretation of notes and tests performed by another provider:   None.  Brief History, Exam, Impression, and Recommendations:    Primary osteoarthritis of both knees This is a very pleasant 49 year old female, she has recently finished a 39 km run. She is training for another marathon. She has known osteoarthritis, this did extremely well in her left knee after Monovisc back in August 2020. At this point she is considering repeat viscosupplementation, it would likely be Euflexxa as this is her current preferred agent with her insurance company. I also discussed PRP leukocyte poor with her. I am going to give her some literature on PRP, work on quad strengthening, hamstring strengthening, alternating gait from heel strike to forefoot running if she gets pain, and running on softer surfaces. She can come back to see me on an as-needed basis regarding the injections, if we do Euflexxa we would need to order it, if PRP she just needs a 30-minute slot.    ___________________________________________ Gwen Her. Dianah Field, M.D., ABFM., CAQSM. Primary Care and [place] Instructor of [place] of [facility] of Medicine

## 2020-01-07 NOTE — Patient Instructions (Signed)
Work on quad strengthening, hamstring strengthening, alternating gait from heel strike to forefoot running if she gets pain, and running on softer surfaces.    Platelet-rich plasma is used in musculoskeletal medicine to focus your own body's ability to heal. It has several well-done published randomized control trials (RCT) which demonstrate both its effectiveness and safety in many musculoskeletal conditions, including osteoarthritis, tendinopathies, and damaged vertebral discs. PRP has been in clinical use since the 1990's. Many people know that platelets form a clot if there is a cut in the skin. It turns out that platelets do not only form a clot, they also start the body's own repair process. When platelets activate to form a clot, they also release alpha granules which have hundreds of chemical messengers in them that initiate and organize repair to the damaged tissue. Precisely placing PRP into the site of injury will initiate the healing process by activating on the damaged cartilage or tendon. This is an inflammatory process, and inflammation is the vital first phase of Healing.  What to expect and how to prepare for PRP  . 2 weeks prior to the procedure: depending on the procedure, you may need to arrange for a driver to bring you home. IF you are having a lower extremity procedure, we can provide crutches as needed.  . 7 days prior to the procedure: Stop taking anti-inflammatory drugs like ibuprofen, Naprosyn, Celebrex, or Meloxicam. Let Dr. Dianah Field know if you have been taking prednisone or other corticosteroids in the last month.  . The day before the procedure: thoroughly shower and clean your skin.   . The day of the procedure: Wear loose-fitting clothing like sweatpants or shorts. If you are having an upper body procedure wear a top that can button or zip up.  PRP will initiate healing and a productive inflammation, and PRP therapy will make the body part treated  sore for 4 days to two weeks. Anti-inflammatory drugs (i.e. ibuprofen, Naprosyn, Celebrex) and corticosteroids such as prednisone can blunt or stop this process, so it is important to not take any anti-inflammatory drugs for 7 days before getting PRP therapy, or for at least three weeks after PRP therapy. Corticosteroid injections can blunt inflammation for 30 days, so let us know if you have had one recently. Depending on the body part injected, you may be in a sling or on crutches for several days. Just like wringing out a wet dishcloth, if you load or tense a tendon or ligament that has just been injected with PRP, some of the PRP injected will squish out. By keeping the body part treated relaxed by using a sling (for the shoulder or arm) or crutches (for hips and legs) for a few days, the PRP can bind in place and do its job.   You may need a driver to bring you home.  Tobacco/nicotine is a potent toxin and its use constricts small blood vessels which are needed for tissue repair.  Tobacco/nicotine use will limit the effectiveness of any treatment and stopping tobacco use is one of the single  greatest actions you can take to improve your health. Avoid toxins like alcohol, which inhibits and depresses the cells needed for tissue repair.  What happens during the PRP procedure?  Platelet rich plasma is made by taking some of your blood and performing a two-stage centrifuge process on it to concentrate the PRP. First, your blood is drawn into a syringe with a small amount of anti-coagulant in it (this is to keep  the blood from clotting during this process). The amount of blood drawn is usually about 10-30 milliliters, depending on how much PRP is needed for the treatment.  (There are 355 milliliters in a 12-ounce soda can for comparison).  Then the blood is transferred in a sterile fashion into a centrifuge tube. It is then centrifuged for the first cycle where the red blood cells are  isolated and discarded. In the second centrifuge cycle, the platelet-rich fraction of the remaining plasma is concentrated and placed in a syringe. The skin at the injection site is numbed with a small amount of topical cooling spray. Dr Dianah Field will then precisely inject the PRP into the injury site using ultrasound guidance.  What to do after your procedure  I will give you specific medicine to control any discomfort you may have after the procedure. Avoid NSAIDs like ibuprofen. Acetaminophen can be used for mild pain.  Depending on the part of the body treated, usually you will be placed in a sling or on crutches for 1 to 3 days. Do your best not to tense or load the treated area during this time. After 3 days, unless otherwise instructed, the treated body part should be used and slowly moved through its full range of motion. It will be sore, but you will not be doing damage by moving it, in fact it needs to move to heal. If you were on crutches for a period of time, walking is ok once you are off the crutches. For now, avoid activities that specifically hurt you before being treated. Exercise is vital to good health and finding a way to cross train around your injury is important not only for your physical health, but for your mental health as well. Ask me about cross training options for your injury. Some brief (10 minutes or less) period of heat or ice therapy will not hurt the therapy, but it is not required. Usually, depending on the initial injury, physical therapy is started from two weeks to four weeks after injection. Improvements in pain and function should be expected from 8 weeks to 12 weeks after injection and some injuries may require more than one treatment.    ___________________________________________ Patty [name]. Dianah Field, M.D., ABFM., CAQSM. Primary Care and Sports Medicine [place] [facility]  Adjunct Professor of [place] of [facility] of Medicine

## 2020-01-07 NOTE — Assessment & Plan Note (Signed)
This is a very pleasant 49 year old female, she has recently finished a 18 km run. She is training for another marathon. She has known osteoarthritis, this did extremely well in her left knee after Monovisc back in August 2020. At this point she is considering repeat viscosupplementation, it would likely be Euflexxa as this is her current preferred agent with her insurance company. I also discussed PRP leukocyte poor with her. I am going to give her some literature on PRP, work on quad strengthening, hamstring strengthening, alternating gait from heel strike to forefoot running if she gets pain, and running on softer surfaces. She can come back to see me on an as-needed basis regarding the injections, if we do Euflexxa we would need to order it, if PRP she just needs a 30-minute slot.

## 2020-02-10 ENCOUNTER — Telehealth: Payer: Self-pay | Admitting: Family Medicine

## 2020-02-10 ENCOUNTER — Encounter: Payer: Self-pay | Admitting: Family Medicine

## 2020-02-10 ENCOUNTER — Other Ambulatory Visit: Payer: Self-pay

## 2020-02-10 ENCOUNTER — Ambulatory Visit (INDEPENDENT_AMBULATORY_CARE_PROVIDER_SITE_OTHER): Payer: 59 | Admitting: Family Medicine

## 2020-02-10 VITALS — BP 113/66 | HR 52 | Temp 97.7°F | Ht 67.5 in | Wt 121.4 lb

## 2020-02-10 DIAGNOSIS — M8580 Other specified disorders of bone density and structure, unspecified site: Secondary | ICD-10-CM | POA: Diagnosis not present

## 2020-02-10 DIAGNOSIS — Z0001 Encounter for general adult medical examination with abnormal findings: Secondary | ICD-10-CM | POA: Diagnosis not present

## 2020-02-10 DIAGNOSIS — Z13220 Encounter for screening for lipoid disorders: Secondary | ICD-10-CM

## 2020-02-10 DIAGNOSIS — Z1211 Encounter for screening for malignant neoplasm of colon: Secondary | ICD-10-CM | POA: Diagnosis not present

## 2020-02-10 NOTE — Telephone Encounter (Signed)
Pt aware. Will schedule appointment for December

## 2020-02-10 NOTE — Assessment & Plan Note (Signed)
Check vitamin D.  We will also order DEXA scan.

## 2020-02-10 NOTE — Progress Notes (Signed)
Chief Complaint:  Patty [name] is a 49 y.o. female who presents today for her annual comprehensive physical exam.    Assessment/Plan:  Chronic Problems Addressed Today: Osteopenia Check vitamin D.  We will also order DEXA scan.  Preventative Healthcare: Has had mammogram and Pap smear recently.  Will place referral for colonoscopy.  Check CBC, CMET, TSH, lipid panel.  Patient Counseling(The following topics were reviewed and/or handout was given):  -Nutrition: Stressed importance of moderation in sodium/caffeine intake, saturated fat and cholesterol, caloric balance, sufficient intake of fresh fruits, vegetables, and fiber.  -Stressed the importance of regular exercise.   -Substance Abuse: Discussed cessation/primary prevention of tobacco, alcohol, or other drug use; driving or other dangerous activities under the influence; availability of treatment for abuse.   -Injury prevention: Discussed safety belts, safety helmets, smoke detector, smoking near bedding or upholstery.   -Sexuality: Discussed sexually transmitted diseases, partner selection, use of condoms, avoidance of unintended pregnancy and contraceptive alternatives.   -Dental health: Discussed importance of regular tooth brushing, flossing, and dental visits.  -Health maintenance and immunizations reviewed. Please refer to Health maintenance section.  Return to care in 1 year for next preventative visit.     Subjective:  HPI:  She has no acute complaints today.   Lifestyle Diet: Balanced.  Exercise: Runs regularly.   Depression screen PHQ 2/9 03/19/2017  Decreased Interest 0  Down, Depressed, Hopeless 0  PHQ - 2 Score 0    Health Maintenance Due  Topic Date Due  . Hepatitis C Screening  Never done  . HIV Screening  Never done  . PAP SMEAR-Modifier  01/24/2018  . MAMMOGRAM  08/22/2019  . COLONOSCOPY  Never done     ROS: Per HPI, otherwise a complete review of systems was negative.   PMH:  The  following were reviewed and entered/updated in epic: Past Medical History:  Diagnosis Date  . Anxiety   . History of recurrent UTIs    Patient Active Problem List   Diagnosis Date Noted  . Osteopenia 04/18/2018  . 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 Primary osteoarthritis of both knees 08/21/2017  . 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 Displacement of lumbar intervertebral disc without myelopathy 07/07/2014  . Compression sided left femoral neck stress fracture 06/12/2014  . Avitaminosis D 03/10/2014  . Lumbar degenerative disc disease 03/06/2014  . Leg length discrepancy 03/06/2014  . Obsessive compulsive disorder 08/26/2013  . Generalized anxiety disorder 12/24/2011   Past Surgical History:  Procedure Laterality Date  . ENDOMETRIAL ABLATION  10/16/2013  . SEPTOPLASTY      Family History  Problem Relation Age of Onset  . Hypertension Father   . Heart attack Maternal Grandmother   . Heart attack Maternal Grandfather     Medications- reviewed and updated Current Outpatient Medications  Medication Sig Dispense Refill  . celecoxib (CELEBREX) 200 MG capsule TAKE 1 TO 2 CAPSULES BY MOUTH DAILY AS NEEDED FOR PAIN 60 capsule 2  . Multiple Vitamin (MULTIVITAMIN) tablet Take 1 tablet by mouth daily.     . Sodium Hyaluronate (EUFLEXXA) 20 MG/2ML SOSY Inject 1 each into the articular space once a week. Into each knee  x3 weeks.  Dx Knee osteoarthritis 12 mL 3   No current facility-administered medications for this visit.    Allergies-reviewed and updated No Known Allergies  Social History   Socioeconomic History  . Marital status: Married    Spouse name: Not on file  . Number  of children: 2  . Years of education: Not on file  . Highest education level: Not on file  Occupational History  . Occupation: Passenger transport manager  Tobacco Use  . Smoking status: Never Smoker  . Smokeless tobacco: Never Used  Vaping Use  . Vaping Use: Never  used  Substance and Sexual Activity  . Alcohol use: No    Comment: Social  beer/wine  . Drug use: No  . Sexual activity: Yes    Partners: Male    Birth control/protection: None  Other Topics Concern  . Not on file  Social History Narrative  . Not on file   Social Determinants of Health   Financial Resource Strain:   . Difficulty of Paying Living Expenses: Not on file  Food Insecurity:   . Worried About Charity fundraiser in the Last Year: Not on file  . Ran Out of Food in the Last Year: Not on file  Transportation Needs:   . Lack of Transportation (Medical): Not on file  . Lack of Transportation (Non-Medical): Not on file  Physical Activity:   . Days of Exercise per Week: Not on file  . Minutes of Exercise per Session: Not on file  Stress:   . Feeling of Stress : Not on file  Social Connections:   . Frequency of Communication with Friends and Family: Not on file  . Frequency of Social Gatherings with Friends and Family: Not on file  . Attends Religious Services: Not on file  . Active Member of Clubs or Organizations: Not on file  . Attends Archivist Meetings: Not on file  . Marital Status: Not on file        Objective:  Physical Exam: BP 113/66   Pulse (!) 52   Temp 97.7 F (36.5 C) (Temporal)   Ht 5' 7.5" (1.715 m)   Wt 121 lb 6.4 oz (55.1 kg)   SpO2 (!) 6%   BMI 18.73 kg/m   Body mass index is 18.73 kg/m. Wt Readings from Last 3 Encounters:  02/10/20 121 lb 6.4 oz (55.1 kg)  03/10/19 127 lb (57.6 kg)  01/09/19 126 lb (57.2 kg)   Gen: NAD, resting comfortably HEENT: TMs normal bilaterally. OP clear. No thyromegaly noted.  CV: RRR with no murmurs appreciated Pulm: NWOB, CTAB with no crackles, wheezes, or rhonchi GI: Normal bowel sounds present. Soft, Nontender, Nondistended. MSK: no edema, cyanosis, or clubbing noted Skin: warm, dry Neuro: CN2-12 grossly intact. Strength 5/5 in upper and lower extremities. Reflexes symmetric and intact  bilaterally.  Psych: Normal affect and thought content     Everest Brod M. Jerline Pain, MD 02/10/2020 10:11 AM

## 2020-02-10 NOTE — Telephone Encounter (Signed)
Xray tech at [facility] stated this patient is early for her bone density scan which would need to be in December  and would like to know if patient needs to wait or if she can go ahead and do the scan for patient, return call to [phone]

## 2020-02-10 NOTE — Patient Instructions (Signed)
It was very nice to see you today!  We will check blood work today.  We will place a referral for you to get a colonoscopy and for you to have a bone density scan.  I will see you back in year for your next physical.  Please come back see me sooner if needed.  Take care, Dr Jerline Pain  Please try these tips to maintain a healthy lifestyle:   Eat at least 3 REAL meals and 1-2 snacks per day.  Aim for no more than 5 hours between eating.  If you eat breakfast, please do so within one hour of getting up.    Each meal should contain half fruits/vegetables, one quarter protein, and one quarter carbs (no bigger than a computer mouse)   Cut down on sweet beverages. This includes juice, soda, and sweet tea.     Drink at least 1 glass of water with each meal and aim for at least 8 glasses per day   Exercise at least 150 minutes every week.    Preventive Care 56-42 Years Old, Female Preventive care refers to visits with your health care provider and lifestyle choices that can promote health and wellness. This includes:  A yearly physical exam. This may also be called an annual well check.  Regular dental visits and eye exams.  Immunizations.  Screening for certain conditions.  Healthy lifestyle choices, such as eating a healthy diet, getting regular exercise, not using drugs or products that contain nicotine and tobacco, and limiting alcohol use. What can I expect for my preventive care visit? Physical exam Your health care provider will check your:  Height and weight. This may be used to calculate body mass index (BMI), which tells if you are at a healthy weight.  Heart rate and blood pressure.  Skin for abnormal spots. Counseling Your health care provider may ask you questions about your:  Alcohol, tobacco, and drug use.  Emotional well-being.  Home and relationship well-being.  Sexual activity.  Eating habits.  Work and work Statistician.  Method of birth  control.  Menstrual cycle.  Pregnancy history. What immunizations do I need?  Influenza (flu) vaccine  This is recommended every year. Tetanus, diphtheria, and pertussis (Tdap) vaccine  You may need a Td booster every 10 years. Varicella (chickenpox) vaccine  You may need this if you have not been vaccinated. Zoster (shingles) vaccine  You may need this after age 12. Measles, mumps, and rubella (MMR) vaccine  You may need at least one dose of MMR if you were born in 1957 or later. You may also need a second dose. Pneumococcal conjugate (PCV13) vaccine  You may need this if you have certain conditions and were not previously vaccinated. Pneumococcal polysaccharide (PPSV23) vaccine  You may need one or two doses if you smoke cigarettes or if you have certain conditions. Meningococcal conjugate (MenACWY) vaccine  You may need this if you have certain conditions. Hepatitis A vaccine  You may need this if you have certain conditions or if you travel or work in places where you may be exposed to hepatitis A. Hepatitis B vaccine  You may need this if you have certain conditions or if you travel or work in places where you may be exposed to hepatitis B. Haemophilus influenzae type b (Hib) vaccine  You may need this if you have certain conditions. Human papillomavirus (HPV) vaccine  If recommended by your health care provider, you may need three doses over 6 months. You may  receive vaccines as individual doses or as more than one vaccine together in one shot (combination vaccines). Talk with your health care provider about the risks and benefits of combination vaccines. What tests do I need? Blood tests  Lipid and cholesterol levels. These may be checked every 5 years, or more frequently if you are over 37 years old.  Hepatitis C test.  Hepatitis B test. Screening  Lung cancer screening. You may have this screening every year starting at age 74 if you have a 30-pack-year  history of smoking and currently smoke or have quit within the past 15 years.  Colorectal cancer screening. All adults should have this screening starting at age 54 and continuing until age 36. Your health care provider may recommend screening at age 83 if you are at increased risk. You will have tests every 1-10 years, depending on your results and the type of screening test.  Diabetes screening. This is done by checking your blood sugar (glucose) after you have not eaten for a while (fasting). You may have this done every 1-3 years.  Mammogram. This may be done every 1-2 years. Talk with your health care provider about when you should start having regular mammograms. This may depend on whether you have a family history of breast cancer.  BRCA-related cancer screening. This may be done if you have a family history of breast, ovarian, tubal, or peritoneal cancers.  Pelvic exam and Pap test. This may be done every 3 years starting at age 70. Starting at age 75, this may be done every 5 years if you have a Pap test in combination with an HPV test. Other tests  Sexually transmitted disease (STD) testing.  Bone density scan. This is done to screen for osteoporosis. You may have this scan if you are at high risk for osteoporosis. Follow these instructions at home: Eating and drinking  Eat a diet that includes fresh fruits and vegetables, whole grains, lean protein, and low-fat dairy.  Take vitamin and mineral supplements as recommended by your health care provider.  Do not drink alcohol if: ? Your health care provider tells you not to drink. ? You are pregnant, may be pregnant, or are planning to become pregnant.  If you drink alcohol: ? Limit how much you have to 0-1 drink a day. ? Be aware of how much alcohol is in your drink. In the [place] one drink equals one 12 oz bottle of beer (355 mL), one 5 oz glass of wine (148 mL), or one 1 oz glass of hard liquor (44 mL). Lifestyle  Take daily  care of your teeth and gums.  Stay active. Exercise for at least 30 minutes on 5 or more days each week.  Do not use any products that contain nicotine or tobacco, such as cigarettes, e-cigarettes, and chewing tobacco. If you need help quitting, ask your health care provider.  If you are sexually active, practice safe sex. Use a condom or other form of birth control (contraception) in order to prevent pregnancy and STIs (sexually transmitted infections).  If told by your health care provider, take low-dose aspirin daily starting at age 59. What's next?  Visit your health care provider once a year for a well check visit.  Ask your health care provider how often you should have your eyes and teeth checked.  Stay up to date on all vaccines. This information is not intended to replace advice given to you by your health care provider. Make sure you discuss  any questions you have with your health care provider. Document Revised: 01/31/2018 Document Reviewed: 01/31/2018 Elsevier Patient Education  2020 [employer].

## 2020-02-11 ENCOUNTER — Encounter: Payer: Self-pay | Admitting: Family Medicine

## 2020-02-11 ENCOUNTER — Encounter: Payer: Self-pay | Admitting: Gastroenterology

## 2020-02-11 LAB — LIPID PANEL
Cholesterol: 216 mg/dL — ABNORMAL HIGH (ref <200)
HDL: 85 mg/dL (ref >=50)
LDL Cholesterol (Calc): 113 mg/dL (calc) — ABNORMAL HIGH
Non-HDL Cholesterol (Calc): 131 mg/dL (calc) — ABNORMAL HIGH (ref <130)
Total CHOL/HDL Ratio: 2.5 (calc) (ref <5.0)
Triglycerides: 81 mg/dL (ref <150)

## 2020-02-11 LAB — COMPREHENSIVE METABOLIC PANEL
AG Ratio: 2.1 (calc) (ref 1.0–2.5)
ALT: 32 U/L — ABNORMAL HIGH (ref 6–29)
AST: 29 U/L (ref 10–35)
Albumin: 4.7 g/dL (ref 3.6–5.1)
Alkaline phosphatase (APISO): 58 U/L (ref 37–153)
BUN: 16 mg/dL (ref 7–25)
CO2: 31 mmol/L (ref 20–32)
Calcium: 9.7 mg/dL (ref 8.6–10.4)
Chloride: 103 mmol/L (ref 98–110)
Creat: 0.82 mg/dL (ref 0.50–1.05)
Globulin: 2.2 g/dL (calc) (ref 1.9–3.7)
Glucose, Bld: 93 mg/dL (ref 65–99)
Potassium: 4.2 mmol/L (ref 3.5–5.3)
Sodium: 141 mmol/L (ref 135–146)
Total Bilirubin: 0.7 mg/dL (ref 0.2–1.2)
Total Protein: 6.9 g/dL (ref 6.1–8.1)

## 2020-02-11 LAB — TSH: TSH: 1.58 mIU/L

## 2020-02-11 LAB — CBC
HCT: 36.5 % (ref 35.0–45.0)
Hemoglobin: 11.9 g/dL (ref 11.7–15.5)
MCH: 30.9 pg (ref 27.0–33.0)
MCHC: 32.6 g/dL (ref 32.0–36.0)
MCV: 94.8 fL (ref 80.0–100.0)
MPV: 11.1 fL (ref 7.5–12.5)
Platelets: 293 10*3/uL (ref 140–400)
RBC: 3.85 10*6/uL (ref 3.80–5.10)
RDW: 14 % (ref 11.0–15.0)
WBC: 4.8 10*3/uL (ref 3.8–10.8)

## 2020-02-11 LAB — VITAMIN D 25 HYDROXY (VIT D DEFICIENCY, FRACTURES): Vit D, 25-Hydroxy: 43 ng/mL (ref 30–100)

## 2020-02-11 NOTE — Progress Notes (Signed)
Please inform patient of the following:  Cholesterol mildly elevated but everything else is NORMAL. We can recheck in a year. Would like for her to keep working on diet and exercise.  Algis Greenhouse. Jerline Pain, MD 02/11/2020 9:15 AM

## 2020-02-12 ENCOUNTER — Inpatient Hospital Stay: Admission: RE | Admit: 2020-02-12 | Payer: 59 | Source: Ambulatory Visit

## 2020-02-13 ENCOUNTER — Other Ambulatory Visit: Payer: Self-pay

## 2020-02-13 MED ORDER — ATORVASTATIN CALCIUM 10 MG PO TABS: 10.0000 mg | ORAL_TABLET | Freq: Every day | ORAL | 3 refills | Status: DC

## 2020-04-09 ENCOUNTER — Ambulatory Visit (AMBULATORY_SURGERY_CENTER): Payer: Self-pay | Admitting: *Deleted

## 2020-04-09 ENCOUNTER — Other Ambulatory Visit: Payer: Self-pay

## 2020-04-09 ENCOUNTER — Encounter: Payer: Self-pay | Admitting: Gastroenterology

## 2020-04-09 VITALS — Ht 67.5 in | Wt 119.0 lb

## 2020-04-09 DIAGNOSIS — Z1211 Encounter for screening for malignant neoplasm of colon: Secondary | ICD-10-CM

## 2020-04-09 MED ORDER — PLENVU 140 G PO SOLR: 1.0000 | Freq: Once | ORAL | 0 refills | Status: AC

## 2020-04-09 NOTE — Progress Notes (Signed)
Patient is here in-person for PV. Patient denies any allergies to eggs or soy. Patient denies any problems with anesthesia/sedation. Patient denies any oxygen use at home. Patient denies taking any diet/weight loss medications or blood thinners. Patient is not being treated for MRSA or C-diff. Patient is aware of our care-partner policy and TZGYF-74 safety protocol. EMMI education assisgned to the patient for the procedure, sent to MyChart.   COVID-19 vaccines completed on 09/03/19, per patient.   Prep Prescription coupon given to the patient.

## 2020-04-20 ENCOUNTER — Telehealth: Payer: Self-pay

## 2020-04-20 ENCOUNTER — Telehealth: Payer: Self-pay | Admitting: Gastroenterology

## 2020-04-20 ENCOUNTER — Other Ambulatory Visit: Payer: Self-pay

## 2020-04-20 DIAGNOSIS — Z13220 Encounter for screening for lipoid disorders: Secondary | ICD-10-CM

## 2020-04-20 NOTE — Telephone Encounter (Signed)
Called and spoke with pt and future lab appointment made and lab ordered.

## 2020-04-20 NOTE — Telephone Encounter (Signed)
Patient called, left message for the patient that we do not do PA, to use her coupon.  Called pharmacy and notified them to fill the Plenvu using the coupon attached.

## 2020-04-20 NOTE — Telephone Encounter (Signed)
Patient is requesting lipid panel with her atorvastatin to see if this has improved any  Please advise

## 2020-04-20 NOTE — Telephone Encounter (Signed)
Patient called stating that prep for procedure needs PA. Her procedure is on Friday. Please call patient.

## 2020-04-20 NOTE — Telephone Encounter (Signed)
See below

## 2020-04-20 NOTE — Telephone Encounter (Signed)
Wann with me but recommend she wait at least 3 months after being on the medication.  Algis Greenhouse. Jerline Pain, MD 04/20/2020 3:34 PM

## 2020-04-23 ENCOUNTER — Other Ambulatory Visit: Payer: Self-pay

## 2020-04-23 ENCOUNTER — Ambulatory Visit (AMBULATORY_SURGERY_CENTER): Payer: 59 | Admitting: Gastroenterology

## 2020-04-23 ENCOUNTER — Encounter: Payer: Self-pay | Admitting: Gastroenterology

## 2020-04-23 ENCOUNTER — Other Ambulatory Visit: Payer: Self-pay | Admitting: Gastroenterology

## 2020-04-23 VITALS — BP 109/59 | HR 59 | Temp 98.1°F | Resp 13 | Ht 67.5 in | Wt 119.0 lb

## 2020-04-23 DIAGNOSIS — D124 Benign neoplasm of descending colon: Secondary | ICD-10-CM

## 2020-04-23 DIAGNOSIS — Z1211 Encounter for screening for malignant neoplasm of colon: Secondary | ICD-10-CM

## 2020-04-23 MED ORDER — SODIUM CHLORIDE 0.9 % IV SOLN: 500.0000 mL | Freq: Once | INTRAVENOUS | Status: DC

## 2020-04-23 NOTE — Patient Instructions (Signed)
YOU HAD AN ENDOSCOPIC PROCEDURE TODAY AT THE [facility]:   Refer to the procedure report that was given to you for any specific questions about what was found during the examination.  If the procedure report does not answer your questions, please call your gastroenterologist to clarify.  If you requested that your care partner not be given the details of your procedure findings, then the procedure report has been included in a sealed envelope for you to review at your convenience later.  YOU SHOULD EXPECT: Some feelings of bloating in the abdomen. Passage of more gas than usual.  Walking can help get rid of the air that was put into your GI tract during the procedure and reduce the bloating. If you had a lower endoscopy (such as a colonoscopy or flexible sigmoidoscopy) you may notice spotting of blood in your stool or on the toilet paper. If you underwent a bowel prep for your procedure, you may not have a normal bowel movement for a few days.  Please Note:  You might notice some irritation and congestion in your nose or some drainage.  This is from the oxygen used during your procedure.  There is no need for concern and it should clear up in a day or so.  SYMPTOMS TO REPORT IMMEDIATELY:   Following lower endoscopy (colonoscopy or flexible sigmoidoscopy):  Excessive amounts of blood in the stool  Significant tenderness or worsening of abdominal pains  Swelling of the abdomen that is new, acute  Fever of 100F or higher  For urgent or emergent issues, a gastroenterologist can be reached at any hour by calling [phone]. Do not use MyChart messaging for urgent concerns.    DIET:  We do recommend a small meal at first, but then you may proceed to your regular diet.  Drink plenty of fluids but you should avoid alcoholic beverages for 24 hours.  ACTIVITY:  You should plan to take it easy for the rest of today and you should NOT DRIVE or use heavy machinery until tomorrow (because  of the sedation medicines used during the test).    FOLLOW UP: Our staff will call the number listed on your records 48-72 hours following your procedure to check on you and address any questions or concerns that you may have regarding the information given to you following your procedure. If we do not reach you, we will leave a message.  We will attempt to reach you two times.  During this call, we will ask if you have developed any symptoms of COVID 19. If you develop any symptoms (ie: fever, flu-like symptoms, shortness of breath, cough etc.) before then, please call [phone].  If you test positive for Covid 19 in the 2 weeks post procedure, please call and report this information to us.    If any biopsies were taken you will be contacted by phone or by letter within the next 1-3 weeks.  Please call us at [phone] if you have not heard about the biopsies in 3 weeks.    SIGNATURES/CONFIDENTIALITY: You and/or your care partner have signed paperwork which will be entered into your electronic medical record.  These signatures attest to the fact that that the information above on your After Visit Summary has been reviewed and is understood.  Full responsibility of the confidentiality of this discharge information lies with you and/or your care-partner. 

## 2020-04-23 NOTE — Progress Notes (Signed)
Called to room to assist during endoscopic procedure.  Patient ID and intended procedure confirmed with present staff. Received instructions for my participation in the procedure from the performing physician.  

## 2020-04-23 NOTE — Op Note (Signed)
[facility] Patient Name: Patty [name] Procedure Date: 04/23/2020 9:31 AM MRN: [id number] Endoscopist: Thornton Park MD, MD Age: 49 Referring MD:  Date of Birth: [birth year] Gender: Female Account #: [id number] Procedure:                Colonoscopy Indications:              Screening for colorectal malignant neoplasm                           Colonoscopy 15 years ago for constipation                           No known family history of colon cancer or polyps Medicines:                Monitored Anesthesia Care Procedure:                Pre-Anesthesia Assessment:                           - Prior to the procedure, a History and Physical                            was performed, and patient medications and                            allergies were reviewed. The patient's tolerance of                            previous anesthesia was also reviewed. The risks                            and benefits of the procedure and the sedation                            options and risks were discussed with the patient.                            All questions were answered, and informed consent                            was obtained. Prior Anticoagulants: The patient has                            taken no previous anticoagulant or antiplatelet                            agents. ASA Grade Assessment: II - A patient with                            mild systemic disease. After reviewing the risks                            and benefits, the patient was deemed in  satisfactory condition to undergo the procedure.                           After obtaining informed consent, the colonoscope                            was passed under direct vision. Throughout the                            procedure, the patient's blood pressure, pulse, and                            oxygen saturations were monitored continuously. The                            Colonoscope was  introduced through the anus and                            advanced to the 3 cm into the ileum. The                            colonoscopy was performed with moderate difficulty                            due to a redundant colon and significant looping.                            Successful completion of the procedure was aided by                            applying abdominal pressure. The patient tolerated                            the procedure well. The quality of the bowel                            preparation was good. The terminal ileum, ileocecal                            valve, appendiceal orifice, and rectum were                            photographed. Scope In: 9:38:47 AM Scope Out: 9:57:26 AM Scope Withdrawal Time: 0 hours 13 minutes 34 seconds  Total Procedure Duration: 0 hours 18 minutes 39 seconds  Findings:                 The perianal and digital rectal examinations were                            normal.                           A 2-3 mm polyp was found in the descending colon on  the top of a fold. The polyp was sessile. The top                            of the polyp was removed with a cold snare. Forceps                            were then used to resect the base of the polyp.                            Resection and retrieval were complete. Estimated                            blood loss was minimal.                           There is significant looping in the sigmoid colon.                            The exam was otherwise without abnormality on                            direct and retroflexion views except for internal                            hemorrhoids. Complications:            No immediate complications. Estimated blood loss:                            Minimal. Estimated Blood Loss:     Estimated blood loss was minimal. Impression:               - One 2 mm polyp in the descending colon, removed                             with a cold snare. Resected and retrieved.                           - The examination was otherwise normal on direct                            and retroflexion views. Recommendation:           - Patient has a contact number available for                            emergencies. The signs and symptoms of potential                            delayed complications were discussed with the                            patient. Return to normal activities tomorrow.  Written discharge instructions were provided to the                            patient.                           - Resume previous diet.                           - Continue present medications.                           - Await pathology results.                           - Repeat colonoscopy date to be determined after                            pending pathology results are reviewed for                            surveillance.                           - Emerging evidence supports eating a diet of                            fruits, vegetables, grains, calcium, and yogurt                            while reducing red meat and alcohol may reduce the                            risk of colon cancer.                           - Thank you for allowing me to be involved in your                            colon cancer prevention. Thornton Park MD, MD 04/23/2020 10:02:51 AM This report has been signed electronically.

## 2020-04-23 NOTE — Progress Notes (Signed)
VS by CW  Pt's states no medical or surgical changes since previsit or office visit.  

## 2020-04-27 ENCOUNTER — Telehealth: Payer: Self-pay | Admitting: *Deleted

## 2020-04-27 ENCOUNTER — Telehealth: Payer: Self-pay

## 2020-04-27 NOTE — Telephone Encounter (Signed)
First post procedure follow up call, no answer 

## 2020-04-27 NOTE — Telephone Encounter (Signed)
  Follow up Call-  Call back number 04/23/2020  Post procedure Call Back phone  # [phone]  Permission to leave phone message Yes  Some recent data might be hidden     Patient questions:  Do you have a fever, pain , or abdominal swelling? No. Pain Score  0 *  Have you tolerated food without any problems? Yes.    Have you been able to return to your normal activities? Yes.    Do you have any questions about your discharge instructions: Diet   No. Medications  No. Follow up visit  No.  Do you have questions or concerns about your Care? No.  Actions: * If pain score is 4 or above: No action needed, pain <4.   1. Have you developed a fever since your procedure? no  2.   Have you had an respiratory symptoms (SOB or cough) since your procedure? no  3.   Have you tested positive for COVID 19 since your procedure no  4.   Have you had any family members/close contacts diagnosed with the COVID 19 since your procedure?  no   If yes to any of these questions please route to Joylene John, RN and Joella Prince, RN

## 2020-05-03 ENCOUNTER — Encounter: Payer: Self-pay | Admitting: Gastroenterology

## 2020-05-10 ENCOUNTER — Ambulatory Visit (INDEPENDENT_AMBULATORY_CARE_PROVIDER_SITE_OTHER)
Admission: RE | Admit: 2020-05-10 | Discharge: 2020-05-10 | Disposition: A | Discharge status: Home / Self Care | Payer: 59 | Source: Ambulatory Visit | Attending: Family Medicine | Admitting: Family Medicine

## 2020-05-10 ENCOUNTER — Other Ambulatory Visit: Payer: Self-pay

## 2020-05-10 DIAGNOSIS — Z0001 Encounter for general adult medical examination with abnormal findings: Secondary | ICD-10-CM | POA: Diagnosis not present

## 2020-05-10 DIAGNOSIS — M8580 Other specified disorders of bone density and structure, unspecified site: Secondary | ICD-10-CM | POA: Diagnosis not present

## 2020-05-11 NOTE — Progress Notes (Signed)
Please inform patient of the following:  Bone density scan showed stable osteopenia. Would like for her to continue calcium and vitamin D and we can repeat in 2 years.  Patty [name]. Jerline Pain, MD 05/11/2020 9:29 AM

## 2020-05-14 ENCOUNTER — Telehealth: Payer: Self-pay

## 2020-05-14 DIAGNOSIS — M170 Bilateral primary osteoarthritis of knee: Secondary | ICD-10-CM

## 2020-05-14 MED ORDER — EUFLEXXA 20 MG/2ML IX SOSY: 1.0000 | PREFILLED_SYRINGE | INTRA_ARTICULAR | 3 refills | Status: DC

## 2020-05-14 NOTE — Telephone Encounter (Signed)
Sending in Vaughn to [facility], enough to do both knees.  She will have to give them the okay to ship the medication to [place].

## 2020-05-14 NOTE — Telephone Encounter (Signed)
Left detailed message for patient to contact specialty pharmacy to release shipment to our office.  When received, we can get her in for injections.

## 2020-05-14 NOTE — Telephone Encounter (Signed)
Patient called to see if she can get in for a Euflexxa injection before the end of the year.  Last OV noted this was the preferred agent for her insurance.

## 2020-06-21 ENCOUNTER — Other Ambulatory Visit: Payer: Self-pay

## 2020-06-24 ENCOUNTER — Ambulatory Visit: Payer: 59 | Admitting: Family Medicine

## 2020-06-24 ENCOUNTER — Other Ambulatory Visit: Payer: Self-pay

## 2020-06-25 ENCOUNTER — Other Ambulatory Visit: Payer: Self-pay

## 2020-06-25 ENCOUNTER — Telehealth (INDEPENDENT_AMBULATORY_CARE_PROVIDER_SITE_OTHER): Payer: 59 | Admitting: Sports Medicine

## 2020-06-25 ENCOUNTER — Telehealth: Payer: Self-pay | Admitting: Sports Medicine

## 2020-06-25 DIAGNOSIS — R208 Other disturbances of skin sensation: Secondary | ICD-10-CM

## 2020-06-25 DIAGNOSIS — M170 Bilateral primary osteoarthritis of knee: Secondary | ICD-10-CM | POA: Diagnosis not present

## 2020-06-25 NOTE — Assessment & Plan Note (Signed)
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

## 2020-06-25 NOTE — Telephone Encounter (Signed)
I submitted to Monovisc  now waiting on BID and to see if PA is required. - CF

## 2020-06-25 NOTE — Progress Notes (Signed)
   Virtual Visit via WebEx/MyChart   I connected with  Patty [name]  on 06/25/20 via WebEx/MyChart/Doximity Video and verified that I am speaking with the correct person using two identifiers.   I discussed the limitations, risks, security and privacy concerns of performing an evaluation and management service by WebEx/MyChart/Doximity Video, including the higher likelihood of inaccurate diagnosis and treatment, and the availability of in person appointments.  We also discussed the likely need of an additional face to face encounter for complete and high quality delivery of care.  I also discussed with the patient that there may be a patient responsible charge related to this service. The patient expressed understanding and wishes to proceed.  Provider location is in medical facility. Patient location is at their home, different from provider location. People involved in care of the patient during this telehealth encounter were myself, my nurse/medical assistant, and my front office/scheduling team member.  Review of Systems: No fevers, chills, night sweats, weight loss, chest pain, or shortness of breath.   Objective Findings:    General: Speaking full sentences, no audible heavy breathing.  Sounds alert and appropriately interactive.  Appears well.  Face symmetric.  Extraocular movements intact.  Pupils equal and round.  No nasal flaring or accessory muscle use visualized.  Independent interpretation of tests performed by another provider:   None.  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 This sounds more like a plantar nerve entrapment  syndrome, she had discontinued using her custom orthotics, I would like her to restart the orthotics for at least a week or 2 before we consider further intervention.  Primary osteoarthritis of both knees Known bilateral knee osteoarthritis, we did Monovisc back in 2020 and she did well, x-rays were last done in February 2021. It sounds like Euflexxa is her preferred agent as of last year, I would like to work again on approval for bilateral Monovisc with the understanding that we can use any preferred agent if Monovisc is not preferred.   I discussed the above assessment and treatment plan with the patient. The patient was provided an opportunity to ask questions and all were answered. The patient agreed with the plan and demonstrated an understanding of the instructions.   The patient was advised to call back or seek an in-person evaluation if the symptoms worsen or if the condition fails to improve as anticipated.   I provided 30 minutes of face to face and non-face-to-face time during this encounter date, time was needed to gather information, review chart, records, communicate/coordinate with staff remotely, as well as complete documentation.   ___________________________________________ Gwen Her. Dianah Field, M.D., ABFM., CAQSM. Primary Care and [place] Instructor of [place] [employer] [facility]

## 2020-06-25 NOTE — Telephone Encounter (Signed)
Please work on [employer] for both knees, if Monovisc is not preferred we can certainly try sending in Speculator, just let me know.

## 2020-06-25 NOTE — Assessment & Plan Note (Signed)
Known bilateral knee osteoarthritis, we did Monovisc back in 2020 and she did well, x-rays were last done in February 2021. It sounds like Euflexxa is her preferred agent as of last year, I would like to work again on approval for bilateral Monovisc with the understanding that we can use any preferred agent if Monovisc is not preferred.

## 2020-06-29 ENCOUNTER — Other Ambulatory Visit: Payer: Self-pay

## 2020-06-29 ENCOUNTER — Other Ambulatory Visit (INDEPENDENT_AMBULATORY_CARE_PROVIDER_SITE_OTHER): Payer: 59

## 2020-06-29 DIAGNOSIS — Z13220 Encounter for screening for lipoid disorders: Secondary | ICD-10-CM | POA: Diagnosis not present

## 2020-06-29 LAB — LIPID PANEL
Cholesterol: 163 mg/dL (ref 0–200)
HDL: 77.5 mg/dL (ref >39.00)
LDL Cholesterol: 71 mg/dL (ref 0–99)
NonHDL: 85.14
Total CHOL/HDL Ratio: 2
Triglycerides: 69 mg/dL (ref 0.0–149.0)
VLDL: 13.8 mg/dL (ref 0.0–40.0)

## 2020-06-29 NOTE — Progress Notes (Signed)
Please inform patient of the following:  Good news! Cholesterol level is much better. Would like for her to keep up the good work and we can recheck in a few years.  Patty [name]. Jerline Pain, MD 06/29/2020 11:14 AM

## 2020-06-29 NOTE — Addendum Note (Signed)
Addended by: Brandy Hale on: 06/29/2020 08:35 AM   Modules accepted: Orders

## 2020-07-02 ENCOUNTER — Telehealth: Payer: Self-pay

## 2020-07-02 NOTE — Telephone Encounter (Signed)
FYI

## 2020-07-02 NOTE — Telephone Encounter (Signed)
I received BId and patient is responsible for cost until deductible is met and a Prior Josem Kaufmann is required. I will call patient to see if she wants to proceed. - CF

## 2020-07-02 NOTE — Telephone Encounter (Signed)
Reason for Call Symptomatic / Request for Health Information Initial Comment Transferred from office, PT is having heart palpitations. PT disconnected before being transferred to [place]. Translation No Disp. Time Eilene Ghazi Time) Disposition Final User 07/02/2020 10:30:00 AM Attempt made - message left Emch, RN, Ronalee Belts 07/02/2020 10:40:29 AM Attempt made - message left Emch, RN, Ronalee Belts 07/02/2020 10:55:58 AM FINAL ATTEMPT MADE - message left Yes Emch, RN, Ronalee Belts

## 2020-07-05 ENCOUNTER — Telehealth: Payer: Self-pay

## 2020-07-05 ENCOUNTER — Other Ambulatory Visit: Payer: Self-pay

## 2020-07-05 ENCOUNTER — Encounter: Payer: Self-pay | Admitting: Physician Assistant

## 2020-07-05 ENCOUNTER — Ambulatory Visit: Payer: 59 | Admitting: Physician Assistant

## 2020-07-05 VITALS — BP 108/70 | HR 60 | Temp 97.8°F | Resp 14 | Ht 67.5 in | Wt 123.0 lb

## 2020-07-05 DIAGNOSIS — R002 Palpitations: Secondary | ICD-10-CM

## 2020-07-05 DIAGNOSIS — E7800 Pure hypercholesterolemia, unspecified: Secondary | ICD-10-CM | POA: Diagnosis not present

## 2020-07-05 LAB — COMPREHENSIVE METABOLIC PANEL
ALT: 22 U/L (ref 0–35)
AST: 26 U/L (ref 0–37)
Albumin: 4.7 g/dL (ref 3.5–5.2)
Alkaline Phosphatase: 54 U/L (ref 39–117)
BUN: 16 mg/dL (ref 6–23)
CO2: 32 mEq/L (ref 19–32)
Calcium: 9.5 mg/dL (ref 8.4–10.5)
Chloride: 102 mEq/L (ref 96–112)
Creatinine, Ser: 0.7 mg/dL (ref 0.40–1.20)
GFR: 100.52 mL/min (ref >60.00)
Glucose, Bld: 89 mg/dL (ref 70–99)
Potassium: 4 mEq/L (ref 3.5–5.1)
Sodium: 139 mEq/L (ref 135–145)
Total Bilirubin: 0.8 mg/dL (ref 0.2–1.2)
Total Protein: 7.5 g/dL (ref 6.0–8.3)

## 2020-07-05 LAB — CBC
HCT: 37.3 % (ref 36.0–46.0)
Hemoglobin: 12.4 g/dL (ref 12.0–15.0)
MCHC: 33.4 g/dL (ref 30.0–36.0)
MCV: 92.7 fl (ref 78.0–100.0)
Platelets: 263 10*3/uL (ref 150.0–400.0)
RBC: 4.02 Mil/uL (ref 3.87–5.11)
RDW: 14.6 % (ref 11.5–15.5)
WBC: 4.3 10*3/uL (ref 4.0–10.5)

## 2020-07-05 LAB — TSH: TSH: 1.35 u[IU]/mL (ref 0.35–4.50)

## 2020-07-05 NOTE — Patient Instructions (Signed)
Your EKG looked good. Please go to the lab today and we will call with results once received. I will refer you to cardiology, per your request, and they will call to schedule.  Please call us sooner if any questions or concerns.  Be sure to drink plenty of fluids this week.  Go to the ER if any sudden chest pain, pressure, shortness of breath, one sided weakness, or other acute worsening changes.

## 2020-07-05 NOTE — Progress Notes (Signed)
Subjective:    Patient ID: Patty [name], female    DOB: 19-Dec-1970, 50 y.o.   MRN: [id number]  HPI Pt is a 50 yo pleasant female with hx of hyperlipidemia presenting today for awareness of heartbeat x 4 days. She states it seems to be worse when she is lying down getting ready to sleep or when she is sitting still.  She is training for a Marathon in April. She has been a runner consistently for the last 10 years. She ran 4.2 miles yesterday without any issues during or after her run.   She has not seen a cardiologist before, but she is wanting to have a referral prior to her race this year . Urgent care provider at the beginning of the month mentioned she had a murmur and she said she had never been told that previously; states she has been nervous since she was told that.  Non-smoker, does not drink ETOH. Two cups of Keurig coffee in the morning. No sodas.   She tried to stop Allegra-D and Atorvastatin the last 4 days to see if this would make a difference, but it did not. She also takes Calcium, Vitamin C & D and has continued on those.  No other OTC supplements. She has been trying to push fluids this weekend.  Parents have high cholesterol. Mom's parents died of heart issues, but unsure of diagnosis (passed in 52s and 51 years of age).   She has been monitoring her HR and has an average of 60-70 bpm. She has not seen any HR above 100 bpm when she has this "awareness." She denies any chest pain, but says the sensation just makes her uncomfortable. She does have some tension in both sides of her neck. No back pain. No SOB.   Review of Systems  Constitutional: Negative for activity change, appetite change, fever and unexpected weight change.  Respiratory: Negative for chest tightness, shortness of breath and wheezing.   Cardiovascular: Positive for palpitations. Negative for chest pain and leg swelling.  Gastrointestinal: Negative for abdominal pain and nausea.  Neurological:  Negative for dizziness, syncope, weakness and headaches.       Objective:   Physical Exam Constitutional:      General: She is not in acute distress.    Appearance: Normal appearance. She is not ill-appearing, toxic-appearing or diaphoretic.  HENT:     Head: Normocephalic and atraumatic.  Eyes:     Extraocular Movements: Extraocular movements intact.     Pupils: Pupils are equal, round, and reactive to light.  Cardiovascular:     Rate and Rhythm: Normal rate and regular rhythm.     Pulses: Normal pulses.     Heart sounds: Normal heart sounds.  Pulmonary:     Effort: Pulmonary effort is normal.     Breath sounds: Normal breath sounds.  Abdominal:     General: Abdomen is flat. Bowel sounds are normal.     Palpations: Abdomen is soft.  Musculoskeletal:        General: Normal range of motion.  Neurological:     General: No focal deficit present.     Mental Status: She is alert and oriented to person, place, and time.  Psychiatric:        Mood and Affect: Mood normal.        Thought Content: Thought content normal.        Judgment: Judgment normal.     Vitals with BMI 07/05/2020 04/23/2020 04/23/2020  Height 5' 7.5" - -  Weight 123 lbs - -  BMI 03.01 - -  Systolic 314 388 875  Diastolic 70 59 62  Pulse 60 59 64  Some encounter information is confidential and restricted. Go to Review Flowsheets activity to see all data.   Pulse oximetry on room air is 99%.      Assessment & Plan:   1. Heart palpitations / awareness of heartbeat - No acute signs of distress in office. EKG shows sinus bradycardia with 53 bpm. No blocks. No signs of ischemia. No delta waves present. Reassured patient today. Encouraged her to drink plenty of fluids this week. I will refer her to cardiology per her request. I will also check basic labs (CBC, CMET, TSH) to rule out any other pathology. She will go to the ER for any sudden change in symptoms.  2. Hyperlipidemia - Total was 163 on 06/29/20, which  is down from 216 on 02/10/20. She is taking Atorvastatin 10 mg every day to every other day (states she doesn't like taking too many medications). Will continue regular f/up on this with Dr. Jerline Pain.

## 2020-07-05 NOTE — Telephone Encounter (Signed)
Patient is being seen today in office by [facility]

## 2020-07-05 NOTE — Telephone Encounter (Signed)
Patient is scheduled with Alyssa   Nurse Assessment Nurse: Harlow Mares, RN, Suanne Marker Date/Time Eilene Ghazi Time): 07/05/2020 8:31:59 AM Confirm and document reason for call. If symptomatic, describe symptoms. ---Caller states c/o palpations in her neck, possibly radiating from chest. Does the patient have any new or worsening symptoms? ---Yes Will a triage be completed? ---Yes Related visit to physician within the last 2 weeks? ---No Does the PT have any chronic conditions? (i.e. diabetes, asthma, this includes High risk factors for pregnancy, etc.) ---Yes List chronic conditions. ---HIGH CHOL; Is the patient pregnant or possibly pregnant? (Ask all females between the ages of 42-55) ---No Is this a behavioral health or substance abuse call? ---No Guidelines Guideline Title Affirmed Question Affirmed Notes Nurse Date/Time Eilene Ghazi Time) Heart Rate and Heartbeat Questions Palpitations Harlow Mares, RN, Suanne Marker 07/05/2020 8:37:03 AM Disp. Time Eilene Ghazi Time) Disposition Final User 07/05/2020 8:45:23 [place], RN, Rosalyn Charters Disagree/Comply Comply Caller Understands Yes PLEASE NOTE: All timestamps contained within this report are represented as [place] Standard Time. CONFIDENTIALTY NOTICE: This fax transmission is intended only for the addressee. It contains information that is legally privileged, confidential or otherwise protected from use or disclosure. If you are not the intended recipient, you are strictly prohibited from reviewing, disclosing, copying using or disseminating any of this information or taking any action in reliance on or regarding this information. If you have received this fax in error, please notify us immediately by telephone so that we can arrange for its return to [place]. Phone: [phone], Toll-Free: [phone], Fax: [phone] Page: 2 of 2 Call Id: [id number] PreDisposition Call Doctor Care Advice Given Per Guideline HOME CARE: * You should be able to treat  this at home. REASSURANCE AND EDUCATION - PALPITATIONS: * Everybody experiences palpitations at some point in their lives. In many circumstances it is simply a heightened awareness of the heart's normal beating. * People with anxiety or stress may describe a 'rapid heartbeat' or 'pounding' in their chest from their heart beating. * Here is some care advice that should help. HEALTH BASICS: * Sleep: Try to get a sufficient amount of sleep. Lack of sleep can aggravate palpitations. Most people need 7 to 8 hours of sleep each night. * Exercise: Regular exercise will improve your overall health, improve your mood, and is a simple method to reduce stress. * Diet: Eat a balanced healthy diet. * Liquid Intake: Drink adequate liquids, 6 to 8 glasses of water daily. AVOID CAFFEINE: * Avoid caffeine-containing beverages (Reason: caffeine is a stimulant and can aggravate palpitations). * Examples include coffee, tea, colas, [address] [employer], and some 'energy drinks'. LIMIT ALCOHOL: * Limit your alcohol consumption to no more than 2 drinks a day. * Ideally, eliminate alcohol entirely for the next two weeks. EXPECTED COURSE: * If your symptoms do not improve over the next couple days, then you should make an appointment to see your doctor. CALL BACK IF: * Chest pain, lightheadedness or difficulty breathing occurs * Heart beating over 140 beats / minute * More than 3 extra or skipped beats / minute * You become worse CARE ADVICE given per Heart Rate and Heartbeat Questions (Adult) guideline

## 2020-07-06 NOTE — Telephone Encounter (Signed)
Looks like she has a question about Monovisc, Patty [name] it looks like you have already run this through.

## 2020-07-07 ENCOUNTER — Ambulatory Visit: Payer: 59 | Admitting: Internal Medicine

## 2020-07-07 ENCOUNTER — Ambulatory Visit (INDEPENDENT_AMBULATORY_CARE_PROVIDER_SITE_OTHER): Payer: 59

## 2020-07-07 ENCOUNTER — Encounter: Payer: Self-pay | Admitting: *Deleted

## 2020-07-07 ENCOUNTER — Other Ambulatory Visit: Payer: Self-pay

## 2020-07-07 ENCOUNTER — Encounter: Payer: Self-pay | Admitting: Internal Medicine

## 2020-07-07 VITALS — BP 110/56 | Ht 67.0 in | Wt 123.0 lb

## 2020-07-07 DIAGNOSIS — R011 Cardiac murmur, unspecified: Secondary | ICD-10-CM | POA: Diagnosis not present

## 2020-07-07 DIAGNOSIS — R002 Palpitations: Secondary | ICD-10-CM

## 2020-07-07 DIAGNOSIS — E782 Mixed hyperlipidemia: Secondary | ICD-10-CM | POA: Diagnosis not present

## 2020-07-07 LAB — TSH: TSH: 2.01 u[IU]/mL (ref 0.450–4.500)

## 2020-07-07 NOTE — Patient Instructions (Addendum)
Medication Instructions:  Your physician recommends that you continue on your current medications as directed. Please refer to the Current Medication list given to you today.  *If you need a refill on your cardiac medications before your next appointment, please call your pharmacy*   Lab Work: TODAY: TSH  If you have labs (blood work) drawn today and your tests are completely normal, you will receive your results only by: Marland Kitchen MyChart Message (if you have MyChart) OR . A paper copy in the mail If you have any lab test that is abnormal or we need to change your treatment, we will call you to review the results.   Testing/Procedures:  Your provider has requested that you have a Cardiac Calcium Score CT.  ZIO XT- Long Term Monitor Instructions   Your physician has requested you wear your ZIO patch monitor__14__days.   This is a single patch monitor.  [employer] supplies one patch monitor per enrollment.  Additional stickers are not available.   Please do not apply patch if you will be having a Nuclear Stress Test, Echocardiogram, Cardiac CT, MRI, or Chest Xray during the time frame you would be wearing the monitor. The patch cannot be worn during these tests.  You cannot remove and re-apply the ZIO XT patch monitor.   Your ZIO patch monitor will be sent USPS Priority mail from [facility] directly to your home address. The monitor may also be mailed to a PO BOX if home delivery is not available.   It may take 3-5 days to receive your monitor after you have been enrolled.   Once you have received you monitor, please review enclosed instructions.  Your monitor has already been registered assigning a specific monitor serial # to you.   Applying the monitor   Shave hair from upper left chest.   Hold abrader disc by orange tab.  Rub abrader in 40 strokes over left upper chest as indicated in your monitor instructions.   Clean area with 4 enclosed alcohol pads .  Use all pads to assure  are is cleaned thoroughly.  Let dry.   Apply patch as indicated in monitor instructions.  Patch will be place under collarbone on left side of chest with arrow pointing upward.   Rub patch adhesive wings for 2 minutes.Remove white label marked "1".  Remove white label marked "2".  Rub patch adhesive wings for 2 additional minutes.   While looking in a mirror, press and release button in center of patch.  A small green light will flash 3-4 times .  This will be your only indicator the monitor has been turned on.     Do not shower for the first 24 hours.  You may shower after the first 24 hours.   Press button if you feel a symptom. You will hear a small click.  Record Date, Time and Symptom in the Patient Log Book.   When you are ready to remove patch, follow instructions on last 2 pages of Patient Log Book.  Stick patch monitor onto last page of Patient Log Book.   Place Patient Log Book in [place] box.  Use locking tab on box and tape box closed securely.  The [employer] and [employer] has [employer] on it.  Please place in mailbox as soon as possible.  Your physician should have your test results approximately 7 days after the monitor has been mailed back to [facility].   Call [place] at [phone] if you have  questions regarding your ZIO XT patch monitor.  Call them immediately if you see an orange light blinking on your monitor.   If your monitor falls off in less than 4 days contact our Monitor department at [phone].  If your monitor becomes loose or falls off after 4 days call [employer] at [phone] for suggestions on securing your monitor.      Follow-Up: At [facility], you and your health needs are our priority.  As part of our continuing mission to provide you with exceptional heart care, we have created designated Provider Care Teams.  These Care Teams include your primary Cardiologist (physician) and Advanced Practice Providers (APPs -   Physician Assistants and Nurse Practitioners) who all work together to provide you with the care you need, when you need it.  We recommend signing up for the patient portal called "MyChart".  Sign up information is provided on this After Visit Summary.  MyChart is used to connect with patients for Virtual Visits (Telemedicine).  Patients are able to view lab/test results, encounter notes, upcoming appointments, etc.  Non-urgent messages can be sent to your provider as well.   To learn more about what you can do with MyChart, go to [email or web address].    Your next appointment:   6-8 weeks  The format for your next appointment:   Video Visit  Provider:   You may see Gasper Sells, MD or one of the following Advanced Practice Providers on your designated Care Team:    Melina Copa, PA-C  Ermalinda Barrios, PA-C

## 2020-07-07 NOTE — Progress Notes (Signed)
Cardiology Office Note:    Date:  07/07/2020   ID:  Patty [name], DOB 1971/04/05, MRN [id number]  PCP:  Vivi Barrack, MD  [place] Cardiologist:  No primary care provider on file.  [facility] Electrophysiologist:  None   CC: Palpitations Consulted for the evaluation of palpitations at the behest of Vivi Barrack, MD   History of Present Illness:    Patty [name] is a 50 y.o. female with a hx of Palpitations, HLD who presents for evaluation 07/07/20.    Patient notes that she is feeling new palpitations.  Notes that she had a sinus infection at the begging of the year and finished a course of Augmentin.  Developed new palpitations after this.  Notes palpitations all the time (including presently and during 07/05/20 ECG).  Has had no chest pain, chest pressure, chest tightness, chest stinging.  When laying down feels a lot of funny heart beats.  Discomfort occurs with rest,  and improves with exercist.  Patient exertion notable for running for a marathon and lifting weights and feels no symptoms.  No shortness of breath, DOE .  No PND or orthopnea.  No bendopnea, weight gain, leg swelling , or abdominal swelling.  No syncope or near syncope . Notes daily palpitations and funny heart beats.   No new stressors.  Notes that she has a new heart murmur seen at urgent care (for the sinus infection and in follow up).  Notes some dizziness (hasn't eaten today)  Patient reports NO prior cardiac testing including  echo,  stress test,  heart catheterizations,  cardioversion,  ablations.  No history of pre-eclampsia, early menarche, or prematurity.    Past Medical History:  Diagnosis Date  . Anxiety   . History of recurrent UTIs   . Hyperlipidemia   . Palpitations     Past Surgical History:  Procedure Laterality Date  . COLONOSCOPY  15 years ago    in CT-normal  . ENDOMETRIAL ABLATION  10/16/2013  . SEPTOPLASTY      Current Medications: Current Meds  Medication  Sig  . Calcium Citrate-Vitamin D (CALCIUM + D PO) Take by mouth.  . Multiple Vitamin (MULTIVITAMIN) tablet Take 1 tablet by mouth daily.      Allergies:   Patient has no known allergies.   Social History   Socioeconomic History  . Marital status: Married    Spouse name: Not on file  . Number of children: 2  . Years of education: Not on file  . Highest education level: Not on file  Occupational History  . Occupation: Passenger transport manager  Tobacco Use  . Smoking status: Never Smoker  . Smokeless tobacco: Never Used  Vaping Use  . Vaping Use: Never used  Substance and Sexual Activity  . Alcohol use: No    Comment: Social  beer/wine  . Drug use: No  . Sexual activity: Yes    Partners: Male    Birth control/protection: None  Other Topics Concern  . Not on file  Social History Narrative  . Not on file   Social Determinants of Health   Financial Resource Strain: Not on file  Food Insecurity: Not on file  Transportation Needs: Not on file  Physical Activity: Not on file  Stress: Not on file  Social Connections: Not on file     Family History: The patient's family history includes Heart attack in her maternal grandfather and maternal grandmother; Hypertension in her father. There is no history of Colon  cancer, Colon polyps, Esophageal cancer, Rectal cancer, or Stomach cancer. History of coronary artery disease notable for maternal and paternal grandfather and grandmother. History of heart failure notable for no members. No history of cardiomyopathies including hypertrophic cardiomyopathy, left ventricular non-compaction, or arrhythmogenic right ventricular cardiomyopathy. History of arrhythmia notable for no members. Denies family history of sudden cardiac death including drowning, car accidents, or unexplained deaths in the family. No history of bicuspid aortic valve or aortic aneurysm or dissection.   ROS:   Please see the history of present illness.     All other systems  reviewed and are negative.  EKGs/Labs/Other Studies Reviewed:    The following studies were reviewed today:  EKG:   07/05/20:  Sinus Bradycardia rate 53; WNL  Recent Labs: 07/05/2020: ALT 22; BUN 16; Creatinine, Ser 0.70; Hemoglobin 12.4; Platelets 263.0; Potassium 4.0; Sodium 139; TSH 1.35  Recent Lipid Panel    Component Value Date/Time   CHOL 163 06/29/2020 0835   TRIG 69.0 06/29/2020 0835   HDL 77.50 06/29/2020 0835   CHOLHDL 2 06/29/2020 0835   VLDL 13.8 06/29/2020 0835   LDLCALC 71 06/29/2020 0835   LDLCALC 113 (H) 02/10/2020 1011    Risk Assessment/Calculations:     N/A  Physical Exam:    VS:  BP (!) 110/56   Ht 5\' 7"  (1.702 m)   Wt 123 lb (55.8 kg)   BMI 19.26 kg/m     Wt Readings from Last 3 Encounters:  07/07/20 123 lb (55.8 kg)  07/05/20 123 lb (55.8 kg)  04/23/20 119 lb (54 kg)    GEN:  Well nourished, well developed in no acute distress HEENT: Normal NECK: No JVD; No carotid bruits LYMPHATICS: No lymphadenopathy CARDIAC: RRR, no murmurs, rubs, gallops RESPIRATORY:  Clear to auscultation without rales, wheezing or rhonchi  ABDOMEN: Soft, non-tender, non-distended MUSCULOSKELETAL:  No edema; No deformity  SKIN: Warm and dry NEUROLOGIC:  Alert and oriented x 3 PSYCHIATRIC:  Normal affect   ASSESSMENT:    1. Palpitations   2. Heart murmur   3. Mixed hyperlipidemia    PLAN:    In order of problems listed above:  Palpitations; possible SVT - will obtain 14 days non live heart monitor (ZioPatch/[employer]) - will send TSH  Hyperlipidemia  Cardiac Preventive Visit AHA Life's Simple Seven- People with at least five ideal Life's Simple 7 metrics had a 78% reduced risk for heart-related death compared to people with no ideal metrics. - Discussed recommendation of Mediterranean and Dash Diets; discussed the evidence behind vegan diets - Discussed recommendations for 150 minutes weekly exercise - Discussed the important of blood sugar and blood  pressure control - offered CAC scoring and aggressive cholesterol management - stress the important of a smoke free environment - though BMI is not a perfect measurement in all patient populations; a BMI < 30 can improve cardiac outcomes; present BMI is 19  6-8 weeks follow up unless new symptoms or abnormal test results warranting change in plan  Would be reasonable for  Video Visit Follow up Would be reasonable for  APP Follow up      Medication Adjustments/Labs and Tests Ordered: Current medicines are reviewed at length with the patient today.  Concerns regarding medicines are outlined above.  Orders Placed This Encounter  Procedures  . CT CARDIAC SCORING (SELF PAY ONLY)  . TSH  . LONG TERM MONITOR (3-14 DAYS)   No orders of the defined types were placed in this encounter.   Patient Instructions  Medication Instructions:  Your physician recommends that you continue on your current medications as directed. Please refer to the Current Medication list given to you today.  *If you need a refill on your cardiac medications before your next appointment, please call your pharmacy*   Lab Work: TODAY: TSH  If you have labs (blood work) drawn today and your tests are completely normal, you will receive your results only by: Marland Kitchen MyChart Message (if you have MyChart) OR . A paper copy in the mail If you have any lab test that is abnormal or we need to change your treatment, we will call you to review the results.   Testing/Procedures:  Your provider has requested that you have a Cardiac Calcium Score CT.  ZIO XT- Long Term Monitor Instructions   Your physician has requested you wear your ZIO patch monitor__14__days.   This is a single patch monitor.  [employer] supplies one patch monitor per enrollment.  Additional stickers are not available.   Please do not apply patch if you will be having a Nuclear Stress Test, Echocardiogram, Cardiac CT, MRI, or Chest Xray during the time frame  you would be wearing the monitor. The patch cannot be worn during these tests.  You cannot remove and re-apply the ZIO XT patch monitor.   Your ZIO patch monitor will be sent USPS Priority mail from [facility] directly to your home address. The monitor may also be mailed to a PO BOX if home delivery is not available.   It may take 3-5 days to receive your monitor after you have been enrolled.   Once you have received you monitor, please review enclosed instructions.  Your monitor has already been registered assigning a specific monitor serial # to you.   Applying the monitor   Shave hair from upper left chest.   Hold abrader disc by orange tab.  Rub abrader in 40 strokes over left upper chest as indicated in your monitor instructions.   Clean area with 4 enclosed alcohol pads .  Use all pads to assure are is cleaned thoroughly.  Let dry.   Apply patch as indicated in monitor instructions.  Patch will be place under collarbone on left side of chest with arrow pointing upward.   Rub patch adhesive wings for 2 minutes.Remove white label marked "1".  Remove white label marked "2".  Rub patch adhesive wings for 2 additional minutes.   While looking in a mirror, press and release button in center of patch.  A small green light will flash 3-4 times .  This will be your only indicator the monitor has been turned on.     Do not shower for the first 24 hours.  You may shower after the first 24 hours.   Press button if you feel a symptom. You will hear a small click.  Record Date, Time and Symptom in the Patient Log Book.   When you are ready to remove patch, follow instructions on last 2 pages of Patient Log Book.  Stick patch monitor onto last page of Patient Log Book.   Place Patient Log Book in [place] box.  Use locking tab on box and tape box closed securely.  The [employer] has [employer] on it.  Please place in mailbox as soon as possible.  Your physician should have your  test results approximately 7 days after the monitor has been mailed back to [facility].   Call [place] at [phone] if you  have questions regarding your ZIO XT patch monitor.  Call them immediately if you see an orange light blinking on your monitor.   If your monitor falls off in less than 4 days contact our Monitor department at [phone].  If your monitor becomes loose or falls off after 4 days call [employer] at [phone] for suggestions on securing your monitor.      Follow-Up: At [facility], you and your health needs are our priority.  As part of our continuing mission to provide you with exceptional heart care, we have created designated Provider Care Teams.  These Care Teams include your primary Cardiologist (physician) and Advanced Practice Providers (APPs -  Physician Assistants and Nurse Practitioners) who all work together to provide you with the care you need, when you need it.  We recommend signing up for the patient portal called "MyChart".  Sign up information is provided on this After Visit Summary.  MyChart is used to connect with patients for Virtual Visits (Telemedicine).  Patients are able to view lab/test results, encounter notes, upcoming appointments, etc.  Non-urgent messages can be sent to your provider as well.   To learn more about what you can do with MyChart, go to [email or web address].    Your next appointment:   6-8 weeks  The format for your next appointment:   Video Visit  Provider:   You may see Gasper Sells, MD or one of the following Advanced Practice Providers on your designated Care Team:    Melina Copa, PA-C  Ermalinda Barrios, PA-C          Signed, Werner Lean, MD  07/07/2020 9:07 AM    [place]

## 2020-07-07 NOTE — Progress Notes (Signed)
Patient ID: Patty [name], female   DOB: 01-19-1971, 50 y.o.   MRN: [id number] Patient enrolled for 14 day ZIO XT long term holter monitor to be mailed to her home.

## 2020-07-08 ENCOUNTER — Telehealth: Payer: Self-pay

## 2020-07-08 DIAGNOSIS — R002 Palpitations: Secondary | ICD-10-CM

## 2020-07-08 NOTE — Telephone Encounter (Signed)
Ok to place order for CMET with magnesium.  Algis Greenhouse. Jerline Pain, MD 07/08/2020 12:36 PM

## 2020-07-08 NOTE — Telephone Encounter (Signed)
With what diagnosis to associate with?

## 2020-07-08 NOTE — Telephone Encounter (Signed)
Pt is requesting to have an electrolyte panel done and her magnesium checked.

## 2020-07-08 NOTE — Telephone Encounter (Signed)
Labs placed, ok to schedule lab visit.

## 2020-07-08 NOTE — Addendum Note (Signed)
Addended by: Clyde Lundborg A on: 07/08/2020 04:16 PM   Modules accepted: Orders

## 2020-07-08 NOTE — Telephone Encounter (Signed)
See below

## 2020-07-08 NOTE — Telephone Encounter (Signed)
Ok to associate with palpitations.  Patty [name]. Jerline Pain, MD 07/08/2020 1:02 PM

## 2020-07-09 ENCOUNTER — Other Ambulatory Visit (INDEPENDENT_AMBULATORY_CARE_PROVIDER_SITE_OTHER): Payer: 59

## 2020-07-09 ENCOUNTER — Encounter: Payer: Self-pay | Admitting: Family Medicine

## 2020-07-09 ENCOUNTER — Other Ambulatory Visit: Payer: Self-pay

## 2020-07-09 DIAGNOSIS — R002 Palpitations: Secondary | ICD-10-CM

## 2020-07-09 LAB — COMPREHENSIVE METABOLIC PANEL
ALT: 20 U/L (ref 0–35)
AST: 22 U/L (ref 0–37)
Albumin: 4.6 g/dL (ref 3.5–5.2)
Alkaline Phosphatase: 52 U/L (ref 39–117)
BUN: 16 mg/dL (ref 6–23)
CO2: 33 mEq/L — ABNORMAL HIGH (ref 19–32)
Calcium: 9.5 mg/dL (ref 8.4–10.5)
Chloride: 102 mEq/L (ref 96–112)
Creatinine, Ser: 0.76 mg/dL (ref 0.40–1.20)
GFR: 91.07 mL/min (ref >60.00)
Glucose, Bld: 80 mg/dL (ref 70–99)
Potassium: 3.8 mEq/L (ref 3.5–5.1)
Sodium: 139 mEq/L (ref 135–145)
Total Bilirubin: 0.8 mg/dL (ref 0.2–1.2)
Total Protein: 7.4 g/dL (ref 6.0–8.3)

## 2020-07-09 LAB — MAGNESIUM: Magnesium: 2.1 mg/dL (ref 1.5–2.5)

## 2020-07-09 NOTE — Telephone Encounter (Signed)
Pt is scheduled °

## 2020-07-12 NOTE — Progress Notes (Signed)
Please inform patient of the following:  Labs are all within expected ranges.  Algis Greenhouse. Jerline Pain, MD 07/12/2020 8:42 AM

## 2020-07-13 ENCOUNTER — Telehealth: Payer: Self-pay | Admitting: Internal Medicine

## 2020-07-13 DIAGNOSIS — R011 Cardiac murmur, unspecified: Secondary | ICD-10-CM

## 2020-07-13 NOTE — Addendum Note (Signed)
Addended by: Para March on: 07/13/2020 09:10 AM   Modules accepted: Orders

## 2020-07-13 NOTE — Telephone Encounter (Signed)
Patient called to discuss orders for recommended echo per Dr. Gasper Sells. Patient in agreement to proceed with echo. Orders placed for scheduling. Patient verbalized understanding.

## 2020-07-13 NOTE — Telephone Encounter (Signed)
New Message:     Pt wants to find out from Dr Gasper Sells if it still alright for her to continue training forher Marathon with what she have going on with the Palpitations. She also would like for him to order for an ultrasound orf xray of her chest.

## 2020-07-13 NOTE — Telephone Encounter (Signed)
Reasonable to continue her training:  We are trying to find out if her symptoms are heart related and we should see what her heart looks like for her daily activities.  She had a heart murmur with her PCP, but not at our visit.  Echocardiogram for indication Heart Murmur is reasonable. Will order echocardiogram.  Do not have a clinical reason to our a Chest X-Ray at this time.  Will defer ordering an X ray.  Thanks,  Werner Lean, MD

## 2020-07-15 NOTE — Telephone Encounter (Signed)
I have done the PA it was denied through her pharmacy benefits now I have to call and do a PA through the medical benefits I will work on that as soon as I am caught up on referrals for today. - CF

## 2020-07-16 ENCOUNTER — Other Ambulatory Visit: Payer: Self-pay

## 2020-07-16 ENCOUNTER — Ambulatory Visit (HOSPITAL_COMMUNITY)
Admission: RE | Admit: 2020-07-16 | Discharge: 2020-07-16 | Disposition: A | Discharge status: Home / Self Care | Payer: 59 | Source: Ambulatory Visit | Attending: Internal Medicine | Admitting: Internal Medicine

## 2020-07-16 DIAGNOSIS — R002 Palpitations: Secondary | ICD-10-CM | POA: Insufficient documentation

## 2020-07-16 DIAGNOSIS — R011 Cardiac murmur, unspecified: Secondary | ICD-10-CM | POA: Insufficient documentation

## 2020-07-16 DIAGNOSIS — E785 Hyperlipidemia, unspecified: Secondary | ICD-10-CM | POA: Diagnosis not present

## 2020-07-16 LAB — ECHOCARDIOGRAM COMPLETE
Area-P 1/2: 3.6 cm2
Calc EF: 56.2 %
S' Lateral: 3.4 cm
Single Plane A2C EF: 58.8 %
Single Plane A4C EF: 53.9 %

## 2020-07-16 NOTE — Progress Notes (Signed)
  Echocardiogram 2D Echocardiogram has been performed.  Patty [name] 07/16/2020, 8:44 AM

## 2020-07-20 MED ORDER — EUFLEXXA 20 MG/2ML IX SOSY: 1.0000 | PREFILLED_SYRINGE | INTRA_ARTICULAR | 3 refills | Status: DC

## 2020-07-20 NOTE — Telephone Encounter (Signed)
Done

## 2020-07-20 NOTE — Addendum Note (Signed)
Addended by: Silverio Decamp on: 07/20/2020 04:46 PM   Modules accepted: Orders

## 2020-07-20 NOTE — Telephone Encounter (Signed)
Dr. Darene Lamer   I got Euflexxa approved for Patty [name] it is the preferred please send this medication to her specialty pharmacy at   [place] [phone]  Authorization: [id number] Valid: 07/20/2020 - 01/17/2021

## 2020-07-21 ENCOUNTER — Other Ambulatory Visit: Payer: Self-pay

## 2020-07-21 ENCOUNTER — Ambulatory Visit (INDEPENDENT_AMBULATORY_CARE_PROVIDER_SITE_OTHER)
Admission: RE | Admit: 2020-07-21 | Discharge: 2020-07-21 | Disposition: A | Discharge status: Home / Self Care | Payer: Self-pay | Source: Ambulatory Visit | Attending: Internal Medicine | Admitting: Internal Medicine

## 2020-07-21 DIAGNOSIS — R002 Palpitations: Secondary | ICD-10-CM

## 2020-07-27 ENCOUNTER — Other Ambulatory Visit: Payer: Self-pay | Admitting: Family Medicine

## 2020-07-27 DIAGNOSIS — R519 Headache, unspecified: Secondary | ICD-10-CM

## 2020-07-28 ENCOUNTER — Other Ambulatory Visit: Payer: Self-pay

## 2020-07-28 ENCOUNTER — Ambulatory Visit
Admission: RE | Admit: 2020-07-28 | Discharge: 2020-07-28 | Disposition: A | Discharge status: Home / Self Care | Payer: 59 | Source: Ambulatory Visit | Attending: Family Medicine | Admitting: Family Medicine

## 2020-07-28 DIAGNOSIS — R519 Headache, unspecified: Secondary | ICD-10-CM

## 2020-08-05 NOTE — Telephone Encounter (Signed)
Patty [name]   It is correct every time I have spoken with Goldie she has stated that she only wants one knee injected at this time. If you will please have Dr. Darene Lamer correct the order to only have Have one box of the Euflexxa shipped as patient has asked.   Thank you Jenny Reichmann

## 2020-08-05 NOTE — Telephone Encounter (Signed)
Patient left a VM stating that she wanted the prescription change to reflect only one box should be shipped. LM for patient to return call to office for either [facility] or myself that she is only going to have one knee injected.  Once we have this confirmation, we can change the prescription.

## 2020-08-06 MED ORDER — EUFLEXXA 20 MG/2ML IX SOSY
1.0000 | PREFILLED_SYRINGE | INTRA_ARTICULAR | 3 refills | Status: DC
Start: 2020-08-06 — End: 2020-11-17

## 2020-08-06 NOTE — Telephone Encounter (Signed)
Updated prescription sent to the specialty pharmacy.

## 2020-08-06 NOTE — Addendum Note (Signed)
Addended by: Silverio Decamp on: 08/06/2020 11:50 AM   Modules accepted: Orders

## 2020-08-12 ENCOUNTER — Other Ambulatory Visit: Payer: Self-pay

## 2020-08-12 ENCOUNTER — Encounter: Payer: Self-pay | Admitting: Internal Medicine

## 2020-08-12 ENCOUNTER — Ambulatory Visit: Payer: 59 | Admitting: Internal Medicine

## 2020-08-12 VITALS — BP 100/58 | HR 54 | Ht 67.5 in | Wt 124.4 lb

## 2020-08-12 DIAGNOSIS — I491 Atrial premature depolarization: Secondary | ICD-10-CM

## 2020-08-12 DIAGNOSIS — E782 Mixed hyperlipidemia: Secondary | ICD-10-CM | POA: Diagnosis not present

## 2020-08-12 NOTE — Progress Notes (Signed)
Cardiology Office Note:    Date:  08/12/2020   ID:  Patty [name], DOB June 23, 1970, MRN [id number]  PCP:  Vivi Barrack, MD  [facility] Cardiologist:  Rudean Haskell MD [place] Electrophysiologist:  None   CC: Palpitations f/u  History of Present Illness:    Patty [name] is a 50 y.o. female with a hx of Palpitations, HLD who presents for evaluation 07/07/20.  In interim of this visit, patient had normal Echo, Zio, and CAC. Seen 08/12/20.  Patient notes that she is doing well.  Since last visit notes  Stopping the Lipitor and other medications briefly to see if her palpitations were a medication reaction.  Was doing some running and speed work during.  There are no interval hospital/ED visit.    Notes some burning pain on the left and right side.  Has felt it after her weights.  No SOB.  No change in amount of palpitations but no syncope .   Past Medical History:  Diagnosis Date  . Anxiety   . History of recurrent UTIs   . Hyperlipidemia   . Palpitations     Past Surgical History:  Procedure Laterality Date  . COLONOSCOPY  15 years ago    in CT-normal  . ENDOMETRIAL ABLATION  10/16/2013  . SEPTOPLASTY      Current Medications: Current Meds  Medication Sig  . Multiple Vitamin (MULTIVITAMIN) tablet Take 1 tablet by mouth daily.   . Sodium Hyaluronate (EUFLEXXA) 20 MG/2ML SOSY Inject 1 each into the articular space once a week. Into ONE knee  x3 weeks.  Dx Knee osteoarthritis  . [DISCONTINUED] atorvastatin (LIPITOR) 10 MG tablet Take 1 tablet (10 mg total) by mouth daily.     Allergies:   Patient has no known allergies.   Social History   Socioeconomic History  . Marital status: Married    Spouse name: Not on file  . Number of children: 2  . Years of education: Not on file  . Highest education level: Not on file  Occupational History  . Occupation: Passenger transport manager  Tobacco Use  . Smoking status: Never Smoker  . Smokeless tobacco:  Never Used  Vaping Use  . Vaping Use: Never used  Substance and Sexual Activity  . Alcohol use: No    Comment: Social  beer/wine  . Drug use: No  . Sexual activity: Yes    Partners: Male    Birth control/protection: None  Other Topics Concern  . Not on file  Social History Narrative  . Not on file   Social Determinants of Health   Financial Resource Strain: Not on file  Food Insecurity: Not on file  Transportation Needs: Not on file  Physical Activity: Not on file  Stress: Not on file  Social Connections: Not on file    Social:  Ihor Austin going to the [employer]  Family History: The patient's family history includes Heart attack in her maternal grandfather and maternal grandmother; Hypertension in her father. There is no history of Colon cancer, Colon polyps, Esophageal cancer, Rectal cancer, or Stomach cancer. History of coronary artery disease notable for maternal and paternal grandfather and grandmother. History of heart failure notable for no members. No history of cardiomyopathies including hypertrophic cardiomyopathy, left ventricular non-compaction, or arrhythmogenic right ventricular cardiomyopathy. History of arrhythmia notable for no members. Denies family history of sudden cardiac death including drowning, car accidents, or unexplained deaths in the family. No history of bicuspid aortic valve or aortic aneurysm  or dissection.   ROS:   Please see the history of present illness.     All other systems reviewed and are negative.  EKGs/Labs/Other Studies Reviewed:    The following studies were reviewed today:  EKG:   07/05/20:  Sinus Bradycardia rate 53; WNL  Cardiac Event Monitoring: Date: 08/04/20 Results:  Patient had a minimum heart rate of 44 bpm, maximum heart rate of 182 bpm, and average heart rate of 70 bpm.  Predominant underlying rhythm was sinus rhythm.  Two runs of supraventricular tachycardia occurred lasting 6 beats at longest with a  max rate of 136 bpm at fastest.  Isolated PACs were rare (<1.0%), with rare couplets and triplets present.  Isolated PVCs were rare (<1.0%), with rare couplets present.  No evidence of complete heart block.  Triggered and diary events associated with sinus rhythm (algorithrm also reads for SVE but there is no post extra-systolic pause).   No malignant arrhythmias.   Transthoracic Echocardiogram: Date:07/16/20 Results: 1. Left ventricular ejection fraction, by estimation, is 55 to 60%. The  left ventricle has normal function. The left ventricle has no regional  wall motion abnormalities. Left ventricular diastolic parameters were  normal.  2. Right ventricular systolic function is normal. The right ventricular  size is normal. There is normal pulmonary artery systolic pressure.  3. The mitral valve is normal in structure. Trivial mitral valve  regurgitation. No evidence of mitral stenosis.  4. The aortic valve is normal in structure. Aortic valve regurgitation is  not visualized. No aortic stenosis is present.  5. The inferior vena cava is normal in size with greater than 50%  respiratory variability, suggesting right atrial pressure of 3 mmHg.   CAC Scan: Date: 07/21/20 Results: IMPRESSION: 1. Coronary calcium score of 0. This was 1st percentile for age, gender, and race matched controls.   Recent Labs: 07/05/2020: Hemoglobin 12.4; Platelets 263.0 07/07/2020: TSH 2.010 07/09/2020: ALT 20; BUN 16; Creatinine, Ser 0.76; Magnesium 2.1; Potassium 3.8; Sodium 139  Recent Lipid Panel    Component Value Date/Time   CHOL 163 06/29/2020 0835   TRIG 69.0 06/29/2020 0835   HDL 77.50 06/29/2020 0835   CHOLHDL 2 06/29/2020 0835   VLDL 13.8 06/29/2020 0835   LDLCALC 71 06/29/2020 0835   LDLCALC 113 (H) 02/10/2020 1011    Risk Assessment/Calculations:     N/A  Physical Exam:    VS:  BP (!) 100/58   Pulse (!) 54   Ht 5' 7.5" (1.715 m)   Wt 124 lb 6.4 oz (56.4 kg)   SpO2  99%   BMI 19.20 kg/m     Wt Readings from Last 3 Encounters:  08/12/20 124 lb 6.4 oz (56.4 kg)  07/07/20 123 lb (55.8 kg)  07/05/20 123 lb (55.8 kg)    GEN:  Well nourished, well developed in no acute distress HEENT: Normal NECK: No JVD; No carotid bruits LYMPHATICS: No lymphadenopathy CARDIAC: RRR, no murmurs, rubs, gallops RESPIRATORY:  Clear to auscultation without rales, wheezing or rhonchi  ABDOMEN: Soft, non-tender, non-distended MUSCULOSKELETAL:  No edema; No deformity  SKIN: Warm and dry NEUROLOGIC:  Alert and oriented x 3 PSYCHIATRIC:  Normal affect   ASSESSMENT:    1. Mixed hyperlipidemia   2. PAC (premature atrial contraction)    PLAN:    In order of problems listed above:  Hyperlipidemia  PACs Cardiac Preventive Visit AHA Life's Simple Seven- People with at least five ideal Life's Simple 7 metrics had a 78% reduced risk for heart-related  death compared to people with no ideal metrics. - Discussed recommendation of Mediterranean and Dash Diets; discussed the evidence behind vegan diets - Discussed recommendations for 150 minutes weekly exercise - Discussed the important of blood sugar and blood pressure control - stress the important of a smoke free environment - though BMI is not a perfect measurement in all patient populations; a BMI < 30 can improve cardiac outcomes; present BMI is 19 - SHARED DECISION MAKING: trial of the cholesterol medication and repeat (Mid May) fasting.  Will not treat PACs medically   May- June follow up unless new symptoms or abnormal test results warranting change in plan  Would be reasonable for  Video Visit Follow up Would be reasonable for  APP Follow up  Time Spent Directly with Patient:   I have spent a total of 40 minutes with the patient reviewing  notes, heart monitor and CT Scan with patient and examining the patient as well as establishing an assessment and plan that was discussed personally with the patient.  > 50% of  time was spent in direct patient care.   Medication Adjustments/Labs and Tests Ordered: Current medicines are reviewed at length with the patient today.  Concerns regarding medicines are outlined above.  Orders Placed This Encounter  Procedures  . Lipid panel   No orders of the defined types were placed in this encounter.   Patient Instructions  Medication Instructions:  Your physician has recommended you make the following change in your medication:  TRIAL: Stop atorvastatin  *If you need a refill on your cardiac medications before your next appointment, please call your pharmacy*   Lab Work: IN MID MAY: Fasting lipid panel If you have labs (blood work) drawn today and your tests are completely normal, you will receive your results only by: Marland Kitchen MyChart Message (if you have MyChart) OR . A paper copy in the mail If you have any lab test that is abnormal or we need to change your treatment, we will call you to review the results.   Testing/Procedures: NONE   Follow-Up: At [facility], you and your health needs are our priority.  As part of our continuing mission to provide you with exceptional heart care, we have created designated Provider Care Teams.  These Care Teams include your primary Cardiologist (physician) and Advanced Practice Providers (APPs -  Physician Assistants and Nurse Practitioners) who all work together to provide you with the care you need, when you need it.   Your next appointment:   3 month(s)  The format for your next appointment:   In Person  Provider:   You may see Gasper Sells, MD or one of the following Advanced Practice Providers on your designated Care Team:    Melina Copa, PA-C  Ermalinda Barrios, PA-C         Signed, Werner Lean, MD  08/12/2020 3:00 PM    [place]

## 2020-08-12 NOTE — Patient Instructions (Signed)
Medication Instructions:  Your physician has recommended you make the following change in your medication:  TRIAL: Stop atorvastatin  *If you need a refill on your cardiac medications before your next appointment, please call your pharmacy*   Lab Work: IN MID MAY: Fasting lipid panel If you have labs (blood work) drawn today and your tests are completely normal, you will receive your results only by: Marland Kitchen MyChart Message (if you have MyChart) OR . A paper copy in the mail If you have any lab test that is abnormal or we need to change your treatment, we will call you to review the results.   Testing/Procedures: NONE   Follow-Up: At [facility], you and your health needs are our priority.  As part of our continuing mission to provide you with exceptional heart care, we have created designated Provider Care Teams.  These Care Teams include your primary Cardiologist (physician) and Advanced Practice Providers (APPs -  Physician Assistants and Nurse Practitioners) who all work together to provide you with the care you need, when you need it.   Your next appointment:   3 month(s)  The format for your next appointment:   In Person  Provider:   You may see Gasper Sells, MD or one of the following Advanced Practice Providers on your designated Care Team:    Melina Copa, PA-C  Ermalinda Barrios, PA-C

## 2020-08-25 ENCOUNTER — Telehealth: Payer: 59 | Admitting: Internal Medicine

## 2020-08-30 ENCOUNTER — Other Ambulatory Visit: Payer: Self-pay

## 2020-08-30 ENCOUNTER — Ambulatory Visit: Payer: 59 | Admitting: Sports Medicine

## 2020-08-30 ENCOUNTER — Ambulatory Visit (INDEPENDENT_AMBULATORY_CARE_PROVIDER_SITE_OTHER): Payer: 59

## 2020-08-30 DIAGNOSIS — M170 Bilateral primary osteoarthritis of knee: Secondary | ICD-10-CM

## 2020-08-30 NOTE — Progress Notes (Signed)
    Procedures performed today:    Procedure: Real-time Ultrasound Guided injection of the left knee Device: Samsung HS60  Verbal informed consent obtained.  Time-out conducted.  Noted no overlying erythema, induration, or other signs of local infection.  Skin prepped in a sterile fashion.  Local anesthesia: Topical Ethyl chloride.  With sterile technique and under real time ultrasound guidance:  No effusion noted, 1 syringe of Euflexxa injected through a 22-gauge needle into the suprapatellar recess.  Completed without difficulty  Advised to call if fevers/chills, erythema, induration, drainage, or persistent bleeding.  Images permanently stored and available for review in PACS.  Impression: Technically successful ultrasound guided injection.  Independent interpretation of notes and tests performed by another provider:   None.  Brief History, Exam, Impression, and Recommendations:    Primary osteoarthritis of both knees Euflexxa injection #1 of 3 into the left knee, return in 1 week for #2 of 3. She does have the [place] marathon coming up on April 28, we will be done 1 week prior.    ___________________________________________ Gwen Her. Dianah Field, M.D., ABFM., CAQSM. Primary Care and [place] Instructor of [place] of [facility] of Medicine

## 2020-08-30 NOTE — Assessment & Plan Note (Addendum)
Euflexxa injection #1 of 3 into the left knee, return in 1 week for #2 of 3. She does have the [place] marathon coming up on April 28, we will be done 1 week prior.

## 2020-09-06 ENCOUNTER — Ambulatory Visit: Payer: Self-pay

## 2020-09-06 ENCOUNTER — Ambulatory Visit (INDEPENDENT_AMBULATORY_CARE_PROVIDER_SITE_OTHER): Payer: 59

## 2020-09-06 ENCOUNTER — Ambulatory Visit: Payer: 59 | Admitting: Sports Medicine

## 2020-09-06 DIAGNOSIS — I491 Atrial premature depolarization: Secondary | ICD-10-CM | POA: Diagnosis not present

## 2020-09-06 DIAGNOSIS — M170 Bilateral primary osteoarthritis of knee: Secondary | ICD-10-CM | POA: Diagnosis not present

## 2020-09-06 NOTE — Assessment & Plan Note (Signed)
This is a pleasant 50 year old female here for Euflexxa No. 2 of 3, she requests that we put it in her right knee today instead of her left knee, left knee is feeling good. [address] [place] is coming up in April 2018. The next injection, last injection will be into the left knee.

## 2020-09-06 NOTE — Assessment & Plan Note (Signed)
Patty [name] wanted to discuss her PACs, it sounds like she had Holter monitoring, labs, echocardiogram, coronary CT scoring, I explained to her that PACs were not life-threatening albeit uncomfortable and sometimes debilitating. The treatment, beta-blockers would likely decrease her exercise tolerance and worsen her times in the marathon. She agrees to watch it for now, reassurance provided.

## 2020-09-06 NOTE — Progress Notes (Signed)
    Procedures performed today:    Procedure: Real-time Ultrasound Guidedinjection of the right knee Device: Samsung HS60  Verbal informed consent obtained.  Time-out conducted.  Noted no overlying erythema, induration, or other signs of local infection.  Skin prepped in a sterile fashion.  Local anesthesia: Topical Ethyl chloride.  With sterile technique and under real time ultrasound guidance: Trace effusion noted, 1 syringe of Euflexxa injected through a 22-gauge needle into the suprapatellar recess.  Completed without difficulty  Advised to call if fevers/chills, erythema, induration, drainage, or persistent bleeding.  Images permanently stored and available for review in PACS.  Impression: Technically successful ultrasound guided injection.  Independent interpretation of notes and tests performed by another provider:   None.  Brief History, Exam, Impression, and Recommendations:    Primary osteoarthritis of both knees This is a pleasant 50 year old female here for Euflexxa No. 2 of 3, she requests that we put it in her right knee today instead of her left knee, left knee is feeling good. [address] [place] is coming up in April 2018. The next injection, last injection will be into the left knee.  PAC (premature atrial contraction) Christen wanted to discuss her PACs, it sounds like she had Holter monitoring, labs, echocardiogram, coronary CT scoring, I explained to her that PACs were not life-threatening albeit uncomfortable and sometimes debilitating. The treatment, beta-blockers would likely decrease her exercise tolerance and worsen her times in the marathon. She agrees to watch it for now, reassurance provided.    ___________________________________________ Gwen Her. Dianah Field, M.D., ABFM., CAQSM. Primary Care and [place] Instructor of [place] of [facility] of Medicine

## 2020-09-13 ENCOUNTER — Ambulatory Visit: Payer: 59 | Admitting: Sports Medicine

## 2020-09-13 ENCOUNTER — Ambulatory Visit (INDEPENDENT_AMBULATORY_CARE_PROVIDER_SITE_OTHER): Payer: 59

## 2020-09-13 ENCOUNTER — Other Ambulatory Visit: Payer: Self-pay

## 2020-09-13 DIAGNOSIS — M170 Bilateral primary osteoarthritis of knee: Secondary | ICD-10-CM

## 2020-09-13 NOTE — Assessment & Plan Note (Signed)
Patty [name] returns, she is a pleasant 49 year old female runner, today we did Euflexxa injection #3 of 3, we have been injecting her left knee, at the last visit she requested #2 of 3 placed in her right knee, today we placed the last injection in her left knee so she has had 2 in the left and 1 in the right. She does have the Patty [name] marathon coming up next week. Return as needed.

## 2020-09-13 NOTE — Progress Notes (Signed)
    Procedures performed today:    Procedure: Real-time Ultrasound Guidedinjection of the left knee Device: Samsung HS60  Verbal informed consent obtained.  Time-out conducted.  Noted no overlying erythema, induration, or other signs of local infection.  Skin prepped in a sterile fashion.  Local anesthesia: Topical Ethyl chloride.  With sterile technique and under real time ultrasound guidance:Trace effusion noted, 1 syringe of Euflexxa injected through a 22-gauge needle into the suprapatellar recess.  Completed without difficulty  Advised to call if fevers/chills, erythema, induration, drainage, or persistent bleeding.  Images permanently stored and available for review in PACS.  Impression: Technically successful ultrasound guided injection.  Independent interpretation of notes and tests performed by another provider:   None.  Brief History, Exam, Impression, and Recommendations:    Primary osteoarthritis of both knees Patty [name] returns, she is a pleasant 50 year old female runner, today we did Euflexxa injection #3 of 3, we have been injecting her left knee, at the last visit she requested #2 of 3 placed in her right knee, today we placed the last injection in her left knee so she has had 2 in the left and 1 in the right. She does have the [place] marathon coming up next week. Return as needed.    ___________________________________________ Gwen Her. Dianah Field, M.D., ABFM., CAQSM. Primary Care and [place] Instructor of [place] of [facility] of Medicine

## 2020-10-20 ENCOUNTER — Other Ambulatory Visit: Payer: 59 | Admitting: *Deleted

## 2020-10-20 ENCOUNTER — Other Ambulatory Visit: Payer: Self-pay

## 2020-10-20 LAB — LIPID PANEL
Chol/HDL Ratio: 2.4 ratio (ref 0.0–4.4)
Cholesterol, Total: 216 mg/dL — ABNORMAL HIGH (ref 100–199)
HDL: 89 mg/dL (ref >39)
LDL Chol Calc (NIH): 115 mg/dL — ABNORMAL HIGH (ref 0–99)
Triglycerides: 68 mg/dL (ref 0–149)
VLDL Cholesterol Cal: 12 mg/dL (ref 5–40)

## 2020-10-25 ENCOUNTER — Telehealth: Payer: Self-pay

## 2020-10-25 MED ORDER — ATORVASTATIN CALCIUM 10 MG PO TABS: 10.0000 mg | ORAL_TABLET | Freq: Every day | ORAL | 3 refills | Status: DC

## 2020-10-25 NOTE — Telephone Encounter (Signed)
Patient has seen results and MD recommendations released through My Chart.

## 2020-10-25 NOTE — Telephone Encounter (Signed)
-----   Message from Werner Lean, MD sent at 10/23/2020 12:05 PM EDT ----- Results: Worsened cholesterol on trial off of atorvastatin 10 mg PO Daily Plan: Return to atorvastatin 10 mg PO Daily  Werner Lean, MD

## 2020-11-17 ENCOUNTER — Other Ambulatory Visit: Payer: Self-pay

## 2020-11-17 ENCOUNTER — Encounter: Payer: Self-pay | Admitting: Internal Medicine

## 2020-11-17 ENCOUNTER — Ambulatory Visit: Payer: 59 | Admitting: Internal Medicine

## 2020-11-17 VITALS — BP 100/58 | HR 55 | Ht 67.5 in | Wt 123.0 lb

## 2020-11-17 DIAGNOSIS — E782 Mixed hyperlipidemia: Secondary | ICD-10-CM

## 2020-11-17 DIAGNOSIS — I491 Atrial premature depolarization: Secondary | ICD-10-CM | POA: Diagnosis not present

## 2020-11-17 MED ORDER — ROSUVASTATIN CALCIUM 5 MG PO TABS: 5.0000 mg | ORAL_TABLET | Freq: Every day | ORAL | 0 refills | Status: DC

## 2020-11-17 NOTE — Progress Notes (Signed)
Cardiology Office Note:    Date:  11/17/2020   ID:  Patty [name], DOB Oct 20, 1970, MRN [id number]  PCP:  Vivi Barrack, MD  [facility] Cardiologist:  Rudean Haskell MD [place] Electrophysiologist:  None   CC: Palpitations f/u  History of Present Illness:    Patty [name] is a 50 y.o. female with a hx of Palpitations, HLD who presents for evaluation 07/07/20.  In interim of this visit, patient had normal Echo, Zio, and CAC.Seen 08/12/20.  In interim of this visit, patient ran the [facility].  Patient notes that she is doing great.  Since last visit ran the [facility] and had no issues.  Relevant interval testing or therapy include LDL elevated off of statin.  There are no interval hospital/ED visit.  Patient notes that coming back on atorvastatin had palpitations.  No chest pain or pressure .  No SOB/DOE and no PND/Orthopnea.  No weight gain or leg swelling.  Notes palpitations on atorvastatin.  Past Medical History:  Diagnosis Date   Anxiety    History of recurrent UTIs    Hyperlipidemia    Palpitations     Past Surgical History:  Procedure Laterality Date   COLONOSCOPY  15 years ago    in CT-normal   ENDOMETRIAL ABLATION  10/16/2013   SEPTOPLASTY      Current Medications: Current Meds  Medication Sig   atorvastatin (LIPITOR) 10 MG tablet Take 1 tablet (10 mg total) by mouth daily.   Multiple Vitamin (MULTIVITAMIN) tablet Take 1 tablet by mouth daily.      Allergies:   Patient has no known allergies.   Social History   Socioeconomic History   Marital status: Married    Spouse name: Not on file   Number of children: 2   Years of education: Not on file   Highest education level: Not on file  Occupational History   Occupation: Passenger transport manager  Tobacco Use   Smoking status: Never   Smokeless tobacco: Never  Vaping Use   Vaping Use: Never used  Substance and Sexual Activity   Alcohol use: No    Comment: Social  beer/wine    Drug use: No   Sexual activity: Yes    Partners: Male    Birth control/protection: None  Other Topics Concern   Not on file  Social History Narrative   Not on file   Social Determinants of Health   Financial Resource Strain: Not on file  Food Insecurity: Not on file  Transportation Needs: Not on file  Physical Activity: Not on file  Stress: Not on file  Social Connections: Not on file    Social:  Ihor Austin ran  to the [employer] in 2022  Family History: The patient's family history includes Heart attack in her maternal grandfather and maternal grandmother; Hypertension in her father. There is no history of Colon cancer, Colon polyps, Esophageal cancer, Rectal cancer, or Stomach cancer. History of coronary artery disease notable for maternal and paternal grandfather and grandmother. History of heart failure notable for no members. No history of cardiomyopathies including hypertrophic cardiomyopathy, left ventricular non-compaction, or arrhythmogenic right ventricular cardiomyopathy. History of arrhythmia notable for no members. Denies family history of sudden cardiac death including drowning, car accidents, or unexplained deaths in the family. No history of bicuspid aortic valve or aortic aneurysm or dissection.   ROS:   Please see the history of present illness.     All other systems reviewed and are negative.  EKGs/Labs/Other Studies Reviewed:    The following studies were reviewed today:  EKG:   07/05/20:  Sinus Bradycardia rate 53; WNL  Cardiac Event Monitoring: Date: 08/04/20 Results: Patient had a minimum heart rate of 44 bpm, maximum heart rate of 182 bpm, and average heart rate of 70 bpm. Predominant underlying rhythm was sinus rhythm. Two runs of supraventricular tachycardia occurred lasting 6 beats at longest with a max rate of 136 bpm at fastest. Isolated PACs were rare (<1.0%), with rare couplets and triplets present. Isolated PVCs were rare (<1.0%),  with rare couplets present. No evidence of complete heart block. Triggered and diary events associated with sinus rhythm (algorithrm also reads for SVE but there is no post extra-systolic pause).   No malignant arrhythmias.   Transthoracic Echocardiogram: Date:07/16/20 Results: 1. Left ventricular ejection fraction, by estimation, is 55 to 60%. The  left ventricle has normal function. The left ventricle has no regional  wall motion abnormalities. Left ventricular diastolic parameters were  normal.   2. Right ventricular systolic function is normal. The right ventricular  size is normal. There is normal pulmonary artery systolic pressure.   3. The mitral valve is normal in structure. Trivial mitral valve  regurgitation. No evidence of mitral stenosis.   4. The aortic valve is normal in structure. Aortic valve regurgitation is  not visualized. No aortic stenosis is present.   5. The inferior vena cava is normal in size with greater than 50%  respiratory variability, suggesting right atrial pressure of 3 mmHg.   CAC Scan: Date: 07/21/20 Results: IMPRESSION: 1. Coronary calcium score of 0. This was 1st percentile for age, gender, and race matched controls.    Recent Labs: 07/05/2020: Hemoglobin 12.4; Platelets 263.0 07/07/2020: TSH 2.010 07/09/2020: ALT 20; BUN 16; Creatinine, Ser 0.76; Magnesium 2.1; Potassium 3.8; Sodium 139  Recent Lipid Panel    Component Value Date/Time   CHOL 216 (H) 10/20/2020 0955   TRIG 68 10/20/2020 0955   HDL 89 10/20/2020 0955   CHOLHDL 2.4 10/20/2020 0955   CHOLHDL 2 06/29/2020 0835   VLDL 13.8 06/29/2020 0835   LDLCALC 115 (H) 10/20/2020 0955   LDLCALC 113 (H) 02/10/2020 1011    Risk Assessment/Calculations:     N/A  Physical Exam:    VS:  BP (!) 100/58   Pulse (!) 55   Ht 5' 7.5" (1.715 m)   Wt 55.8 kg   SpO2 98%   BMI 18.98 kg/m     Wt Readings from Last 3 Encounters:  11/17/20 55.8 kg  08/12/20 56.4 kg  07/07/20 55.8 kg    GEN:   Well nourished, well developed in no acute distress HEENT: Normal NECK: No JVD; No carotid bruits LYMPHATICS: No lymphadenopathy CARDIAC: RRR, no murmurs, rubs, gallops RESPIRATORY:  Clear to auscultation without rales, wheezing or rhonchi  ABDOMEN: Soft, non-tender, non-distended MUSCULOSKELETAL:  No edema; No deformity  SKIN: Warm and dry NEUROLOGIC:  Alert and oriented x 3 PSYCHIATRIC:  Normal affect   ASSESSMENT:    1. PAC (premature atrial contraction)   2. Mixed hyperlipidemia     PLAN:    In order of problems listed above:  Hyperlipidemia with SE to medications PACs - had palpitations on atorvastatin, stop and start rosuvastatin 5 mg PO daily (could be Zetia or Nexletol patient in the future) - LDL goal is < 100; repeat lipids in three months - continuing low fat diet - Discussed recommendations for 150 minutes weekly exercise - Will not treat PACs medically  One year follow up unless new symptoms or abnormal test results warranting change in plan      Medication Adjustments/Labs and Tests Ordered: Current medicines are reviewed at length with the patient today.  Concerns regarding medicines are outlined above.  No orders of the defined types were placed in this encounter.  No orders of the defined types were placed in this encounter.   There are no Patient Instructions on file for this visit.   Signed, Werner Lean, MD  11/17/2020 10:36 AM    [place]

## 2020-11-17 NOTE — Patient Instructions (Signed)
Medication Instructions:  Your physician has recommended you make the following change in your medication:  STOP: atorvastatin START: rosuvastatin 5 mg by mouth daily.  Sent in a 30 day supply please call or send a My Chart message letting us know how medication is working for you.  *If you need a refill on your cardiac medications before your next appointment, please call your pharmacy*   Lab Work: IN 3 MONTHS: FLP, LFT If you have labs (blood work) drawn today and your tests are completely normal, you will receive your results only by: [place] (if you have MyChart) OR A paper copy in the mail If you have any lab test that is abnormal or we need to change your treatment, we will call you to review the results.   Testing/Procedures: NONE   Follow-Up: At [facility], you and your health needs are our priority.  As part of our continuing mission to provide you with exceptional heart care, we have created designated Provider Care Teams.  These Care Teams include your primary Cardiologist (physician) and Advanced Practice Providers (APPs -  Physician Assistants and Nurse Practitioners) who all work together to provide you with the care you need, when you need it.  We recommend signing up for the patient portal called "MyChart".  Sign up information is provided on this After Visit Summary.  MyChart is used to connect with patients for Virtual Visits (Telemedicine).  Patients are able to view lab/test results, encounter notes, upcoming appointments, etc.  Non-urgent messages can be sent to your provider as well.   To learn more about what you can do with MyChart, go to [email or web address].    Your next appointment:   12 month(s)  The format for your next appointment:   In Person  Provider:   You may see Rudean Haskell, MD or one of the following Advanced Practice Providers on your designated Care Team:   Melina Copa, PA-C Ermalinda Barrios, PA-C

## 2020-12-13 ENCOUNTER — Telehealth: Payer: 59 | Admitting: Family Medicine

## 2020-12-13 NOTE — Progress Notes (Incomplete)
   Patty [name] is a 50 y.o. female who presents today for a virtual office visit.  Assessment/Plan:  New/Acute Problems: ***  Chronic Problems Addressed Today: No problem-specific Assessment & Plan notes found for this encounter.     Subjective:  HPI:  ***        Objective/Observations  Physical Exam: Gen: NAD, resting comfortably*** Pulm: Normal work of breathing Neuro: Grossly normal, moves all extremities Psych: Normal affect and thought content  Virtual Visit via Video   I connected with Patty [name] on @TODAY @ at  3:40 PM EDT by a video enabled telemedicine application and verified that I am speaking with the correct person using two identifiers. The limitations of evaluation and management by telemedicine and the availability of in person appointments were discussed. The patient expressed understanding and agreed to proceed.   Patient location: Home Provider location: [place] participating in the virtual visit: Myself and ***Patient     I,[place] Tourist information centre manager as a Education administrator for Dimas Chyle, MD.,have documented all relevant documentation on the behalf of Dimas Chyle, MD,as directed by  Dimas Chyle, MD while in the presence of Dimas Chyle, MD. *** Algis Greenhouse. Jerline Pain, MD 12/13/2020 8:14 AM

## 2020-12-31 ENCOUNTER — Other Ambulatory Visit: Payer: Self-pay

## 2020-12-31 ENCOUNTER — Ambulatory Visit (INDEPENDENT_AMBULATORY_CARE_PROVIDER_SITE_OTHER): Payer: 59 | Admitting: Family Medicine

## 2020-12-31 ENCOUNTER — Encounter: Payer: Self-pay | Admitting: Family Medicine

## 2020-12-31 VITALS — BP 109/69 | HR 60 | Temp 97.9°F | Ht 67.5 in | Wt 122.2 lb

## 2020-12-31 DIAGNOSIS — U071 COVID-19: Secondary | ICD-10-CM

## 2020-12-31 DIAGNOSIS — E782 Mixed hyperlipidemia: Secondary | ICD-10-CM

## 2020-12-31 MED ORDER — DICLOFENAC SODIUM 75 MG PO TBEC: 75.0000 mg | DELAYED_RELEASE_TABLET | Freq: Two times a day (BID) | ORAL | 0 refills | Status: DC

## 2020-12-31 NOTE — Assessment & Plan Note (Signed)
She has been started on Crestor 5 mg.  She is taking every other day.  Tolerating well.

## 2020-12-31 NOTE — Progress Notes (Signed)
   Patty [name] is a 50 y.o. female who presents today for an office visit.  Assessment/Plan:  New/Acute Problems: COVID Thankfully has recovered.  Still has some lingering issues including cough and chest wall pain.  Very low suspicion for cardiac etiology for her chest wall pain given her recent negative cardiac work-up.  Well score 0-doubt PE.  Likely has some costochondritis.  Recently had cardiac work-up which was negative.  Her pulmonary exam today is negative.  We will treat costochondritis with course of diclofenac.  She will let me know if not improving and we can get chest x-ray or further testing at that time.  Discussed reasons to return to care.  Chronic Problems Addressed Today: Mixed hyperlipidemia She has been started on Crestor 5 mg.  She is taking every other day.  Tolerating well.  Knee osteoarthritis Should have some improvement with diclofenac.  She has used topical diclofenac in the past and has done well with this.  She follows with sports medicine for this.    Subjective:  HPI:  Patient here for follow-up.  Unfortunately had COVID a few weeks ago.  Symptoms at that time include low-grade fever and cough.  She recovered after several days however still had ongoing chest pain cough and decreased exercise tolerance over the last several weeks.  Pain is predominantly located in the left side of her chest.  Is worse with pressing on the area.  She has been able to run at her previous activity levels without exacerbating anything there feels like cardiac pain however her performance is not likewise prior to getting COVID.  She has recently had extensive cardiac work-up which was negative.       Objective:  Physical Exam: BP 109/69   Pulse 60   Temp 97.9 F (36.6 C) (Temporal)   Ht 5' 7.5" (1.715 m)   Wt 122 lb 3.2 oz (55.4 kg)   SpO2 99%   BMI 18.86 kg/m   Gen: No acute distress, resting comfortably CV: Regular rate and rhythm with no murmurs  appreciated Pulm: Normal work of breathing, clear to auscultation bilaterally with no crackles, wheezes, or rhonchi MSK: Tenderness to palpation at left sternal border. Neuro: Grossly normal, moves all extremities Psych: Normal affect and thought content      Oakley Orban M. Jerline Pain, MD 12/31/2020 2:36 PM

## 2020-12-31 NOTE — Patient Instructions (Signed)
It was very nice to see you today!  You have inflammation in your chest wall.  Your exam today is normal.  You have no signs of heart or lung issue.  Please start the diclofenac.  Let me know if not improving over the next couple of weeks.  Take care, Dr Jerline Pain  PLEASE NOTE:  If you had any lab tests please let us know if you have not heard back within a few days. You may see your results on mychart before we have a chance to review them but we will give you a call once they are reviewed by [place]. If we ordered any referrals today, please let us know if you have not heard from their office within the next week.   Please try these tips to maintain a healthy lifestyle:  Eat at least 3 REAL meals and 1-2 snacks per day.  Aim for no more than 5 hours between eating.  If you eat breakfast, please do so within one hour of getting up.   Each meal should contain half fruits/vegetables, one quarter protein, and one quarter carbs (no bigger than a computer mouse)  Cut down on sweet beverages. This includes juice, soda, and sweet tea.   Drink at least 1 glass of water with each meal and aim for at least 8 glasses per day  Exercise at least 150 minutes every week.

## 2021-01-12 ENCOUNTER — Other Ambulatory Visit: Payer: Self-pay | Admitting: Internal Medicine

## 2021-01-21 DIAGNOSIS — M7918 Myalgia, other site: Secondary | ICD-10-CM | POA: Diagnosis not present

## 2021-01-21 DIAGNOSIS — M25671 Stiffness of right ankle, not elsewhere classified: Secondary | ICD-10-CM | POA: Diagnosis not present

## 2021-01-21 DIAGNOSIS — M722 Plantar fascial fibromatosis: Secondary | ICD-10-CM | POA: Diagnosis not present

## 2021-01-21 DIAGNOSIS — M25672 Stiffness of left ankle, not elsewhere classified: Secondary | ICD-10-CM | POA: Diagnosis not present

## 2021-01-24 DIAGNOSIS — M25671 Stiffness of right ankle, not elsewhere classified: Secondary | ICD-10-CM | POA: Diagnosis not present

## 2021-01-24 DIAGNOSIS — M7918 Myalgia, other site: Secondary | ICD-10-CM | POA: Diagnosis not present

## 2021-01-24 DIAGNOSIS — M25672 Stiffness of left ankle, not elsewhere classified: Secondary | ICD-10-CM | POA: Diagnosis not present

## 2021-01-24 DIAGNOSIS — M722 Plantar fascial fibromatosis: Secondary | ICD-10-CM | POA: Diagnosis not present

## 2021-01-25 ENCOUNTER — Telehealth: Payer: Self-pay

## 2021-01-25 NOTE — Telephone Encounter (Signed)
Left patient a VM to call us back to get this appt with Dr T scheduled. AM

## 2021-01-25 NOTE — Telephone Encounter (Signed)
Patient called wanting an urgent appt with Dr. Darene Lamer. She states she is not able to run and it is uncomfortable to walk.

## 2021-01-26 ENCOUNTER — Telehealth: Payer: Self-pay | Admitting: Sports Medicine

## 2021-01-26 ENCOUNTER — Ambulatory Visit (INDEPENDENT_AMBULATORY_CARE_PROVIDER_SITE_OTHER): Payer: BC Managed Care – PPO | Admitting: Sports Medicine

## 2021-01-26 ENCOUNTER — Other Ambulatory Visit: Payer: Self-pay

## 2021-01-26 DIAGNOSIS — M2550 Pain in unspecified joint: Secondary | ICD-10-CM | POA: Diagnosis not present

## 2021-01-26 DIAGNOSIS — M170 Bilateral primary osteoarthritis of knee: Secondary | ICD-10-CM

## 2021-01-26 DIAGNOSIS — M940 Chondrocostal junction syndrome [Tietze]: Secondary | ICD-10-CM

## 2021-01-26 MED ORDER — DICLOFENAC SODIUM 75 MG PO TBEC: 75.0000 mg | DELAYED_RELEASE_TABLET | Freq: Two times a day (BID) | ORAL | 3 refills | Status: DC

## 2021-01-26 NOTE — Assessment & Plan Note (Signed)
Patty [name] returns, she is a pleasant 50 year old female runner, we have done several series of viscosupplementation, Monovisc seems to work the best, last time we were only able to get Euflexxa approved, she had 2 or 3 placed in the right knee and 1 of 2 in the left knee so we did not really complete a full course in either knee. She did well and she was able to complete the [facility]. She is now having increasing discomfort in both knees, left worse than right. 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 Activities as tolerated in the meantime.

## 2021-01-26 NOTE — Telephone Encounter (Signed)
Patty [name] has bilateral x-ray confirmed osteoarthritis, she has done well with viscosupplementation in the past, please try to get Monovisc approved for both knees.  If unable to get Monovisc approved any formulary viscosupplementation be acceptable

## 2021-01-26 NOTE — Assessment & Plan Note (Signed)
Has been managed appropriately by her PCP. She can do Voltaren as needed.

## 2021-01-26 NOTE — Progress Notes (Signed)
    Procedures performed today:    None.  Independent interpretation of notes and tests performed by another provider:   None.  Brief History, Exam, Impression, and Recommendations:    Primary osteoarthritis of both knees Patty [name] returns, she is a pleasant 50 year old female runner, we have done several series of viscosupplementation, Monovisc seems to work the best, last time we were only able to get Euflexxa approved, she had 2 or 3 placed in the right knee and 1 of 2 in the left knee so we did not really complete a full course in either knee. She did well and she was able to complete the [facility] She is now having increasing discomfort in both knees, left worse than right. 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 Activities as tolerated in the meantime.  Costochondritis Has been managed appropriately by her PCP. She can do Voltaren as needed.  Polyarthralgia Knowledge is complaining of widespread aches, predominantly in the morning, she is on atorvastatin low-dose, 5 mg every other day, she did have some achiness at higher doses. This is certainly a possible etiology, we will get her tested for autoimmune disease. I have asked her to play around with one half tab daily versus the 5 mg every other day, and keep close contact with her PCP. If the achiness is intolerable she could certainly try pravastatin instead.  Chronic process with exacerbation and pharmacologic intervention   ___________________________________________ Gwen Her. Dianah Field, M.D., ABFM., CAQSM. Primary Care and [place] Instructor of [place] of [facility] of Medicine

## 2021-01-26 NOTE — Assessment & Plan Note (Signed)
Patty [name] is complaining of widespread aches, predominantly in the morning, she is on atorvastatin low-dose, 5 mg every other day, she did have some achiness at higher doses. This is certainly a possible etiology, we will get her tested for autoimmune disease. I have asked her to play around with one half tab daily versus the 5 mg every other day, and keep close contact with her PCP. If the achiness is intolerable she could certainly try pravastatin instead.

## 2021-01-27 ENCOUNTER — Ambulatory Visit: Payer: 59 | Admitting: Sports Medicine

## 2021-01-29 LAB — CBC WITH DIFFERENTIAL/PLATELET
Absolute Monocytes: 464 cells/uL (ref 200–950)
Basophils Absolute: 60 cells/uL (ref 0–200)
Basophils Relative: 1.5 %
Eosinophils Absolute: 72 cells/uL (ref 15–500)
Eosinophils Relative: 1.8 %
HCT: 37.5 % (ref 35.0–45.0)
Hemoglobin: 12.3 g/dL (ref 11.7–15.5)
Lymphs Abs: 1116 cells/uL (ref 850–3900)
MCH: 31.5 pg (ref 27.0–33.0)
MCHC: 32.8 g/dL (ref 32.0–36.0)
MCV: 95.9 fL (ref 80.0–100.0)
MPV: 10.6 fL (ref 7.5–12.5)
Monocytes Relative: 11.6 %
Neutro Abs: 2288 cells/uL (ref 1500–7800)
Neutrophils Relative %: 57.2 %
Platelets: 292 10*3/uL (ref 140–400)
RBC: 3.91 10*6/uL (ref 3.80–5.10)
RDW: 12.8 % (ref 11.0–15.0)
Total Lymphocyte: 27.9 %
WBC: 4 10*3/uL (ref 3.8–10.8)

## 2021-01-29 LAB — COMPREHENSIVE METABOLIC PANEL
AG Ratio: 1.8 (calc) (ref 1.0–2.5)
ALT: 25 U/L (ref 6–29)
AST: 25 U/L (ref 10–35)
Albumin: 4.7 g/dL (ref 3.6–5.1)
Alkaline phosphatase (APISO): 60 U/L (ref 37–153)
BUN: 12 mg/dL (ref 7–25)
CO2: 31 mmol/L (ref 20–32)
Calcium: 9.4 mg/dL (ref 8.6–10.4)
Chloride: 103 mmol/L (ref 98–110)
Creat: 0.79 mg/dL (ref 0.50–1.03)
Globulin: 2.6 g/dL (calc) (ref 1.9–3.7)
Glucose, Bld: 74 mg/dL (ref 65–99)
Potassium: 4.2 mmol/L (ref 3.5–5.3)
Sodium: 139 mmol/L (ref 135–146)
Total Bilirubin: 1 mg/dL (ref 0.2–1.2)
Total Protein: 7.3 g/dL (ref 6.1–8.1)

## 2021-01-29 LAB — RHEUMATOID FACTOR (IGA, IGG, IGM)
Rheumatoid Factor (IgA): <5 U (ref <=6)
Rheumatoid Factor (IgG): <5 U (ref <=6)
Rheumatoid Factor (IgM): <5 U (ref <=6)

## 2021-01-29 LAB — LUPUS(12) PANEL
Anti Nuclear Antibody (ANA): NEGATIVE
C3 Complement: 122 mg/dL (ref 83–193)
C4 Complement: 36 mg/dL (ref 15–57)
ENA SM Ab Ser-aCnc: <1 AI
Rheumatoid fact SerPl-aCnc: <14 IU/mL (ref <14)
Ribosomal P Protein Ab: <1 AI
SM/RNP: <1 AI
SSA (Ro) (ENA) Antibody, IgG: <1 AI
SSB (La) (ENA) Antibody, IgG: <1 AI
Scleroderma (Scl-70) (ENA) Antibody, IgG: <1 AI
Thyroperoxidase Ab SerPl-aCnc: 3 IU/mL (ref <9)
ds DNA Ab: <1 IU/mL

## 2021-01-29 LAB — URIC ACID: Uric Acid, Serum: 3.4 mg/dL (ref 2.5–7.0)

## 2021-01-29 LAB — CK: Total CK: 152 U/L — ABNORMAL HIGH (ref 29–143)

## 2021-01-29 LAB — CYCLIC CITRUL PEPTIDE ANTIBODY, IGG: Cyclic Citrullin Peptide Ab: <16 UNITS

## 2021-02-10 ENCOUNTER — Other Ambulatory Visit: Payer: Self-pay

## 2021-02-10 ENCOUNTER — Encounter: Payer: 59 | Admitting: Family Medicine

## 2021-02-10 DIAGNOSIS — M940 Chondrocostal junction syndrome [Tietze]: Secondary | ICD-10-CM

## 2021-02-10 MED ORDER — DICLOFENAC SODIUM 75 MG PO TBEC: 75.0000 mg | DELAYED_RELEASE_TABLET | Freq: Two times a day (BID) | ORAL | 3 refills | Status: DC

## 2021-02-10 NOTE — Telephone Encounter (Signed)
Patient called to have Diclofenac sent to Patty [name] on [address] in [place]. This has been sent

## 2021-02-17 ENCOUNTER — Other Ambulatory Visit: Payer: Self-pay

## 2021-02-17 ENCOUNTER — Other Ambulatory Visit: Payer: BC Managed Care – PPO

## 2021-02-17 DIAGNOSIS — E782 Mixed hyperlipidemia: Secondary | ICD-10-CM | POA: Diagnosis not present

## 2021-02-17 LAB — LIPID PANEL
Chol/HDL Ratio: 2.2 ratio (ref 0.0–4.4)
Cholesterol, Total: 164 mg/dL (ref 100–199)
HDL: 76 mg/dL (ref >39)
LDL Chol Calc (NIH): 76 mg/dL (ref 0–99)
Triglycerides: 59 mg/dL (ref 0–149)
VLDL Cholesterol Cal: 12 mg/dL (ref 5–40)

## 2021-02-17 LAB — HEPATIC FUNCTION PANEL
ALT: 29 IU/L (ref 0–32)
AST: 31 IU/L (ref 0–40)
Albumin: 4.7 g/dL (ref 3.8–4.9)
Alkaline Phosphatase: 65 IU/L (ref 44–121)
Bilirubin Total: 0.9 mg/dL (ref 0.0–1.2)
Bilirubin, Direct: 0.24 mg/dL (ref 0.00–0.40)
Total Protein: 6.7 g/dL (ref 6.0–8.5)

## 2021-02-22 DIAGNOSIS — M25562 Pain in left knee: Secondary | ICD-10-CM | POA: Diagnosis not present

## 2021-02-24 ENCOUNTER — Telehealth: Payer: Self-pay

## 2021-02-24 NOTE — Telephone Encounter (Signed)
Hold off on running for now, keep the appointment with me coming up, and I will CC this message to [facility] regarding the [place].

## 2021-02-24 NOTE — Telephone Encounter (Signed)
Pt lvm stating she ran 3 miles yesterday and experience knee pain that caused her to stop. She was able to walk home with a limp. Concerned about injury due to osteoarthritis concerns. Unable to obtain an appointment with Dr. Darene Lamer until next Wednesday.   Currently, she's wearing a knee brace but would like information or advise on how to proceed.  Please advise.

## 2021-02-24 NOTE — Telephone Encounter (Signed)
Pt has been advised of recommendation from Dr. Darene Lamer. Sent to scheduling for appt.

## 2021-02-28 ENCOUNTER — Ambulatory Visit: Payer: BC Managed Care – PPO | Admitting: Sports Medicine

## 2021-02-28 ENCOUNTER — Other Ambulatory Visit: Payer: Self-pay

## 2021-02-28 DIAGNOSIS — M170 Bilateral primary osteoarthritis of knee: Secondary | ICD-10-CM | POA: Diagnosis not present

## 2021-02-28 NOTE — Telephone Encounter (Signed)
Its been a month, checking on Monovisc approval or any other viscosupplement on her formulary.

## 2021-02-28 NOTE — Assessment & Plan Note (Signed)
Patty [name] returns, she is a 50 year old female runner, she has had several series of viscosupplementation, historically Monovisc is worked the best, we still have not gotten information on approval, I will look into this. She has had a CT scan, left knee showed osteoarthritis and a discoid meniscus, right knee was just osteoarthritis. We will continue to treat this conservatively as she does have osteoarthritis, she will return for PRP into the left knee, leukocyte poor. She understands to discontinue NSAIDs 1 week prior and keep well-hydrated. If Monovisc or any other Viscosupplement  becomes approved along the way we will add this as well. If all else fails we can refer for arthroscopic management of her discoid meniscus.

## 2021-02-28 NOTE — Progress Notes (Signed)
    Procedures performed today:    None.  Independent interpretation of notes and tests performed by another provider:   None.  Brief History, Exam, Impression, and Recommendations:    Primary osteoarthritis of both knees Patty [name] returns, she is a 50 year old female runner, she has had several series of viscosupplementation, historically Monovisc is worked the best, we still have not gotten information on approval, I will look into this. She has had a CT scan, left knee showed osteoarthritis and a discoid meniscus, right knee was just osteoarthritis. We will continue to treat this conservatively as she does have osteoarthritis, she will return for PRP into the left knee, leukocyte poor. She understands to discontinue NSAIDs 1 week prior and keep well-hydrated. If Monovisc or any other Viscosupplement  becomes approved along the way we will add this as well. If all else fails we can refer for arthroscopic management of her discoid meniscus.    ___________________________________________ Gwen Her. Dianah Field, M.D., ABFM., CAQSM. Primary Care and [place] Instructor of [place] of [facility] of Medicine

## 2021-03-03 ENCOUNTER — Ambulatory Visit: Payer: BC Managed Care – PPO | Admitting: Sports Medicine

## 2021-03-03 NOTE — Telephone Encounter (Signed)
[employer] paperwork signed by Dr. T and faxed to [employer] at [phone] Fax confirmation receipt received 

## 2021-03-03 NOTE — Telephone Encounter (Signed)
PA information submitted via [email or web address] Paperwork has been printed and given to Dr. Darene Lamer for signatures. Once obtained, information will be faxed to [employer] at [phone]

## 2021-03-03 NOTE — Telephone Encounter (Signed)
PA information submitted via [email or web address] Paperwork has been printed and given to Dr. Darene Lamer for signatures. Once obtained, information will be faxed to [employer] at [phone]

## 2021-03-07 ENCOUNTER — Ambulatory Visit (INDEPENDENT_AMBULATORY_CARE_PROVIDER_SITE_OTHER): Payer: BC Managed Care – PPO

## 2021-03-07 ENCOUNTER — Other Ambulatory Visit: Payer: Self-pay

## 2021-03-07 ENCOUNTER — Ambulatory Visit: Payer: Self-pay | Admitting: Sports Medicine

## 2021-03-07 DIAGNOSIS — M170 Bilateral primary osteoarthritis of knee: Secondary | ICD-10-CM | POA: Diagnosis not present

## 2021-03-07 NOTE — Progress Notes (Signed)
    Procedures performed today:    Procedure: Real-time Leukocyte Poor Ultrasound Guided Platelet Rich Plasma (PRP) Injection of left knee Device: Samsung HS60  Verbal informed consent obtained.  Time-out conducted.  Noted no overlying erythema, induration, or other signs of local infection.  Obtained 10 cc of blood from peripheral vein, then using the "RegenLab" centrifuge, red blood cells were separated from the plasma.  Noted trace effusion, we then drew the full 5 cc of leukocyte poor platelet rich plasma into a 10 cc syringe. Skin prepped in a sterile fashion.  Local anesthesia: Topical Ethyl chloride.  With sterile technique and under real time ultrasound guidance the platelet rich plasma (PRP) obtained above: Noted trace effusion, I advanced a 22-gauge needle into the suprapatellar recess and injected all of the PRP. Completed without difficulty  Advised to call if fevers/chills, erythema, induration, drainage, or persistent bleeding.  Images permanently stored and available for review in PACS.  Impression: Technically successful ultrasound guided Platelet Rich Plasma (PRP) injection.  Independent interpretation of notes and tests performed by another provider:   None.  Brief History, Exam, Impression, and Recommendations:    Primary osteoarthritis of both knees Pleasant 50 year old female runner, has tried several series of viscosupplementation, historically Monovisc is worked the best. She also had a CT scan that showed discoid meniscus and osteoarthritis, today we did PRP leukocyte poor x1 of 3 into the left knee, return in 1 week for #2 of 3. Of note we are still waiting on Monovisc approval.    ___________________________________________ Gwen Her. Dianah Field, M.D., ABFM., CAQSM. Primary Care and [place] Instructor of [place] of [facility] of Medicine

## 2021-03-07 NOTE — Assessment & Plan Note (Addendum)
Pleasant 50 year old female runner, has tried several series of viscosupplementation, historically Monovisc is worked the best. She also had a CT scan that showed discoid meniscus and osteoarthritis, today we did PRP leukocyte poor x1 of 3 into the left knee, return in 1 week for #2 of 3. Of note we are still waiting on Monovisc approval.

## 2021-03-09 DIAGNOSIS — M170 Bilateral primary osteoarthritis of knee: Secondary | ICD-10-CM

## 2021-03-11 NOTE — Telephone Encounter (Signed)
This encounter was created in error - please disregard.

## 2021-03-15 ENCOUNTER — Ambulatory Visit (INDEPENDENT_AMBULATORY_CARE_PROVIDER_SITE_OTHER): Payer: BC Managed Care – PPO

## 2021-03-15 ENCOUNTER — Ambulatory Visit: Payer: Self-pay | Admitting: Sports Medicine

## 2021-03-15 DIAGNOSIS — M170 Bilateral primary osteoarthritis of knee: Secondary | ICD-10-CM | POA: Diagnosis not present

## 2021-03-15 NOTE — Progress Notes (Signed)
    Procedures performed today:    Procedure: Real-time Leukocyte Poor Ultrasound Guided Platelet Rich Plasma (PRP) Injection of left knee Device: Samsung HS60  Verbal informed consent obtained.  Time-out conducted.  Noted no overlying erythema, induration, or other signs of local infection.  Obtained 10 cc of blood from peripheral vein, then using the "RegenLab" centrifuge, red blood cells were separated from the plasma.  Noted trace effusion, we then drew the full 5 cc of leukocyte poor platelet rich plasma into a 10 cc syringe. Skin prepped in a sterile fashion.  Local anesthesia: Topical Ethyl chloride.  With sterile technique and under real time ultrasound guidance the platelet rich plasma (PRP) obtained above: Noted trace effusion, I advanced a 22-gauge needle into the suprapatellar recess and injected all of the PRP. Completed without difficulty  Advised to call if fevers/chills, erythema, induration, drainage, or persistent bleeding.  Images permanently stored and available for review in PACS.  Impression: Technically successful ultrasound guided Platelet Rich Plasma (PRP) injection.  Independent interpretation of notes and tests performed by another provider:   None.  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 Return to see me in a week for #3.   ___________________________________________ Gwen Her. Dianah Field, M.D., ABFM., CAQSM. Primary Care and [place] Instructor of [place] of [facility] of Medicine

## 2021-03-15 NOTE — Assessment & Plan Note (Signed)
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 Return to see me in a week for #3.

## 2021-03-17 NOTE — Telephone Encounter (Signed)
Benefits Investigation Details received from [employer].  Medical: Deductible does not ally. Once the deductible has been met, patient is responsible for a copay. Only one copay per date. PA is required through [place]. To initiate, submit online at [email or web address].ee Pharmacy: The product is not covered under th pharmacy plan May fill through: Specialty Pharmacy or [place]: [facility]  OV Copay/Coinsurance: [phone] Product Copay: $0 Administration Coinsurance: $0 Administration Copay: $0 Deductible: $2500 (Met: $31.48) Out of Pocket Max: $3000 (Met: $131.74)

## 2021-03-22 ENCOUNTER — Ambulatory Visit: Payer: Self-pay | Admitting: Sports Medicine

## 2021-03-22 ENCOUNTER — Other Ambulatory Visit: Payer: Self-pay

## 2021-03-22 ENCOUNTER — Ambulatory Visit (INDEPENDENT_AMBULATORY_CARE_PROVIDER_SITE_OTHER): Payer: BC Managed Care – PPO

## 2021-03-22 DIAGNOSIS — M170 Bilateral primary osteoarthritis of knee: Secondary | ICD-10-CM | POA: Diagnosis not present

## 2021-03-22 NOTE — Progress Notes (Addendum)
    Procedures performed today:    Procedure: Real-time Leukocyte Poor Ultrasound Guided Platelet Rich Plasma (PRP) Injection of left knee Device: Samsung HS60  Verbal informed consent obtained.  Time-out conducted.  Noted no overlying erythema, induration, or other signs of local infection.  Obtained 10 cc of blood from peripheral vein, then using the "RegenLab" centrifuge, red blood cells were separated from the plasma.  Noted trace effusion, we then drew the full 5 cc of leukocyte poor platelet rich plasma into a 10 cc syringe. Skin prepped in a sterile fashion.  Local anesthesia: Topical Ethyl chloride.  With sterile technique and under real time ultrasound guidance the platelet rich plasma (PRP) obtained above: Noted trace effusion, I advanced a 22-gauge needle into the suprapatellar recess and injected all of the PRP. Completed without difficulty  Advised to call if fevers/chills, erythema, induration, drainage, or persistent bleeding.  Images permanently stored and available for review in PACS.  Impression: Technically successful ultrasound guided Platelet Rich Plasma (PRP) injection.  Independent interpretation of notes and tests performed by another provider:   None.  Brief History, Exam, Impression, and Recommendations:    Primary osteoarthritis of both knees This is a very pleasant 50 year old female runner, multiple series of viscosupplementation, historically Monovisc is worked the best, she did have a CT that showed a discoid meniscus, osteoarthritis, today we are doing RegenLab PRP leukocyte poor #3 of 3 left knee. She already reports that she is over 90% better after 2 PRP injections. Return to see me as needed.    ___________________________________________ Patty [name]. Dianah Field, M.D., ABFM., CAQSM. Primary Care and [place] Instructor of [place] of [facility] of Medicine

## 2021-03-22 NOTE — Assessment & Plan Note (Addendum)
This is a very pleasant 50 year old female runner, multiple series of viscosupplementation, historically Monovisc is worked the best, she did have a CT that showed a discoid meniscus, osteoarthritis, today we are doing RegenLab PRP leukocyte poor #3 of 3 left knee. She already reports that she is over 90% better after 2 PRP injections. Return to see me as needed.

## 2021-03-23 NOTE — Telephone Encounter (Signed)
Per [facility], this needs to be reviewed again. Waiting to hear back from them. Spoke with Autumn Patty and he is also going to check on the status of the review.

## 2021-03-30 NOTE — Telephone Encounter (Addendum)
Spoke with Keya at [facility] and she guided me through the website [email or web address] to complete the form online. She said that there is a 36 hour turn around time and the determination would be faxed as well as emailed. My work e-mail provided to receive the response.   Pt aware of current status of PA.   When I discussed the deductibles with her, she said that her company just changed insurances from [facility] to [place] [facility] and that one thing the employees had asked about was their deductible amounts being transferred to the new [employer] in which she was told that would happen. She said she was going to give her insurance company a call and get more info about the deductible amounts being transferred over and once she has that done she will let me know. When that has been changed in the [place] system, we can re-run a benefits investigation detail reflecting the new deductible met amounts and then proceed from there, but the PA will have already been done through [place] [facility] [place].

## 2021-03-30 NOTE — Telephone Encounter (Addendum)
Review received  Benefits Investigation Details received from [employer] Injection: Monovisc  Medical: Specialty pharnacty fullfilment available through medical benefits Deductible applies. Once the deductible has been met, pt is responsible for a coinsurance. Once the OOP has been met, pt is covered at 100%. PA is required for the drug through [place] [facility] [place]. To initiate, call [phone]   Pharmacy: The product is not covered under the pharmacy plan Specialty Pharmacy: [facility]  May fill through: [facility] and [place] Copay/Coinsurance: $62 Product Copay: 20% Administration Coinsurance: 20% Administration Copay: Deductible: $2500 (met: $346.81) Out of Pocket Max: $3000 (met: $519.15)

## 2021-03-31 NOTE — Telephone Encounter (Signed)
Fax received from [facility] requesting medical records to be faxed for review. OV notes and imaging report printed and faxed to [facility] [place]. Fax confirmation received.

## 2021-04-01 DIAGNOSIS — Z1231 Encounter for screening mammogram for malignant neoplasm of breast: Secondary | ICD-10-CM | POA: Diagnosis not present

## 2021-04-04 ENCOUNTER — Telehealth: Payer: Self-pay | Admitting: Internal Medicine

## 2021-04-04 DIAGNOSIS — E782 Mixed hyperlipidemia: Secondary | ICD-10-CM

## 2021-04-04 NOTE — Telephone Encounter (Signed)
Pt c/o medication issue:  1. Name of Medication: rosuvastatin (CRESTOR) 5 MG tablet  2. How are you currently taking this medication (dosage and times per day)? TAKE 1 TABLET(5 MG) BY MOUTH DAILY  3. Are you having a reaction (difficulty breathing--STAT)? No   4. What is your medication issue? Patient says that medication is killing her joints. Wants to know if something else Dr. Gasper Sells could prescribe her.

## 2021-04-04 NOTE — Telephone Encounter (Signed)
Will route to Dr. Gasper Sells to see if he would like to try pt on a different medication since she is having terrible joint aches with Rosuvastatin.

## 2021-04-05 NOTE — Telephone Encounter (Signed)
We can switch to pravastatin 80 mg PO daily.  If that doesn't work I'll look into non statin therapy    Thanks,  MAC

## 2021-04-05 NOTE — Telephone Encounter (Signed)
Left message to call back  

## 2021-04-06 NOTE — Addendum Note (Signed)
Addended by: Ranelle Oyster on: 04/06/2021 09:10 AM   Modules accepted: Orders

## 2021-04-06 NOTE — Telephone Encounter (Addendum)
PA determination received from [facility] PA has been approved for Monovisc Pt aware of approval and that we will call her for scheduling once we receive the injections. Rx will be sent to [place] [facility] Pt would like to hold off on the Rx being sent in at this time because of her deductible needing to be met. Instructed her to call back when she was ready for the injections, however, the PA is only good until 09/27/2021. If the injections are not done prior to that, a new PA will need to be done.

## 2021-04-07 NOTE — Telephone Encounter (Signed)
Called patient to discuss medication change. She states she previously took Lipitor and did not tolerate it. Reports great improvement in joint and muscle pain since stopping rosuvastatin 1 week ago. She requests another treatment that is not a statin. I advised that Dr. Oralia Rud notes indicate trying Zetia or Nexlitol in the future if statins are not tolerated. Advised her that I will forward message to Dr. Gasper Sells for advice and someone from our office will call her back. She verbalized understanding and agreement and thanked me for the call.

## 2021-04-12 DIAGNOSIS — M25562 Pain in left knee: Secondary | ICD-10-CM | POA: Diagnosis not present

## 2021-04-12 MED ORDER — EZETIMIBE 10 MG PO TABS: 10.0000 mg | ORAL_TABLET | Freq: Every day | ORAL | 3 refills | Status: DC

## 2021-04-12 NOTE — Telephone Encounter (Signed)
Called pt reviewed MD recommendations.  Pt is agreeable to plan orders placed and f/u fasting lab work scheduled.

## 2021-04-20 DIAGNOSIS — M25562 Pain in left knee: Secondary | ICD-10-CM | POA: Diagnosis not present

## 2021-04-22 DIAGNOSIS — M25562 Pain in left knee: Secondary | ICD-10-CM | POA: Diagnosis not present

## 2021-05-09 MED ORDER — HYALURONAN 88 MG/4ML IX SOSY: PREFILLED_SYRINGE | INTRA_ARTICULAR | 1 refills | Status: DC

## 2021-05-09 NOTE — Addendum Note (Signed)
Addended by: Silverio Decamp on: 05/09/2021 08:55 AM   Modules accepted: Orders

## 2021-05-13 NOTE — Telephone Encounter (Signed)
I sent this already [place], any ideas?

## 2021-05-17 ENCOUNTER — Other Ambulatory Visit: Payer: Self-pay

## 2021-05-17 ENCOUNTER — Telehealth: Payer: Self-pay

## 2021-05-17 DIAGNOSIS — M170 Bilateral primary osteoarthritis of knee: Secondary | ICD-10-CM

## 2021-05-17 MED ORDER — HYALURONAN 88 MG/4ML IX SOSY: PREFILLED_SYRINGE | INTRA_ARTICULAR | 1 refills | Status: DC

## 2021-05-17 NOTE — Telephone Encounter (Signed)
Thank you so much for your comprehensive approach to this one!

## 2021-05-17 NOTE — Telephone Encounter (Signed)
Pt called to check on the status of the Monovisc injections. Looking in her chart, the Rx was sent to [facility] on 05/09/21. The Rx should have been sent to [place] [facility]. I called [facility] and spoke with Sonia Side, a pharmacist there, and gave verbal orders for the Rx as well as sent the Rx electronically.   E-Prescribing Status: Receipt confirmed by pharmacy (05/17/2021  8:56 AM EST)  Sonia Side said that with the PA already being done it should not take long for the Rx to be shipped out. He did mark the Rx as being STAT so that it will hopefully get sent out in the next day or two.   Pt aware of Rx status and that we will call her as soon as the injection syringes arrive for scheduling. I told the pt that if she wanted to check on the status again to give me a call.

## 2021-05-22 NOTE — Progress Notes (Signed)
Cardiology Office Note:    Date:  05/23/2021   ID:  Patty [name], DOB 1971/04/09, MRN [id number]  PCP:  Patty Barrack, MD  [facility] Cardiologist:  Rudean Haskell MD [place] Electrophysiologist:  None   CC: HLD  History of Present Illness:    Patty [name] is a 50 y.o. female with a hx of Palpitations, HLD who presents for evaluation 07/07/20.  In interim of this visit, patient had normal Echo, Zio, and CAC.Seen 08/12/20.  In interim of this visit, patient ran the [facility]. Has called in with myalgia concerns and follow up for LDL mgmt.  Patient notes that she is doing better off the statin.  Notes that her running and and walking up the stairs.  Tried cutting the medication in half.  Tried every other day. Cannot tolerate statins.   There are no interval hospital/ED visit.    No chest pain or pressure .  No SOB/DOE and no PND/Orthopnea.  No weight gain or leg swelling.  Still has palpitations, she describes it as a rush of blood that sometimes needs to make her get up.   Past Medical History:  Diagnosis Date   Anxiety    History of recurrent UTIs    Hyperlipidemia    Palpitations     Past Surgical History:  Procedure Laterality Date   COLONOSCOPY  15 years ago    in CT-normal   ENDOMETRIAL ABLATION  10/16/2013   SEPTOPLASTY      Current Medications: Current Meds  Medication Sig   diclofenac (VOLTAREN) 75 MG EC tablet Take 1 tablet (75 mg total) by mouth 2 (two) times daily. (Patient taking differently: Take 75 mg by mouth as needed for mild pain or moderate pain.)   ezetimibe (ZETIA) 10 MG tablet Take 1 tablet (10 mg total) by mouth daily.   Hyaluronan 88 MG/4ML SOSY Inject 1 syringe into each knee.  Dispense #2 syringes of MONOvisc.  Dx: Primary osteoarthritis of both knees.   Multiple Vitamin (MULTIVITAMIN) tablet Take 1 tablet by mouth daily.      Allergies:   Rosuvastatin calcium and Atorvastatin   Social History    Socioeconomic History   Marital status: Married    Spouse name: Not on file   Number of children: 2   Years of education: Not on file   Highest education level: Not on file  Occupational History   Occupation: Passenger transport manager  Tobacco Use   Smoking status: Never   Smokeless tobacco: Never  Vaping Use   Vaping Use: Never used  Substance and Sexual Activity   Alcohol use: No    Comment: Social  beer/wine   Drug use: No   Sexual activity: Yes    Partners: Male    Birth control/protection: None  Other Topics Concern   Not on file  Social History Narrative   Not on file   Social Determinants of Health   Financial Resource Strain: Not on file  Food Insecurity: Not on file  Transportation Needs: Not on file  Physical Activity: Not on file  Stress: Not on file  Social Connections: Not on file    Social:  Ihor Austin ran  to the [employer] in 2022  Family History: The patient's family history includes Heart attack in her maternal grandfather and maternal grandmother; Hypertension in her father. There is no history of Colon cancer, Colon polyps, Esophageal cancer, Rectal cancer, or Stomach cancer. History of coronary artery disease notable for maternal and  paternal grandfather and grandmother. History of heart failure notable for no members. No history of cardiomyopathies including hypertrophic cardiomyopathy, left ventricular non-compaction, or arrhythmogenic right ventricular cardiomyopathy. History of arrhythmia notable for no members. Denies family history of sudden cardiac death including drowning, car accidents, or unexplained deaths in the family. No history of bicuspid aortic valve or aortic aneurysm or dissection.   ROS:   Please see the history of present illness.     All other systems reviewed and are negative.  EKGs/Labs/Other Studies Reviewed:    The following studies were reviewed today:  EKG:   05/20/21: Srrate 68 WNL 07/05/20:  Sinus  Bradycardia rate 53; WNL  Cardiac Event Monitoring: Date: 08/04/20 Results: Patient had a minimum heart rate of 44 bpm, maximum heart rate of 182 bpm, and average heart rate of 70 bpm. Predominant underlying rhythm was sinus rhythm. Two runs of supraventricular tachycardia occurred lasting 6 beats at longest with a max rate of 136 bpm at fastest. Isolated PACs were rare (<1.0%), with rare couplets and triplets present. Isolated PVCs were rare (<1.0%), with rare couplets present. No evidence of complete heart block. Triggered and diary events associated with sinus rhythm (algorithrm also reads for SVE but there is no post extra-systolic pause).   No malignant arrhythmias.   Transthoracic Echocardiogram: Date:07/16/20 Results: 1. Left ventricular ejection fraction, by estimation, is 55 to 60%. The  left ventricle has normal function. The left ventricle has no regional  wall motion abnormalities. Left ventricular diastolic parameters were  normal.   2. Right ventricular systolic function is normal. The right ventricular  size is normal. There is normal pulmonary artery systolic pressure.   3. The mitral valve is normal in structure. Trivial mitral valve  regurgitation. No evidence of mitral stenosis.   4. The aortic valve is normal in structure. Aortic valve regurgitation is  not visualized. No aortic stenosis is present.   5. The inferior vena cava is normal in size with greater than 50%  respiratory variability, suggesting right atrial pressure of 3 mmHg.   CAC Scan: Date: 07/21/20 Results: IMPRESSION: 1. Coronary calcium score of 0. This was 1st percentile for age, gender, and race matched controls.    Recent Labs: 07/07/2020: TSH 2.010 07/09/2020: Magnesium 2.1 01/26/2021: BUN 12; Creat 0.79; Hemoglobin 12.3; Platelets 292; Potassium 4.2; Sodium 139 02/17/2021: ALT 29  Recent Lipid Panel    Component Value Date/Time   CHOL 164 02/17/2021 0749   TRIG 59 02/17/2021 0749   HDL  76 02/17/2021 0749   CHOLHDL 2.2 02/17/2021 0749   CHOLHDL 2 06/29/2020 0835   VLDL 13.8 06/29/2020 0835   LDLCALC 76 02/17/2021 0749   LDLCALC 113 (H) 02/10/2020 1011    Physical Exam:    VS:  BP (!) 102/58    Pulse 68    Ht 5' 7.5" (1.715 m)    Wt 54 kg    SpO2 98%    BMI 18.36 kg/m     Wt Readings from Last 3 Encounters:  05/23/21 54 kg  12/31/20 55.4 kg  11/17/20 55.8 kg    Gen: No physical distress Neck: No JVD Cardiac: No Rubs or Gallops, no Murmur, normal rhythm +2 radial pulses Respiratory: Clear to auscultation bilaterally, normal effort, normal  respiratory rate GI: Soft, nontender, non-distended  MS: No  edema;  moves all extremities Integument: Skin feels warm Neuro:  At time of evaluation, alert and oriented to person/place/time/situation  Psych: Normal affect   ASSESSMENT:    1.  Mixed hyperlipidemia   2. PAC (premature atrial contraction)   3. Statin intolerance    PLAN:    Hyperlipidemia (mixed) Statin intolerance (palpitations with atorvastatin and myalgias on low dose rosuvastatin even 2.5 mg Q48 hours) PACs - no PACs during EKG today (during sx) will not start BB - will check lipids and Lp(a), if WNL, will not pursue lipid lowering therapy until ASCVD risk increases to 7.5%  One year follow up unless new symptoms or abnormal test results warranting change in plan      Medication Adjustments/Labs and Tests Ordered: Current medicines are reviewed at length with the patient today.  Concerns regarding medicines are outlined above.  Orders Placed This Encounter  Procedures   Lipoprotein A (LPA)   Lipid panel   EKG 12-Lead    No orders of the defined types were placed in this encounter.    Patient Instructions  Medication Instructions:  Your physician recommends that you continue on your current medications as directed. Please refer to the Current Medication list given to you today.  *If you need a refill on your cardiac medications before  your next appointment, please call your pharmacy*   Lab Work: TODAY: Lipoprotein A and Lipid panel If you have labs (blood work) drawn today and your tests are completely normal, you will receive your results only by: [place] (if you have MyChart) OR A paper copy in the mail If you have any lab test that is abnormal or we need to change your treatment, we will call you to review the results.   Testing/Procedures: NONE   Follow-Up: At [facility], you and your health needs are our priority.  As part of our continuing mission to provide you with exceptional heart care, we have created designated Provider Care Teams.  These Care Teams include your primary Cardiologist (physician) and Advanced Practice Providers (APPs -  Physician Assistants and Nurse Practitioners) who all work together to provide you with the care you need, when you need it.   Your next appointment:   12 month(s)  The format for your next appointment:   In Person  Provider:   Werner Lean, MD         Signed, Werner Lean, MD  05/23/2021 8:57 AM    [place]

## 2021-05-23 ENCOUNTER — Encounter: Payer: Self-pay | Admitting: Internal Medicine

## 2021-05-23 ENCOUNTER — Other Ambulatory Visit: Payer: Self-pay

## 2021-05-23 ENCOUNTER — Ambulatory Visit: Payer: BC Managed Care – PPO | Admitting: Internal Medicine

## 2021-05-23 VITALS — BP 102/58 | HR 68 | Ht 67.5 in | Wt 119.0 lb

## 2021-05-23 DIAGNOSIS — I491 Atrial premature depolarization: Secondary | ICD-10-CM | POA: Diagnosis not present

## 2021-05-23 DIAGNOSIS — Z789 Other specified health status: Secondary | ICD-10-CM | POA: Diagnosis not present

## 2021-05-23 DIAGNOSIS — E782 Mixed hyperlipidemia: Secondary | ICD-10-CM

## 2021-05-23 NOTE — Patient Instructions (Signed)
Medication Instructions:  Your physician recommends that you continue on your current medications as directed. Please refer to the Current Medication list given to you today.  *If you need a refill on your cardiac medications before your next appointment, please call your pharmacy*   Lab Work: TODAY: Lipoprotein A and Lipid panel If you have labs (blood work) drawn today and your tests are completely normal, you will receive your results only by: [place] (if you have MyChart) OR A paper copy in the mail If you have any lab test that is abnormal or we need to change your treatment, we will call you to review the results.   Testing/Procedures: NONE   Follow-Up: At [facility], you and your health needs are our priority.  As part of our continuing mission to provide you with exceptional heart care, we have created designated Provider Care Teams.  These Care Teams include your primary Cardiologist (physician) and Advanced Practice Providers (APPs -  Physician Assistants and Nurse Practitioners) who all work together to provide you with the care you need, when you need it.   Your next appointment:   12 month(s)  The format for your next appointment:   In Person  Provider:   Werner Lean, MD

## 2021-05-24 ENCOUNTER — Encounter: Payer: Self-pay | Admitting: Internal Medicine

## 2021-05-24 LAB — LIPID PANEL
Chol/HDL Ratio: 2.3 ratio (ref 0.0–4.4)
Cholesterol, Total: 223 mg/dL — ABNORMAL HIGH (ref 100–199)
HDL: 99 mg/dL (ref >39)
LDL Chol Calc (NIH): 110 mg/dL — ABNORMAL HIGH (ref 0–99)
Triglycerides: 81 mg/dL (ref 0–149)
VLDL Cholesterol Cal: 14 mg/dL (ref 5–40)

## 2021-05-24 LAB — LIPOPROTEIN A (LPA): Lipoprotein (a): 159.1 nmol/L — ABNORMAL HIGH (ref <75.0)

## 2021-05-26 NOTE — Telephone Encounter (Signed)
We have not received the Monovisc injections yet. I called [facility] and they said they did get the Rx, but when they file the claim they are getting a rejection that a PA needs to be done. We already have a PA done. I was told the Rx was being sent through to dispense 8 mg, 2 syringes, one for each knee, and they said that the PA is only for 1 knee. I called [employer] [place] and they said that the PA on file is for the left knee only. They said that they are not able to go in and change the PA, that a new PA needed to be submitted for the right knee only. I was guided through their website to get to the form that needed to be completed and filled it out and submitted it for review. I called and spoke with Montia and she said that she is out of the country until 06/03/21 but wants to get the injections as soon as she can after her return. I told there that [employer] [place] said that it can take up to 36 hours to make a determination. I am going to follow this PA closely so we can get her injections in and get her scheduled ASAP.

## 2021-05-31 NOTE — Telephone Encounter (Signed)
Spoke with [facility] and the PA for the right knee injection has been approved. Called and spoke with [facility] and they are reprocessing the Rx now and have the Rx marked as "URGENT". Pt aware of status via voicemail.

## 2021-06-10 NOTE — Telephone Encounter (Signed)
Amy with [facility] called and LVM stating she needed to speak to someone about the Rx for Monovisc. I called back and spoke with Kenney Houseman at [place] who said that the specialty pharmacy does not file medical, only pharmacy. She said that the resolution team recommends doing a buy and bill since we have the PA's from her insurance company. Spoke with Dr. Darene Lamer who said to give Autumn Patty a call and have him send [place] the syringes. Spoke with Autumn Patty and he said he would order the syringes and have them shipped to our office and we should receive them Monday-Tuesday. I called pt and gave her an update and told her I could call her once I had the injections in.

## 2021-06-22 ENCOUNTER — Ambulatory Visit: Payer: BC Managed Care – PPO | Admitting: Sports Medicine

## 2021-06-22 ENCOUNTER — Other Ambulatory Visit: Payer: Self-pay

## 2021-06-22 ENCOUNTER — Ambulatory Visit (INDEPENDENT_AMBULATORY_CARE_PROVIDER_SITE_OTHER): Payer: BC Managed Care – PPO

## 2021-06-22 DIAGNOSIS — M170 Bilateral primary osteoarthritis of knee: Secondary | ICD-10-CM | POA: Diagnosis not present

## 2021-06-22 NOTE — Assessment & Plan Note (Signed)
This is a very pleasant 51 year old female runner, she has had several series of viscosupplementation, Monovisc has worked the best. We did RegenLab PRP leukocyte poor x3 into the left knee which she feels helped her pain from her meniscal injury, which is mostly posterior joint line. Monovisc has worked well for medial joint line pain likely due to her osteoarthritis, today I did Monovisc injections into both knees, return to see me as needed.

## 2021-06-22 NOTE — Progress Notes (Signed)
° ° °  Procedures performed today:    Procedure: Real-time Ultrasound Guided injection of the left knee Device: Samsung HS60  Verbal informed consent obtained.  Time-out conducted.  Noted no overlying erythema, induration, or other signs of local infection.  Skin prepped in a sterile fashion.  Local anesthesia: Topical Ethyl chloride.  With sterile technique and under real time ultrasound guidance: Noted trace effusion, 88 mg/4 mL of MonoVisc (sodium hyaluronate) in a prefilled syringe was injected easily into the knee through a 22-gauge needle. Completed without difficulty  Advised to call if fevers/chills, erythema, induration, drainage, or persistent bleeding.  Images permanently stored and available for review in PACS.  Impression: Technically successful ultrasound guided injection.  Procedure: Real-time Ultrasound Guided injection of the right knee Device: Samsung HS60  Verbal informed consent obtained.  Time-out conducted.  Noted no overlying erythema, induration, or other signs of local infection.  Skin prepped in a sterile fashion.  Local anesthesia: Topical Ethyl chloride.  With sterile technique and under real time ultrasound guidance: Noted trace effusion, 88 mg/4 mL of MonoVisc (sodium hyaluronate) in a prefilled syringe was injected easily into the knee through a 22-gauge needle. Completed without difficulty  Advised to call if fevers/chills, erythema, induration, drainage, or persistent bleeding.  Images permanently stored and available for review in PACS.  Impression: Technically successful ultrasound guided injection.  Independent interpretation of notes and tests performed by another provider:   None.  Brief History, Exam, Impression, and Recommendations:    Primary osteoarthritis of both knees This is a very pleasant 51 year old female runner, she has had several series of viscosupplementation, Monovisc has worked the best. We did RegenLab PRP leukocyte poor x3  into the left knee which she feels helped her pain from her meniscal injury, which is mostly posterior joint line. Monovisc has worked well for medial joint line pain likely due to her osteoarthritis, today I did Monovisc injections into both knees, return to see me as needed.    ___________________________________________ Gwen Her. Dianah Field, M.D., ABFM., CAQSM. Primary Care and [place] [employer] Instructor of [place] [employer] [facility]

## 2021-07-01 ENCOUNTER — Other Ambulatory Visit: Payer: Self-pay

## 2021-07-01 ENCOUNTER — Ambulatory Visit: Payer: BC Managed Care – PPO | Admitting: Family Medicine

## 2021-07-01 ENCOUNTER — Encounter: Payer: Self-pay | Admitting: Family Medicine

## 2021-07-01 VITALS — BP 110/64 | HR 84 | Temp 98.0°F | Ht 67.5 in | Wt 120.4 lb

## 2021-07-01 DIAGNOSIS — E782 Mixed hyperlipidemia: Secondary | ICD-10-CM

## 2021-07-01 DIAGNOSIS — J329 Chronic sinusitis, unspecified: Secondary | ICD-10-CM

## 2021-07-01 DIAGNOSIS — G4730 Sleep apnea, unspecified: Secondary | ICD-10-CM | POA: Diagnosis not present

## 2021-07-01 MED ORDER — AZELASTINE HCL 0.1 % NA SOLN: 2.0000 | Freq: Two times a day (BID) | NASAL | 12 refills | Status: DC

## 2021-07-01 MED ORDER — AZITHROMYCIN 250 MG PO TABS: ORAL_TABLET | ORAL | 0 refills | Status: DC

## 2021-07-01 NOTE — Progress Notes (Signed)
° °  Patty [name] is a 51 y.o. female who presents today for an office visit.  Assessment/Plan:  New/Acute Problems: Sinusitis No red flags.  Given length of symptoms we will start antibiotics.  We discussed Augmentin however azithromycin is worked better for her in the past.  We will send in [place].  Also start Astelin nasal spray.  She will continue Flonase.  Continue over-the-counter meds.  Encouraged hydration.  Discussed reasons to return to care.  Neck Pain  No red flags.  Likely due to lymphadenopathy.  Should improve with treatment for above.  Chronic Problems Addressed Today: Mixed hyperlipidemia Doing better with Zetia 10 mg daily.  No myalgias.  Sleep-disordered breathing Will place referral for sleep study.     Subjective:  HPI:  Patient here with concerns for sinus infection.  Started a few weeks ago.  Has a lot of pressure in her nasal cavity and facial pressure.  She started taking Zyrtec a day ago this seems to have helped a little bit.  She has also had ongoing issues with neck pain and stiffness.  Located on her bilateral neck.  Worse with certain motions.  Worse when laying down.  She has had some cough.  Cough has been nonproductive.  Some malaise.  No fevers.  Has tried using Flonase which helps.  No known sick contacts.  She also would like to have a referral for sleep study.  She was diagnosed with mild sleep apnea in the past.  Did not qualify for CPAP.  This was several years ago.  She will sometimes wake up in the melanite gasping for air.       Objective:  Physical Exam: BP 110/64 (BP Location: Left Arm, Patient Position: Sitting, Cuff Size: Normal)    Pulse 84    Temp 98 F (36.7 C) (Temporal)    Ht 5' 7.5" (1.715 m)    Wt 120 lb 6.4 oz (54.6 kg)    SpO2 98%    BMI 18.58 kg/m   Gen: No acute distress, resting comfortably HEENT: TMs with clear effusion.  OP erythematous.  Submandibular and anterior cervical lymphadenopathy noted. CV: Regular rate and  rhythm with no murmurs appreciated Pulm: Normal work of breathing, clear to auscultation bilaterally with no crackles, wheezes, or rhonchi Neuro: Grossly normal, moves all extremities Psych: Normal affect and thought content      Pilar Corrales M. Jerline Pain, MD 07/01/2021 11:09 AM

## 2021-07-01 NOTE — Assessment & Plan Note (Signed)
Doing better with Zetia 10 mg daily.  No myalgias.

## 2021-07-01 NOTE — Patient Instructions (Signed)
It was very nice to see you today!  Please start the Z-Pak and Astelin.  I will refer you for sleep study.  Let us know if not improving by next week.  Take care, Dr Jerline Pain  PLEASE NOTE:  If you had any lab tests please let us know if you have not heard back within a few days. You may see your results on mychart before we have a chance to review them but we will give you a call once they are reviewed by [place]. If we ordered any referrals today, please let us know if you have not heard from their office within the next week.   Please try these tips to maintain a healthy lifestyle:  Eat at least 3 REAL meals and 1-2 snacks per day.  Aim for no more than 5 hours between eating.  If you eat breakfast, please do so within one hour of getting up.   Each meal should contain half fruits/vegetables, one quarter protein, and one quarter carbs (no bigger than a computer mouse)  Cut down on sweet beverages. This includes juice, soda, and sweet tea.   Drink at least 1 glass of water with each meal and aim for at least 8 glasses per day  Exercise at least 150 minutes every week.

## 2021-07-01 NOTE — Assessment & Plan Note (Signed)
Will place referral for sleep study. 

## 2021-07-04 ENCOUNTER — Telehealth: Payer: Self-pay

## 2021-07-04 NOTE — Telephone Encounter (Signed)
FYI

## 2021-07-04 NOTE — Telephone Encounter (Signed)
Patient was seen on 07/01/21 and states that she is not getting any better wants to see if there is anything else that could be sent in for her. Please advise.

## 2021-07-04 NOTE — Telephone Encounter (Signed)
Patient has called back in regard.    I have suggested scheduling an appt for tomorrow.  Patient could not work around her schedule for tomorrow.  States she may go to [facility] or ED tonight.    Please follow up in regard.

## 2021-07-05 ENCOUNTER — Other Ambulatory Visit: Payer: Self-pay | Admitting: *Deleted

## 2021-07-05 MED ORDER — AMOXICILLIN-POT CLAVULANATE 875-125 MG PO TABS: 1.0000 | ORAL_TABLET | Freq: Two times a day (BID) | ORAL | 0 refills | Status: DC

## 2021-07-05 NOTE — Telephone Encounter (Signed)
Please check in with patient. Ok to send in augmentin [id number] bid x 10 days if she is still having sinus issues.   Algis Greenhouse. Jerline Pain, MD 07/05/2021 7:50 AM

## 2021-07-05 NOTE — Telephone Encounter (Signed)
Patient return call  Stated still feeling with neck pain Prescription send to pharmacy

## 2021-07-15 ENCOUNTER — Other Ambulatory Visit: Payer: Self-pay

## 2021-07-15 ENCOUNTER — Telehealth (INDEPENDENT_AMBULATORY_CARE_PROVIDER_SITE_OTHER): Payer: BC Managed Care – PPO | Admitting: Family Medicine

## 2021-07-15 VITALS — Ht 67.5 in

## 2021-07-15 DIAGNOSIS — R430 Anosmia: Secondary | ICD-10-CM

## 2021-07-15 DIAGNOSIS — R059 Cough, unspecified: Secondary | ICD-10-CM

## 2021-07-15 MED ORDER — PREDNISONE 50 MG PO TABS: ORAL_TABLET | ORAL | 0 refills | Status: DC

## 2021-07-15 NOTE — Progress Notes (Signed)
° °  Patty [name] is a 51 y.o. female who presents today for a virtual office visit.  Assessment/Plan:  Sinusitis Has not responded adequately to 2 rounds of antibiotics including azithromycin and Augmentin.  We will start prednisone burst and also check CT maxillofacial to further evaluate.  We discussed potential side effects of prednisone.  Depending on response may consider referral to ENT.  We will check a chest x-ray for her cough as well.  We discussed reasons to return to care.  She will check in with me early next week to let me know how symptoms are progressing with prednisone.    Subjective:  HPI:  Patient here for follow up. We saw her a few weeks ago for sinus infection.  Started azithromycin.  Did not have much improvement.  We will try another course of Augmentin.  Symptoms have unfortunately still not improved.  Still has a lot of facial pain and pressure.  She has some swollen lymph nodes.  Still has some cough.  Occasionally has palpitations.      Objective/Observations  Physical Exam: Gen: NAD, resting comfortably Pulm: Normal work of breathing Neuro: Grossly normal, moves all extremities Psych: Normal affect and thought content  Virtual Visit via Video   I connected with Patty [name] on 07/15/21 at  4:00 PM EST by a video enabled telemedicine application and verified that I am speaking with the correct person using two identifiers. The limitations of evaluation and management by telemedicine and the availability of in person appointments were discussed. The patient expressed understanding and agreed to proceed.   Patient location: Home Provider location: [place] participating in the virtual visit: Myself and Patient     Algis Greenhouse. Jerline Pain, MD 07/15/2021 4:32 PM

## 2021-07-18 ENCOUNTER — Encounter: Payer: Self-pay | Admitting: Family Medicine

## 2021-07-18 NOTE — Telephone Encounter (Signed)
I appreciate the update. I am glad she is doing well. Would like for her to let us know if we can be of any further assistance.  Algis Greenhouse. Jerline Pain, MD 07/18/2021 3:51 PM

## 2021-07-29 ENCOUNTER — Encounter: Payer: Self-pay | Admitting: Internal Medicine

## 2021-07-29 ENCOUNTER — Other Ambulatory Visit: Payer: Self-pay

## 2021-07-29 ENCOUNTER — Other Ambulatory Visit: Payer: BC Managed Care – PPO | Admitting: *Deleted

## 2021-07-29 DIAGNOSIS — E782 Mixed hyperlipidemia: Secondary | ICD-10-CM | POA: Diagnosis not present

## 2021-07-29 LAB — LIPID PANEL
Chol/HDL Ratio: 2.6 ratio (ref 0.0–4.4)
Cholesterol, Total: 225 mg/dL — ABNORMAL HIGH (ref 100–199)
HDL: 85 mg/dL (ref >39)
LDL Chol Calc (NIH): 126 mg/dL — ABNORMAL HIGH (ref 0–99)
Triglycerides: 79 mg/dL (ref 0–149)
VLDL Cholesterol Cal: 14 mg/dL (ref 5–40)

## 2021-07-29 LAB — ALT: ALT: 29 IU/L (ref 0–32)

## 2021-08-01 ENCOUNTER — Telehealth: Payer: Self-pay

## 2021-08-01 DIAGNOSIS — E782 Mixed hyperlipidemia: Secondary | ICD-10-CM

## 2021-08-01 DIAGNOSIS — Z789 Other specified health status: Secondary | ICD-10-CM

## 2021-08-01 NOTE — Telephone Encounter (Signed)
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  Let's send her to lipid clinic for a better discussion of options.  Tom- wasn't sure of all the criteria but if you have a FHX of premature CAD and have an Lp(a) of 159 would you meet trial criteria?  I'm trying to give my patients as many options as possible for prevention.  Thanks, MAC   ----- Message ----- From: Interface, [facility] Lab Results In Sent: 07/29/2021   4:40 PM EST To: Werner Lean, MD

## 2021-08-01 NOTE — Telephone Encounter (Signed)
The patient has been notified of the result and verbalized understanding.  All questions (if any) were answered. Precious Gilding, RN 08/01/2021 3:59 PM   Pharm D appointment scheduled for 08/05/31.

## 2021-08-03 HISTORY — PX: TURBINATE REDUCTION: SHX6157

## 2021-08-03 NOTE — Progress Notes (Signed)
Patient ID: Patty [name]                 DOB: 1970-07-01                    MRN: [id number] ? ? ? ? ?HPI: ?Patty [name] is a 51 y.o. female patient referred to lipid clinic by Dr Gasper Sells. PMH is significant for palpitations, HLD, and statin intolerance. Has had normal echo, Zio, and CAC score of 0 on 07/21/20. ? ?Pt presents today for follow up. Experienced joint/muscle pain on low dose rosuvastatin and atorvastatin. Experienced palpitations on Zetia. Muscle and joint pain resolved after statin discontinuation in each case. Still experiencing palpitations. All of her grandparents have a history of ASCVD, either MI or stroke, passed away in their 70s-90s. No heart disease in her parents or siblings that she's aware of. ? 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 ?Exercise: Marathon runner, Ran the [employer] recently ? ?Family History: Heart attack in her maternal grandfather and maternal grandmother; Hypertension in her father.  ? ?Social History: No tobacco or drug use, social alcohol use. ? ?Labs: ?07/29/21: TC 225, TG 79, HDL 85, LDL 126 (off Zetia for 2 weeks before) ?05/23/21: TC 223, TG 81, HDL 99, LDL 110, Lp(a) 159 (had stopped statin a few months before) ? ?Past Medical History:  ?Diagnosis Date  ? Anxiety   ? History of recurrent UTIs   ? Hyperlipidemia   ? Palpitations   ? ? ?Current Outpatient Medications on File Prior to Visit  ?Medication Sig Dispense Refill  ? azelastine (ASTELIN) 0.1 % nasal spray Place 2 sprays into both nostrils 2 (two) times daily. 30 mL 12  ? diclofenac (VOLTAREN) 75 MG EC tablet Take 1 tablet (75 mg total) by mouth 2 (two) times daily. (Patient taking differently: Take 75 mg by mouth as needed for mild pain or moderate pain.) 60 tablet 3  ?  ezetimibe (ZETIA) 10 MG tablet Take 1 tablet (10 mg total) by mouth daily. 90 tablet 3  ? Hyaluronan 88 MG/4ML SOSY Inject 1 syringe into each knee.  Dispense #2 syringes of MONOvisc.  Dx: Primary osteoarthritis of both knees. 8 mL 1  ? Multiple Vitamin (MULTIVITAMIN) tablet Take 1 tablet by mouth daily.     ? predniSONE (DELTASONE) 50 MG tablet Take 1 tablet daily for 5 days. 5 tablet 0  ? ?No current facility-administered medications on file prior to visit.  ? ? ?Allergies  ?Allergen Reactions  ? Rosuvastatin Calcium Other (See Comments)  ?  Joint pain  ? Atorvastatin Other (See Comments)  ?  Joint pain  ? ? ?Assessment/Plan: ? ?1. Hyperlipidemia - LDL 126 a few weeks after stopping Zetia. Her primary risk factor for ASCVD is an elevated Lp(a) and family history of CAD in her grandparents. Would target LDL goal < 100, could also be more aggressive and target < 70 but she did have a calcium score come back at 0 which is reassuring. Discussed rechallenging with low dose pravastatin as well as non-statin options like Nexletol or PCSK9i, although coverage for branded options would be difficult since she is primary prevention with fewer risk factors. She prefers to try pravastatin at 10mg  every other day. Will recheck fasting lipids in 2 months. She'll call clinic with  any tolerability issues before then. ? ?Tomoya Ringwald E. Maitri Schnoebelen, PharmD, BCACP, CPP ?[place] [place], [place] [address] ?Phone: [phone]; Fax: [phone] ?08/04/2021 9:24 AM ? ? ? ?

## 2021-08-04 ENCOUNTER — Other Ambulatory Visit: Payer: Self-pay

## 2021-08-04 ENCOUNTER — Ambulatory Visit: Payer: BC Managed Care – PPO | Admitting: Pharmacist

## 2021-08-04 DIAGNOSIS — E782 Mixed hyperlipidemia: Secondary | ICD-10-CM

## 2021-08-04 DIAGNOSIS — E7841 Elevated Lipoprotein(a): Secondary | ICD-10-CM

## 2021-08-04 MED ORDER — PRAVASTATIN SODIUM 20 MG PO TABS: ORAL_TABLET | ORAL | 11 refills | Status: DC

## 2021-08-04 NOTE — Patient Instructions (Addendum)
Start taking pravastatin 10mg  (1/2 of the 20mg  tablet) every other day. You can increase to daily dosing after a few weeks if you tolerate it well ? ?Recheck fasting cholesterol in 2 months on Monday, May 1st any time after 7:30am ? ?Nexletol 180mg  is a daily pill that is not a statin that is usually well tolerated and lowers your LDL by 20%. IT's currently FDA approved for patients who have established heart disease or genetically high cholesterol ? ?Repatha is a twice monthly subcutaneous injection that lowers your LDL cholesterol by 60% ? ?Call Jinny Blossom, PharmD with any trouble tolerating your medication [phone] ?

## 2021-08-05 DIAGNOSIS — J343 Hypertrophy of nasal turbinates: Secondary | ICD-10-CM | POA: Diagnosis not present

## 2021-08-05 DIAGNOSIS — G44219 Episodic tension-type headache, not intractable: Secondary | ICD-10-CM | POA: Diagnosis not present

## 2021-08-05 DIAGNOSIS — J018 Other acute sinusitis: Secondary | ICD-10-CM | POA: Diagnosis not present

## 2021-08-09 DIAGNOSIS — Z681 Body mass index (BMI) 19 or less, adult: Secondary | ICD-10-CM | POA: Diagnosis not present

## 2021-08-09 DIAGNOSIS — Z01419 Encounter for gynecological examination (general) (routine) without abnormal findings: Secondary | ICD-10-CM | POA: Diagnosis not present

## 2021-08-13 ENCOUNTER — Encounter: Payer: Self-pay | Admitting: Family Medicine

## 2021-08-19 DIAGNOSIS — J343 Hypertrophy of nasal turbinates: Secondary | ICD-10-CM | POA: Diagnosis not present

## 2021-08-23 ENCOUNTER — Other Ambulatory Visit: Payer: BC Managed Care – PPO

## 2021-09-01 ENCOUNTER — Ambulatory Visit: Payer: BC Managed Care – PPO | Admitting: Internal Medicine

## 2021-09-01 ENCOUNTER — Encounter: Payer: Self-pay | Admitting: Internal Medicine

## 2021-09-01 VITALS — BP 108/68 | HR 67 | Ht 67.0 in | Wt 120.0 lb

## 2021-09-01 DIAGNOSIS — E782 Mixed hyperlipidemia: Secondary | ICD-10-CM | POA: Diagnosis not present

## 2021-09-01 DIAGNOSIS — G4730 Sleep apnea, unspecified: Secondary | ICD-10-CM

## 2021-09-01 DIAGNOSIS — I491 Atrial premature depolarization: Secondary | ICD-10-CM | POA: Diagnosis not present

## 2021-09-01 DIAGNOSIS — R002 Palpitations: Secondary | ICD-10-CM

## 2021-09-01 DIAGNOSIS — Z789 Other specified health status: Secondary | ICD-10-CM

## 2021-09-01 NOTE — Progress Notes (Signed)
?Cardiology Office Note:   ? ?Date:  09/01/2021  ? ?ID:  Patty [name], DOB Oct 06, 1970, MRN [id number] ? ?PCP:  Vivi Barrack, MD  ?[facility] Cardiologist:  Rudean Haskell MD ?[facility] Electrophysiologist:  None  ? ?CC: HLD ? ?History of Present Illness:   ? ?Patty [name] is a 51 y.o. female with a hx of Palpitations, HLD who presents for evaluation 07/07/20.  In interim of this visit, patient had normal Echo, Zio, and CAC.Seen 08/12/20.  In interim of this visit, patient ran the [facility]. Has called in with myalgia concerns and follow up for LDL mgmt. ? ?Patients notes that she has had a feeling of altered feelings in her chest. ?Heart heart beats loud; she feels like her heart is turning around in her chest. ?She has a sharp and dull pain that was previously described as costochondritis. ?Feels something funny in your chest. ?This is exacerbated by two factors: she has anxiety about not feeling normal and fear that she will have a cardiac event while running. ? ?She is doing well on low dose pravastatin. She will start running again and is back to cycling. ? ? ? ?Past Medical History:  ?Diagnosis Date  ? Anxiety   ? History of recurrent UTIs   ? Hyperlipidemia   ? Palpitations   ? ? ?Past Surgical History:  ?Procedure Laterality Date  ? COLONOSCOPY  15 years ago   ? in CT-normal  ? ENDOMETRIAL ABLATION  10/16/2013  ? SEPTOPLASTY    ? ? ?Current Medications: ?Current Meds  ?Medication Sig  ? cetirizine-pseudoephedrine (ZYRTEC-D) 5-120 MG tablet Take 1 tablet by mouth as needed for allergies.  ? Hyaluronan 88 MG/4ML SOSY Inject 1 syringe into each knee.  Dispense #2 syringes of MONOvisc.  Dx: Primary osteoarthritis of both knees.  ? Multiple Vitamin (MULTIVITAMIN) tablet Take 1 tablet by mouth daily.   ? pravastatin (PRAVACHOL) 20 MG tablet Take 1/2 tablet every other day to every day by mouth as tolerated.  ? Sodium Hyaluronate (EUFLEXXA) 20 MG/2ML SOSY Inject into the articular  space as needed for pain.  ?  ? ?Allergies:   Rosuvastatin calcium and Atorvastatin  ? ?Social History  ? ?Socioeconomic History  ? Marital status: Married  ?  Spouse name: Not on file  ? Number of children: 2  ? Years of education: Not on file  ? Highest education level: Not on file  ?Occupational History  ? Occupation: Passenger transport manager  ?Tobacco Use  ? Smoking status: Never  ? Smokeless tobacco: Never  ?Vaping Use  ? Vaping Use: Never used  ?Substance and Sexual Activity  ? Alcohol use: No  ?  Comment: Social  beer/wine  ? Drug use: No  ? Sexual activity: Yes  ?  Partners: Male  ?  Birth control/protection: None  ?Other Topics Concern  ? Not on file  ?Social History Narrative  ? Not on file  ? ?Social Determinants of Health  ? ?Financial Resource Strain: Not on file  ?Food Insecurity: Not on file  ?Transportation Needs: Not on file  ?Physical Activity: Not on file  ?Stress: Not on file  ?Social Connections: Not on file  ?  ?Social:  Ihor Austin ran  to the [employer] in 2022 ? ?Family History: ?The patient's family history includes Heart attack in her maternal grandfather and maternal grandmother; Hypertension in her father. There is no history of Colon cancer, Colon polyps, Esophageal cancer, Rectal cancer, or Stomach cancer. ?History of  coronary artery disease notable for maternal and paternal grandfather and grandmother. ?History of heart failure notable for no members. ?No history of cardiomyopathies including hypertrophic cardiomyopathy, left ventricular non-compaction, or arrhythmogenic right ventricular cardiomyopathy. ?History of arrhythmia notable for no members. ?Denies family history of sudden cardiac death including drowning, car accidents, or unexplained deaths in the family. ?No history of bicuspid aortic valve or aortic aneurysm or dissection. ? ? ?ROS:   ?Please see the history of present illness.    ? All other systems reviewed and are negative. ? ?EKGs/Labs/Other Studies Reviewed:    ? ?The following studies were reviewed today: ? ?EKG:   ?09/01/21: SR rate 67 ?05/20/21: Srrate 68 WNL ?07/05/20:  Sinus Bradycardia rate 53; WNL ? ?Cardiac Event Monitoring: ?Date: 08/04/20 ?Results: ?Patient had a minimum heart rate of 44 bpm, maximum heart rate of 182 bpm, and average heart rate of 70 bpm. ?Predominant underlying rhythm was sinus rhythm. ?Two runs of supraventricular tachycardia occurred lasting 6 beats at longest with a max rate of 136 bpm at fastest. ?Isolated PACs were rare (<1.0%), with rare couplets and triplets present. ?Isolated PVCs were rare (<1.0%), with rare couplets present. ?No evidence of complete heart block. ?Triggered and diary events associated with sinus rhythm (algorithrm also reads for SVE but there is no post extra-systolic pause). ?  ?No malignant arrhythmias. ? ? ?Transthoracic Echocardiogram: ?Date:07/16/20 ?Results: ?1. Left ventricular ejection fraction, by estimation, is 55 to 60%. The  ?left ventricle has normal function. The left ventricle has no regional  ?wall motion abnormalities. Left ventricular diastolic parameters were  ?normal.  ? 2. Right ventricular systolic function is normal. The right ventricular  ?size is normal. There is normal pulmonary artery systolic pressure.  ? 3. The mitral valve is normal in structure. Trivial mitral valve  ?regurgitation. No evidence of mitral stenosis.  ? 4. The aortic valve is normal in structure. Aortic valve regurgitation is  ?not visualized. No aortic stenosis is present.  ? 5. The inferior vena cava is normal in size with greater than 50%  ?respiratory variability, suggesting right atrial pressure of 3 mmHg.  ? ?CAC Scan: ?Date: 07/21/20 ?Results: ?IMPRESSION: ?1. Coronary calcium score of 0. This was 1st percentile for age, ?gender, and race matched controls. ?  ? ?Recent Labs: ?01/26/2021: BUN 12; Creat 0.79; Hemoglobin 12.3; Platelets 292; Potassium 4.2; Sodium 139 ?07/29/2021: ALT 29  ?Recent Lipid Panel ?   ?Component Value  Date/Time  ? CHOL 225 (H) 07/29/2021 0816  ? TRIG 79 07/29/2021 0816  ? HDL 85 07/29/2021 0816  ? CHOLHDL 2.6 07/29/2021 0816  ? CHOLHDL 2 06/29/2020 0835  ? VLDL 13.8 06/29/2020 0835  ? [place] 126 (H) 07/29/2021 0816  ? LDLCALC 113 (H) 02/10/2020 1011  ? ? ?Physical Exam:   ? ?VS:  BP 108/68   Pulse 67   Ht '5\' 7"'$  (1.702 m)   Wt 120 lb (54.4 kg)   SpO2 99%   BMI 18.79 kg/m?    ? ?Wt Readings from Last 3 Encounters:  ?09/01/21 120 lb (54.4 kg)  ?07/01/21 120 lb 6.4 oz (54.6 kg)  ?05/23/21 119 lb (54 kg)  ?  ?Gen: NAD ?Neck: No JVD ?Cardiac: No Rubs or Gallops, no Murmur, normal rhythm +2 radial pulses ?Respiratory: Clear to auscultation bilaterally, normal effort, normal  respiratory rate  ?GI: Soft, nontender, non-distended  ?MS: No edema;  moves all extremities ?Integument: Skin feels warm ?Neuro:  At time of evaluation, alert and oriented to person/place/time/situation  ?Psych:  Anxious affect ? ? ?ASSESSMENT:   ? ?1. Palpitations   ?2. Sleep-disordered breathing   ?3. PAC (premature atrial contraction)   ?4. Mixed hyperlipidemia   ?5. Statin intolerance   ? ? ?PLAN:   ? ? ?Hard heard beats ?PACs ?-I do not suspect a cardiac origin as she was having sx through her relatively benign cardiac work up ?- I have offered repeat Ziopatch and PRN AV nodal agents, we discussed the risks of low BP and will defer at this time ?- sleep study is reasonable (pending) ?- no barriers to restarting running ? ? ?Hyperlipidemia (mixed) ?Statin intolerance presently on pravastatin 10 mg Q48 hours (has been cutting it up without symptoms) ?- lipids May 1st ? ?Me in 6 months ? ? ? ? ?Medication Adjustments/Labs and Tests Ordered: ?Current medicines are reviewed at length with the patient today.  Concerns regarding medicines are outlined above.  ?Orders Placed This Encounter  ?Procedures  ? EKG 12-Lead  ? ? ?No orders of the defined types were placed in this encounter. ? ? ? ?Patient Instructions  ?Medication Instructions:  ?Your  physician recommends that you continue on your current medications as directed. Please refer to the Current Medication list given to you today. ? ?*If you need a refill on your cardiac medications before your next

## 2021-09-01 NOTE — Patient Instructions (Signed)
Medication Instructions:  ?Your physician recommends that you continue on your current medications as directed. Please refer to the Current Medication list given to you today. ? ?*If you need a refill on your cardiac medications before your next appointment, please call your pharmacy* ? ? ?Lab Work: ?NONE ?If you have labs (blood work) drawn today and your tests are completely normal, you will receive your results only by: ?MyChart Message (if you have MyChart) OR ?A paper copy in the mail ?If you have any lab test that is abnormal or we need to change your treatment, we will call you to review the results. ? ? ?Testing/Procedures: ?NONE ? ? ?Follow-Up: ?At [facility], you and your health needs are our priority.  As part of our continuing mission to provide you with exceptional heart care, we have created designated Provider Care Teams.  These Care Teams include your primary Cardiologist (physician) and Advanced Practice Providers (APPs -  Physician Assistants and Nurse Practitioners) who all work together to provide you with the care you need, when you need it. ? ? ?Your next appointment:   ?6 month(s) ? ?The format for your next appointment:   ?In Person ? ?Provider:   ?Werner Lean, MD   ?  ?

## 2021-09-12 ENCOUNTER — Ambulatory Visit: Payer: BC Managed Care – PPO | Admitting: Neurology

## 2021-09-12 ENCOUNTER — Encounter: Payer: Self-pay | Admitting: Neurology

## 2021-09-12 VITALS — BP 103/61 | HR 69 | Ht 67.0 in | Wt 121.0 lb

## 2021-09-12 DIAGNOSIS — G4733 Obstructive sleep apnea (adult) (pediatric): Secondary | ICD-10-CM | POA: Diagnosis not present

## 2021-09-12 DIAGNOSIS — I471 Supraventricular tachycardia: Secondary | ICD-10-CM

## 2021-09-12 DIAGNOSIS — R002 Palpitations: Secondary | ICD-10-CM

## 2021-09-12 DIAGNOSIS — R0689 Other abnormalities of breathing: Secondary | ICD-10-CM | POA: Diagnosis not present

## 2021-09-12 NOTE — Patient Instructions (Signed)

## 2021-09-12 NOTE — Progress Notes (Signed)
Subjective:  ?  ?Patient ID: TEIGEN PARSLOW is a 51 y.o. female. ? ? ? ? ?Star Age, MD, PhD ?[facility] ?[address], [address] ?[phone] ?[place], [place] [address] ? ? ?Dear Dr. Jerline Pain,  ? ?I saw your patient, Winry Egnew, upon your kind request in my sleep clinic today for initial consultation of her sleep disorder, in particular, evaluation of her prior diagnosis of obstructive sleep apnea.  The patient is accompanied by her teenage daughter today.  As you know, Ms. Laseter is a 51 year old right-handed woman with an underlying medical history of palpitations, hyperlipidemia, sinusitis with recent status post inferior turbinate reduction last month, recurrent UTIs, and anxiety, who reports a prior diagnosis of mild obstructive sleep apnea several years ago.  Testing was over 15 years ago and prior sleep study results are not available for my review today.  I reviewed your office note from 07/01/2021.  She has recently seen cardiology for her palpitations.  She had inferior turbinate reduction at [facility] on 08/19/2021 under Dr. Hassell Done. She has seen cardiology and had w/u for her palpitations.  A 2-week heart monitor showed a couple of episodes of PSVT, no evidence of A-fib.  She does wake up gasping for air at times, her breathing has improved since her turbinate reduction.  She has a history of snoring which can be disturbing to her husband.  She has no family history of sleep apnea.  Her Epworth sleepiness score is 4 out of 24, fatigue severity score is 42 out of 63.  She lives with her family which includes husband and 2 children.  She works for a [employer] in [employer], community.  She is a non-smoker and does not currently drink any alcohol, limits her caffeine to 2 cups of coffee per day.  Bedtime is generally around 11 or 11:30 PM and rise time around 7.  She has no night to night nocturia or recurrent morning headaches.   ? ?Her Past Medical  History Is Significant For: ?Past Medical History:  ?Diagnosis Date  ? Anxiety   ? History of recurrent UTIs   ? Hyperlipidemia   ? Palpitations   ? Sinus trouble   ? ? ?Her Past Surgical History Is Significant For: ?Past Surgical History:  ?Procedure Laterality Date  ? COLONOSCOPY  15 years ago   ? in CT-normal  ? ENDOMETRIAL ABLATION  10/16/2013  ? SEPTOPLASTY    ? 84 and 2000  ? TURBINATE REDUCTION  08/2021  ? ? ?Her Family History Is Significant For: ?Family History  ?Problem Relation Age of Onset  ? Hypertension Father   ? Heart attack Maternal Grandmother   ? Heart attack Maternal Grandfather   ? High Cholesterol Other   ? Colon cancer Neg Hx   ? Colon polyps Neg Hx   ? Esophageal cancer Neg Hx   ? Rectal cancer Neg Hx   ? Stomach cancer Neg Hx   ? Sleep apnea Neg Hx   ? ? ?Her Social History Is Significant For: ?Social History  ? ?Socioeconomic History  ? Marital status: Married  ?  Spouse name: Not on file  ? Number of children: 2  ? Years of education: Not on file  ? Highest education level: Master's degree (e.g., MA, MS, MEng, MEd, MSW, MBA)  ?Occupational History  ? Occupation: Passenger transport manager  ?Tobacco Use  ? Smoking status: Never  ? Smokeless tobacco: Never  ?Vaping Use  ? Vaping Use: Never used  ?  Substance and Sexual Activity  ? Alcohol use: Never  ?  Comment: Social  beer/wine  ? Drug use: Never  ? Sexual activity: Yes  ?  Partners: Male  ?  Birth control/protection: None  ?Other Topics Concern  ? Not on file  ?Social History Narrative  ? Lives at home with husband and 2 children  ? Caffeine: 2 cups/day  ? ?Social Determinants of Health  ? ?Financial Resource Strain: Not on file  ?Food Insecurity: Not on file  ?Transportation Needs: Not on file  ?Physical Activity: Not on file  ?Stress: Not on file  ?Social Connections: Not on file  ? ? ?Her Allergies Are:  ?Allergies  ?Allergen Reactions  ? Rosuvastatin Calcium Other (See Comments)  ?  Joint pain  ? Atorvastatin Other (See Comments)  ?  Joint pain   ?:  ? ?Her Current Medications Are:  ?Outpatient Encounter Medications as of 09/12/2021  ?Medication Sig  ? Hyaluronan 88 MG/4ML SOSY Inject 1 syringe into each knee.  Dispense #2 syringes of MONOvisc.  Dx: Primary osteoarthritis of both knees.  ? Multiple Vitamin (MULTIVITAMIN) tablet Take 1 tablet by mouth daily.   ? pravastatin (PRAVACHOL) 20 MG tablet Take 1/2 tablet every other day to every day by mouth as tolerated. (Patient taking differently: Take 5 mg by mouth every other day. Take 1/2 tablet every other day to every day by mouth as tolerated.)  ? Sodium Hyaluronate (EUFLEXXA) 20 MG/2ML SOSY Inject into the articular space as needed for pain.  ? cetirizine-pseudoephedrine (ZYRTEC-D) 5-120 MG tablet Take 1 tablet by mouth as needed for allergies. (Patient not taking: Reported on 09/12/2021)  ? ?No facility-administered encounter medications on file as of 09/12/2021.  ?: ? ? ?Review of Systems:  ?Out of a complete 14 point review of systems, all are reviewed and negative with the exception of these symptoms as listed below: ? ?Review of Systems  ?Neurological:   ?     Patient is here with her daughter for sleep consult. She states approximately 20-25 years ago she had a sleep study and was diagnosed with mild OSA but CPAP wasn't warranted per provider. She has been working with ENT and recently had a turbinate reduction. It helped the gasping for breath. She still would like to have the sleep study repeated. Would like one at home.  ESS 4, FSS 42.  ? ?Objective:  ?Neurological Exam ? ?Physical Exam ?Physical Examination:  ? ?Vitals:  ? 09/12/21 1427  ?BP: 103/61  ?Pulse: 69  ? ? ?General Examination: The patient is a very pleasant 51 y.o. female in no acute distress. She appears well-developed and well-nourished and well groomed.  ? ?HEENT: Normocephalic, atraumatic, pupils are equal, round and reactive to light, extraocular tracking is good without limitation to gaze excursion or nystagmus noted. Hearing is  grossly intact. Face is symmetric with normal facial animation. Speech is clear with no dysarthria noted. There is no hypophonia. There is no lip, neck/head, jaw or voice tremor. Neck is supple with full range of passive and active motion. There are no carotid bruits on auscultation. Oropharynx exam reveals: mild mouth dryness, good dental hygiene and mild airway crowding, due to small airway entry, tonsillar size of 1+, Mallampati class II.  Neck circumference of 12-1/2 inches.  She has a minimal overbite.  Tongue protrudes centrally and palate elevates symmetrically.  Nasal inspection reveals benign nasal passages, no deviated septum.  ? ?Chest: Clear to auscultation without wheezing, rhonchi or crackles noted. ? ?  Heart: S1+S2+0, regular and normal without murmurs, rubs or gallops noted.  ? ?Abdomen: Soft, non-tender and non-distended. ? ?Extremities: There is no obvious edema.   ? ?Skin: Warm and dry without trophic changes noted.  ? ?Musculoskeletal: exam reveals no obvious joint deformities.  ? ?Neurologically:  ?Mental status: The patient is awake, alert and oriented in all 4 spheres. Her immediate and remote memory, attention, language skills and fund of knowledge are appropriate. There is no evidence of aphasia, agnosia, apraxia or anomia. Speech is clear with normal prosody and enunciation. Thought process is linear. Mood is normal and affect is normal.  ?Cranial nerves II - XII are as described above under HEENT exam.  ?Motor exam: Normal bulk, strength and tone is noted. There is no obvious tremor. Fine motor skills and coordination: grossly intact.  ?Cerebellar testing: No dysmetria or intention tremor. There is no truncal or gait ataxia.  ?Sensory exam: intact to light touch in the upper and lower extremities.  ?Gait, station and balance: She stands easily. No veering to one side is noted. No leaning to one side is noted. Posture is age-appropriate and stance is narrow based. Gait shows normal stride  length and normal pace. No problems turning are noted.  ? ?Assessment and Plan:  ?In summary, CHIRSTY ARMISTEAD is a very pleasant 51 y.o.-year old female with an underlying medical history of palpitatio

## 2021-09-13 ENCOUNTER — Institutional Professional Consult (permissible substitution): Payer: Self-pay | Admitting: Neurology

## 2021-10-03 ENCOUNTER — Other Ambulatory Visit: Payer: BC Managed Care – PPO | Admitting: *Deleted

## 2021-10-03 DIAGNOSIS — E782 Mixed hyperlipidemia: Secondary | ICD-10-CM

## 2021-10-03 LAB — HEPATIC FUNCTION PANEL
ALT: 28 IU/L (ref 0–32)
AST: 36 IU/L (ref 0–40)
Albumin: 4.7 g/dL (ref 3.8–4.9)
Alkaline Phosphatase: 69 IU/L (ref 44–121)
Bilirubin Total: 0.7 mg/dL (ref 0.0–1.2)
Bilirubin, Direct: 0.15 mg/dL (ref 0.00–0.40)
Total Protein: 6.7 g/dL (ref 6.0–8.5)

## 2021-10-03 LAB — LIPID PANEL
Chol/HDL Ratio: 2.4 ratio (ref 0.0–4.4)
Cholesterol, Total: 212 mg/dL — ABNORMAL HIGH (ref 100–199)
HDL: 88 mg/dL (ref >39)
LDL Chol Calc (NIH): 112 mg/dL — ABNORMAL HIGH (ref 0–99)
Triglycerides: 68 mg/dL (ref 0–149)
VLDL Cholesterol Cal: 12 mg/dL (ref 5–40)

## 2021-10-05 ENCOUNTER — Encounter: Payer: Self-pay | Admitting: Internal Medicine

## 2021-10-05 DIAGNOSIS — E782 Mixed hyperlipidemia: Secondary | ICD-10-CM

## 2021-10-28 ENCOUNTER — Telehealth: Payer: Self-pay | Admitting: Neurology

## 2021-10-28 NOTE — Telephone Encounter (Signed)
HST- [employer] no auth req spoke to Mayfield L ref # [id number]. Patient is scheduled to pick up at [facility] on 11/07/21 at 8:30 AM.

## 2021-11-07 ENCOUNTER — Ambulatory Visit: Payer: BC Managed Care – PPO | Admitting: Neurology

## 2021-11-07 DIAGNOSIS — G4733 Obstructive sleep apnea (adult) (pediatric): Secondary | ICD-10-CM | POA: Diagnosis not present

## 2021-11-07 DIAGNOSIS — I471 Supraventricular tachycardia: Secondary | ICD-10-CM

## 2021-11-07 DIAGNOSIS — R0683 Snoring: Secondary | ICD-10-CM

## 2021-11-07 DIAGNOSIS — R0689 Other abnormalities of breathing: Secondary | ICD-10-CM

## 2021-11-07 DIAGNOSIS — R002 Palpitations: Secondary | ICD-10-CM

## 2021-11-10 ENCOUNTER — Telehealth: Payer: Self-pay | Admitting: *Deleted

## 2021-11-10 ENCOUNTER — Encounter: Payer: Self-pay | Admitting: *Deleted

## 2021-11-10 NOTE — Progress Notes (Signed)
See procedure note.

## 2021-11-10 NOTE — Telephone Encounter (Signed)
Spoke to patient Gave Sleep study results . Patient asked to send results through mychart . Will send today Pt thanked me for calling  Sent sleep study result to the referring Physician(Caleb Parker,MD)

## 2021-11-10 NOTE — Telephone Encounter (Signed)
-----   Message from Star Age, MD sent at 11/10/2021  2:41 PM EDT ----- Patient referred by Dr. Jerline Pain for evaluation of a prior diagnosis of mild sleep apnea.  I saw her on 09/12/2021 and she had a home sleep test on 11/07/2021.  Please call and notify the patient that the recent home sleep test did not show any significant obstructive sleep apnea. Patient can follow up with the referring provider.   Once you have spoken to patient, you can close this encounter.   Thanks,  Star Age, MD, PhD [facility])

## 2021-11-10 NOTE — Procedures (Signed)
   [facility]  HOME SLEEP TEST (Watch PAT) REPORT  STUDY DATE: 11/07/2021  DOB: 1970-10-20  MRN: [id number]  ORDERING CLINICIAN: Star Age, MD, PhD   REFERRING CLINICIAN: Vivi Barrack, MD   CLINICAL INFORMATION/HISTORY: 51 year old right-handed woman with an underlying medical history of palpitations, hyperlipidemia, sinusitis with recent status post inferior turbinate reduction, who reports a prior diagnosis of mild obstructive sleep apnea several years ago. She does wake up gasping for air at times, her breathing has improved since her turbinate reduction.  She presents for reevaluation.  Epworth sleepiness score: 4/24.  BMI: 19 kg/m  FINDINGS:   Sleep Summary:   Total Recording Time (hours, min): 7 hours, 15 minutes  Total Sleep Time (hours, min):  5 hours, 22 minutes   Percent REM (%):    5.1%   Respiratory Indices:   Calculated pAHI (per hour):  2.2/hour         REM pAHI:    N/A       NREM pAHI: N/A  Oxygen Saturation Statistics:    Oxygen Saturation (%) Mean: 96%   Minimum oxygen saturation (%):                 84%   O2 Saturation Range (%): 84-100%    O2 Saturation (minutes) <=88%: 0 min  Pulse Rate Statistics:   Pulse Mean (bpm):    54/min    Pulse Range (42-84/min)   IMPRESSION: Primary snoring   RECOMMENDATION:  This home sleep test does not demonstrate any significant obstructive or central sleep disordered breathing, with an AHI of 2.2/h, O2 nadir 84% (briefly, once). Some snoring was noted and appeared to be intermittent, in the mild to moderate range.  Treatment with positive airway pressure such as CPAP or AutoPap is not indicated.  Other causes of the patient's symptoms, including circadian rhythm disturbances, an underlying mood disorder, medication effect and/or an underlying medical problem cannot be ruled out based on this test. Clinical correlation is recommended. The patient should be cautioned not to drive, work  at heights, or operate dangerous or heavy equipment when tired or sleepy. Review and reiteration of good sleep hygiene measures should be pursued with any patient. The patient can follow up with her referring provider, who will be notified of the test results.   I certify that I have reviewed the raw data recording prior to the issuance of this report in accordance with the standards of the American Academy of Sleep Medicine (AASM).  INTERPRETING PHYSICIAN:   Star Age, MD, PhD  Board Certified in Neurology and Sleep Medicine  [facility] [address], [place], [place] [address] [phone]

## 2022-01-09 ENCOUNTER — Other Ambulatory Visit: Payer: BC Managed Care – PPO

## 2022-01-12 ENCOUNTER — Other Ambulatory Visit: Payer: BC Managed Care – PPO

## 2022-01-20 ENCOUNTER — Other Ambulatory Visit: Payer: BC Managed Care – PPO

## 2022-01-20 DIAGNOSIS — E782 Mixed hyperlipidemia: Secondary | ICD-10-CM | POA: Diagnosis not present

## 2022-01-20 LAB — LIPID PANEL
Chol/HDL Ratio: 2.4 ratio (ref 0.0–4.4)
Cholesterol, Total: 186 mg/dL (ref 100–199)
HDL: 79 mg/dL (ref >39)
LDL Chol Calc (NIH): 94 mg/dL (ref 0–99)
Triglycerides: 69 mg/dL (ref 0–149)
VLDL Cholesterol Cal: 13 mg/dL (ref 5–40)

## 2022-01-20 LAB — ALT: ALT: 21 IU/L (ref 0–32)

## 2022-02-01 DIAGNOSIS — N644 Mastodynia: Secondary | ICD-10-CM | POA: Diagnosis not present

## 2022-02-27 ENCOUNTER — Encounter: Payer: Self-pay | Admitting: *Deleted

## 2022-02-28 NOTE — Progress Notes (Unsigned)
Cardiology Office Note:    Date:  03/01/2022   ID:  Patty [name], DOB [birth year], MRN [id number]  PCP:  Vivi Barrack, MD  [facility] Cardiologist:  Rudean Haskell MD [place] Electrophysiologist:  None   CC: HLD  History of Present Illness:    EDLA PARA is a 51 y.o. female with a hx of Palpitations, HLD who presents for evaluation 07/07/20.   2022: In interim of this visit, patient had normal Echo, Zio, and CAC. In interim of this visit, patient ran the [facility].  2023: Has called in with myalgia concerns and follow up for LDL mgmt.Tolerated low dose pravastatin every other day. Had normal sleep study.  Patient notes that she is doing well.   Moved her oldest to [employer].  Since last visit notes  she is back to running; she will do the [employer] . There are no interval hospital/ED visit.   Is able to tolerate 10 mg q48 hours.   Still had chest pain..  No SOB/DOE and no PND/Orthopnea.  No weight gain or leg swelling.  No palpitations or syncope .  Past Medical History:  Diagnosis Date   Anxiety    History of recurrent UTIs    Hyperlipidemia    Palpitations    Sinus trouble     Past Surgical History:  Procedure Laterality Date   COLONOSCOPY  15 years ago    in CT-normal   ENDOMETRIAL ABLATION  10/16/2013   SEPTOPLASTY     1998 and 2000   TURBINATE REDUCTION  08/2021    Current Medications: Current Meds  Medication Sig   cetirizine-pseudoephedrine (ZYRTEC-D) 5-120 MG tablet Take 1 tablet by mouth as needed for allergies.   Hyaluronan 88 MG/4ML SOSY Inject 1 syringe into each knee.  Dispense #2 syringes of MONOvisc.  Dx: Primary osteoarthritis of both knees.   Multiple Vitamin (MULTIVITAMIN) tablet Take 1 tablet by mouth daily.    pravastatin (PRAVACHOL) 20 MG tablet Take 1/2 tablet every other day to every day by mouth as tolerated. (Patient taking differently: Take 10 mg by mouth every other day. Take 1/2 tablet every other  day to every day by mouth as tolerated.)   Sodium Hyaluronate (EUFLEXXA) 20 MG/2ML SOSY Inject into the articular space as needed for pain.     Allergies:   Rosuvastatin calcium and Atorvastatin   Social History   Socioeconomic History   Marital status: Married    Spouse name: Not on file   Number of children: 2   Years of education: Not on file   Highest education level: Master's degree (e.g., MA, MS, MEng, MEd, MSW, MBA)  Occupational History   Occupation: Passenger transport manager  Tobacco Use   Smoking status: Never   Smokeless tobacco: Never  Vaping Use   Vaping Use: Never used  Substance and Sexual Activity   Alcohol use: Never    Comment: Social  beer/wine   Drug use: Never   Sexual activity: Yes    Partners: Male    Birth control/protection: None  Other Topics Concern   Not on file  Social History Narrative   Lives at home with husband and 2 children   Caffeine: 2 cups/day   Social Determinants of Health   Financial Resource Strain: Not on file  Food Insecurity: Not on file  Transportation Needs: Not on file  Physical Activity: Not on file  Stress: Not on file  Social Connections: Not on file    Social:  Marathon  Runner ran  to the [employer] in 2022, moved oldest to college recently  Family History: The patient's family history includes Heart attack in her maternal grandfather and maternal grandmother; High Cholesterol in an other family member; Hypertension in her father. There is no history of Colon cancer, Colon polyps, Esophageal cancer, Rectal cancer, Stomach cancer, or Sleep apnea. History of coronary artery disease notable for maternal and paternal grandfather and grandmother. History of heart failure notable for no members. No history of cardiomyopathies including hypertrophic cardiomyopathy, left ventricular non-compaction, or arrhythmogenic right ventricular cardiomyopathy. History of arrhythmia notable for no members. Denies family history of sudden  cardiac death including drowning, car accidents, or unexplained deaths in the family. No history of bicuspid aortic valve or aortic aneurysm or dissection.   ROS:   Please see the history of present illness.     All other systems reviewed and are negative.  EKGs/Labs/Other Studies Reviewed:    The following studies were reviewed today:  EKG:   03/01/22: Sinus bradycardia rate 50 09/01/21: SR rate 67 05/20/21: SR rate 68 WNL 07/05/20:  Sinus Bradycardia rate 53; WNL  Cardiac Event Monitoring: Date: 08/04/20 Results: Patient had a minimum heart rate of 44 bpm, maximum heart rate of 182 bpm, and average heart rate of 70 bpm. Predominant underlying rhythm was sinus rhythm. Two runs of supraventricular tachycardia occurred lasting 6 beats at longest with a max rate of 136 bpm at fastest. Isolated PACs were rare (<1.0%), with rare couplets and triplets present. Isolated PVCs were rare (<1.0%), with rare couplets present. No evidence of complete heart block. Triggered and diary events associated with sinus rhythm (algorithrm also reads for SVE but there is no post extra-systolic pause).   No malignant arrhythmias.   Transthoracic Echocardiogram: Date:07/16/20 Results: 1. Left ventricular ejection fraction, by estimation, is 55 to 60%. The  left ventricle has normal function. The left ventricle has no regional  wall motion abnormalities. Left ventricular diastolic parameters were  normal.   2. Right ventricular systolic function is normal. The right ventricular  size is normal. There is normal pulmonary artery systolic pressure.   3. The mitral valve is normal in structure. Trivial mitral valve  regurgitation. No evidence of mitral stenosis.   4. The aortic valve is normal in structure. Aortic valve regurgitation is  not visualized. No aortic stenosis is present.   5. The inferior vena cava is normal in size with greater than 50%  respiratory variability, suggesting right atrial  pressure of 3 mmHg.   CAC Scan: Date: 07/21/20 Results: IMPRESSION: 1. Coronary calcium score of 0. This was 1st percentile for age, gender, and race matched controls.    Recent Labs: 01/20/2022: ALT 21  Recent Lipid Panel    Component Value Date/Time   CHOL 186 01/20/2022 0856   TRIG 69 01/20/2022 0856   HDL 79 01/20/2022 0856   CHOLHDL 2.4 01/20/2022 0856   CHOLHDL 2 06/29/2020 0835   VLDL 13.8 06/29/2020 0835   LDLCALC 94 01/20/2022 0856   LDLCALC 113 (H) 02/10/2020 1011    Physical Exam:    VS:  BP (!) 102/58   Pulse (!) 50   Ht 5' 7.5" (1.715 m)   Wt 122 lb (55.3 kg)   SpO2 99%   BMI 18.83 kg/m     Wt Readings from Last 3 Encounters:  03/01/22 122 lb (55.3 kg)  09/12/21 121 lb (54.9 kg)  09/01/21 120 lb (54.4 kg)    Gen: NAD Neck: No JVD  Cardiac: No Rubs or Gallops, no murmur, normal rhythm +2 radial pulses Respiratory: Clear to auscultation bilaterally, normal effort, normal  respiratory rate  GI: Soft, nontender, non-distended  MS: No edema;  moves all extremities Integument: Skin feels warm Neuro:  At time of evaluation, alert and oriented to person/place/time/situation  Psych: Anxious affect  ASSESSMENT:    1. Palpitations   2. PAC (premature atrial contraction)   3. Chest pain of uncertain etiology     PLAN:    Chest pain - - zero calcium score; low risk of cardiac events - in the event she is having symptoms with marathon training we will do stress echo for ischemia prior to her big race  Hyperlipidemia (mixed) Statin intolerance presently on pravastatin 10 mg Q48 hours (has been cutting it up without symptoms) - LDL < 100, continue; will recheck in six months; has seen lipid clinic in the past  PACs - no plans for PRN AV nodal agents   If sx return will see in January She can post point this until after [place] if no symptoms.     Medication Adjustments/Labs and Tests Ordered: Current medicines are reviewed at  length with the patient today.  Concerns regarding medicines are outlined above.  Orders Placed This Encounter  Procedures   EKG 12-Lead    No orders of the defined types were placed in this encounter.    Patient Instructions  Medication Instructions:  Your physician recommends that you continue on your current medications as directed. Please refer to the Current Medication list given to you today.  *If you need a refill on your cardiac medications before your next appointment, please call your pharmacy*   Lab Work: NONE If you have labs (blood work) drawn today and your tests are completely normal, you will receive your results only by: [place] (if you have MyChart) OR A paper copy in the mail If you have any lab test that is abnormal or we need to change your treatment, we will call you to review the results.   Testing/Procedures: NONE   Follow-Up: At [facility], you and your health needs are our priority.  As part of our continuing mission to provide you with exceptional heart care, we have created designated Provider Care Teams.  These Care Teams include your primary Cardiologist (physician) and Advanced Practice Providers (APPs -  Physician Assistants and Nurse Practitioners) who all work together to provide you with the care you need, when you need it.   Your next appointment:   4 month(s)  The format for your next appointment:   In Person  Provider:   Werner Lean, MD      Important Information About Sugar         Signed, Werner Lean, MD  03/01/2022 9:58 AM    [place]

## 2022-03-01 ENCOUNTER — Encounter: Payer: Self-pay | Admitting: Internal Medicine

## 2022-03-01 ENCOUNTER — Ambulatory Visit: Payer: BC Managed Care – PPO | Attending: Internal Medicine | Admitting: Internal Medicine

## 2022-03-01 VITALS — BP 102/58 | HR 50 | Ht 67.5 in | Wt 122.0 lb

## 2022-03-01 DIAGNOSIS — R002 Palpitations: Secondary | ICD-10-CM

## 2022-03-01 DIAGNOSIS — I491 Atrial premature depolarization: Secondary | ICD-10-CM | POA: Diagnosis not present

## 2022-03-01 DIAGNOSIS — R079 Chest pain, unspecified: Secondary | ICD-10-CM | POA: Diagnosis not present

## 2022-03-01 NOTE — Patient Instructions (Signed)
Medication Instructions:  Your physician recommends that you continue on your current medications as directed. Please refer to the Current Medication list given to you today.  *If you need a refill on your cardiac medications before your next appointment, please call your pharmacy*   Lab Work: NONE If you have labs (blood work) drawn today and your tests are completely normal, you will receive your results only by: [employer] (if you have MyChart) OR A paper copy in the mail If you have any lab test that is abnormal or we need to change your treatment, we will call you to review the results.   Testing/Procedures: NONE   Follow-Up: At [facility], you and your health needs are our priority.  As part of our continuing mission to provide you with exceptional heart care, we have created designated Provider Care Teams.  These Care Teams include your primary Cardiologist (physician) and Advanced Practice Providers (APPs -  Physician Assistants and Nurse Practitioners) who all work together to provide you with the care you need, when you need it.   Your next appointment:   4 month(s)  The format for your next appointment:   In Person  Provider:   Werner Lean, MD      Important Information About Sugar

## 2022-03-13 ENCOUNTER — Encounter: Payer: Self-pay | Admitting: Sports Medicine

## 2022-03-13 ENCOUNTER — Other Ambulatory Visit: Payer: Self-pay | Admitting: Family Medicine

## 2022-03-13 NOTE — Telephone Encounter (Signed)
FYI Christal, do I just need to send in the prescription for it?  Or do we need to do the approval process?

## 2022-03-15 ENCOUNTER — Telehealth: Payer: Self-pay

## 2022-03-15 NOTE — Telephone Encounter (Signed)
Benefits Investigation Details received from [employer] [facility]jection: Monovisc  Medical: Deductible does not apply. Once the OOP has been met, pt is covered at 100%. Both the drug and the procedure are covered at 100%. If OV is billed, a $70 copay will apply.  PA required: Yes PA submitted online at [email or web address]    Pharmacy: Product not covered under the pharmacy plan.   Specialty Pharmacy: [facility]  May fill through: Specialty Pharmacy OV Copay/Coinsurance:  Product Copay:  Administration Coinsurance:  Administration Copay:  Deductible:  Out of Pocket Max: $3000 (met: [id number])  $20.52  Patient will be responsible for the remaining OOP expense but is then covered at 100%.   Awaiting PA from [facility].

## 2022-03-16 ENCOUNTER — Other Ambulatory Visit: Payer: Self-pay

## 2022-03-16 DIAGNOSIS — M170 Bilateral primary osteoarthritis of knee: Secondary | ICD-10-CM

## 2022-03-16 MED ORDER — HYALURONAN 88 MG/4ML IX SOSY: PREFILLED_SYRINGE | INTRA_ARTICULAR | 1 refills | Status: DC

## 2022-03-20 ENCOUNTER — Encounter: Payer: Self-pay | Admitting: Internal Medicine

## 2022-03-28 ENCOUNTER — Ambulatory Visit (INDEPENDENT_AMBULATORY_CARE_PROVIDER_SITE_OTHER): Payer: BC Managed Care – PPO | Admitting: Family Medicine

## 2022-03-28 VITALS — BP 105/64 | HR 60 | Temp 97.5°F | Ht 67.5 in | Wt 119.6 lb

## 2022-03-28 DIAGNOSIS — E782 Mixed hyperlipidemia: Secondary | ICD-10-CM | POA: Diagnosis not present

## 2022-03-28 DIAGNOSIS — M170 Bilateral primary osteoarthritis of knee: Secondary | ICD-10-CM | POA: Diagnosis not present

## 2022-03-28 DIAGNOSIS — R739 Hyperglycemia, unspecified: Secondary | ICD-10-CM

## 2022-03-28 DIAGNOSIS — Z0001 Encounter for general adult medical examination with abnormal findings: Secondary | ICD-10-CM | POA: Diagnosis not present

## 2022-03-28 DIAGNOSIS — M940 Chondrocostal junction syndrome [Tietze]: Secondary | ICD-10-CM | POA: Diagnosis not present

## 2022-03-28 DIAGNOSIS — Z1159 Encounter for screening for other viral diseases: Secondary | ICD-10-CM | POA: Diagnosis not present

## 2022-03-28 DIAGNOSIS — Z114 Encounter for screening for human immunodeficiency virus [HIV]: Secondary | ICD-10-CM

## 2022-03-28 MED ORDER — PREDNISONE 50 MG PO TABS: ORAL_TABLET | ORAL | 0 refills | Status: DC

## 2022-03-28 MED ORDER — DICLOFENAC SODIUM 75 MG PO TBEC: 75.0000 mg | DELAYED_RELEASE_TABLET | Freq: Two times a day (BID) | ORAL | 0 refills | Status: DC

## 2022-03-28 NOTE — Patient Instructions (Signed)
It was very nice to see you today!  Please start the prednisone.  You can also take the diclofenac.  We will check blood work today.  Please continue to work on diet and exercise.  I will see you back in a year.  Please come back to see [place] sooner if needed.  Take care, Dr Jerline Pain  PLEASE NOTE:  If you had any lab tests please let us know if you have not heard back within a few days. You may see your results on mychart before we have a chance to review them but we will give you a call once they are reviewed by [place]. If we ordered any referrals today, please let us know if you have not heard from their office within the next week.   Please try these tips to maintain a healthy lifestyle:  Eat at least 3 REAL meals and 1-2 snacks per day.  Aim for no more than 5 hours between eating.  If you eat breakfast, please do so within one hour of getting up.   Each meal should contain half fruits/vegetables, one quarter protein, and one quarter carbs (no bigger than a computer mouse)  Cut down on sweet beverages. This includes juice, soda, and sweet tea.   Drink at least 1 glass of water with each meal and aim for at least 8 glasses per day  Exercise at least 150 minutes every week.    Preventive Care 51 Years Old, Female Preventive care refers to lifestyle choices and visits with your health care provider that can promote health and wellness. Preventive care visits are also called wellness exams. What can I expect for my preventive care visit? Counseling Your health care provider may ask you questions about your: Medical history, including: Past medical problems. Family medical history. Pregnancy history. Current health, including: Menstrual cycle. Method of birth control. Emotional well-being. Home life and relationship well-being. Sexual activity and sexual health. Lifestyle, including: Alcohol, nicotine or tobacco, and drug use. Access to firearms. Diet, exercise, and sleep  habits. Work and work Statistician. Sunscreen use. Safety issues such as seatbelt and bike helmet use. Physical exam Your health care provider will check your: Height and weight. These may be used to calculate your BMI (body mass index). BMI is a measurement that tells if you are at a healthy weight. Waist circumference. This measures the distance around your waistline. This measurement also tells if you are at a healthy weight and may help predict your risk of certain diseases, such as type 2 diabetes and high blood pressure. Heart rate and blood pressure. Body temperature. Skin for abnormal spots. What immunizations do I need?  Vaccines are usually given at various ages, according to a schedule. Your health care provider will recommend vaccines for you based on your age, medical history, and lifestyle or other factors, such as travel or where you work. What tests do I need? Screening Your health care provider may recommend screening tests for certain conditions. This may include: Lipid and cholesterol levels. Diabetes screening. This is done by checking your blood sugar (glucose) after you have not eaten for a while (fasting). Pelvic exam and Pap test. Hepatitis B test. Hepatitis C test. HIV (human immunodeficiency virus) test. STI (sexually transmitted infection) testing, if you are at risk. Lung cancer screening. Colorectal cancer screening. Mammogram. Talk with your health care provider about when you should start having regular mammograms. This may depend on whether you have a family history of breast cancer. BRCA-related cancer  screening. This may be done if you have a family history of breast, ovarian, tubal, or peritoneal cancers. Bone density scan. This is done to screen for osteoporosis. Talk with your health care provider about your test results, treatment options, and if necessary, the need for more tests. Follow these instructions at home: Eating and drinking  Eat a diet  that includes fresh fruits and vegetables, whole grains, lean protein, and low-fat dairy products. Take vitamin and mineral supplements as recommended by your health care provider. Do not drink alcohol if: Your health care provider tells you not to drink. You are pregnant, may be pregnant, or are planning to become pregnant. If you drink alcohol: Limit how much you have to 0-1 drink a day. Know how much alcohol is in your drink. In the [place] one drink equals one 12 oz bottle of beer (355 mL), one 5 oz glass of wine (148 mL), or one 1 oz glass of hard liquor (44 mL). Lifestyle Brush your teeth every morning and night with fluoride toothpaste. Floss one time each day. Exercise for at least 30 minutes 5 or more days each week. Do not use any products that contain nicotine or tobacco. These products include cigarettes, chewing tobacco, and vaping devices, such as e-cigarettes. If you need help quitting, ask your health care provider. Do not use drugs. If you are sexually active, practice safe sex. Use a condom or other form of protection to prevent STIs. If you do not wish to become pregnant, use a form of birth control. If you plan to become pregnant, see your health care provider for a prepregnancy visit. Take aspirin only as told by your health care provider. Make sure that you understand how much to take and what form to take. Work with your health care provider to find out whether it is safe and beneficial for you to take aspirin daily. Find healthy ways to manage stress, such as: Meditation, yoga, or listening to music. Journaling. Talking to a trusted person. Spending time with friends and family. Minimize exposure to UV radiation to reduce your risk of skin cancer. Safety Always wear your seat belt while driving or riding in a vehicle. Do not drive: If you have been drinking alcohol. Do not ride with someone who has been drinking. When you are tired or distracted. While texting. If  you have been using any mind-altering substances or drugs. Wear a helmet and other protective equipment during sports activities. If you have firearms in your house, make sure you follow all gun safety procedures. Seek help if you have been physically or sexually abused. What's next? Visit your health care provider once a year for an annual wellness visit. Ask your health care provider how often you should have your eyes and teeth checked. Stay up to date on all vaccines. This information is not intended to replace advice given to you by your health care provider. Make sure you discuss any questions you have with your health care provider. Document Revised: 11/17/2020 Document Reviewed: 11/17/2020 Elsevier Patient Education  [place].

## 2022-03-28 NOTE — Assessment & Plan Note (Signed)
Still having issues with recurrent pain.  She has been following with cardiology has had a full cardiac work-up.  We will try prednisone burst and diclofenac to see if this will help with her pain.  She can discuss further with sports medicine.  May consider referral to PT.

## 2022-03-28 NOTE — Assessment & Plan Note (Signed)
She is on pravastatin 10 mg every other day.  We will check lipids today.  She is doing a great job with diet and exercise.

## 2022-03-28 NOTE — Progress Notes (Signed)
Chief Complaint:  Patty [name] is a 51 y.o. female who presents today for her annual comprehensive physical exam.    Assessment/Plan:  Chronic Problems Addressed Today: Costochondritis Still having issues with recurrent pain.  She has been following with cardiology has had a full cardiac work-up.  We will try prednisone burst and diclofenac to see if this will help with her pain.  She can discuss further with sports medicine.  May consider referral to PT.  Mixed hyperlipidemia She is on pravastatin 10 mg every other day.  We will check lipids today.  She is doing a great job with diet and exercise.  Preventative Healthcare: Check labs.  Up-to-date on vaccines and cancer screening.  Patient Counseling(The following topics were reviewed and/or handout was given):  -Nutrition: Stressed importance of moderation in sodium/caffeine intake, saturated fat and cholesterol, caloric balance, sufficient intake of fresh fruits, vegetables, and fiber.  -Stressed the importance of regular exercise.   -Substance Abuse: Discussed cessation/primary prevention of tobacco, alcohol, or other drug use; driving or other dangerous activities under the influence; availability of treatment for abuse.   -Injury prevention: Discussed safety belts, safety helmets, smoke detector, smoking near bedding or upholstery.   -Sexuality: Discussed sexually transmitted diseases, partner selection, use of condoms, avoidance of unintended pregnancy and contraceptive alternatives.   -Dental health: Discussed importance of regular tooth brushing, flossing, and dental visits.  -Health maintenance and immunizations reviewed. Please refer to Health maintenance section.  Return to care in 1 year for next preventative visit.     Subjective:  HPI:  She has no acute complaints today.   Lifestyle Diet: Balanced. Plenty of fruits and vegetables.  Exercise: Runs routinely.      03/28/2022    2:19 PM  Depression screen  PHQ 2/9  Decreased Interest 0  Down, Depressed, Hopeless 0  PHQ - 2 Score 0    Health Maintenance Due  Topic Date Due   Hepatitis C Screening  Never done     ROS: Per HPI, otherwise a complete review of systems was negative.   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   Sleep-disordered breathing 07/01/2021   Statin intolerance 05/23/2021   Costochondritis 01/26/2021   Polyarthralgia 01/26/2021   PAC (premature atrial contraction) 08/12/2020   Mixed hyperlipidemia 07/07/2020   Burning sensation of right foot 06/25/2020   Primary osteoarthritis of both knees 08/21/2017   Past Surgical History:  Procedure Laterality Date   COLONOSCOPY  15 years ago    in CT-normal   ENDOMETRIAL ABLATION  10/16/2013   SEPTOPLASTY     1998 and 2000   TURBINATE REDUCTION  08/2021    Family History  Problem Relation Age of Onset   Hypertension Father    Heart attack Maternal Grandmother    Heart attack Maternal Grandfather    High Cholesterol Other    Colon cancer Neg Hx    Colon polyps Neg Hx    Esophageal cancer Neg Hx    Rectal cancer Neg Hx    Stomach cancer Neg Hx    Sleep apnea Neg Hx     Medications- reviewed and updated Current Outpatient Medications  Medication Sig Dispense Refill   cetirizine-pseudoephedrine (ZYRTEC-D) 5-120  MG tablet Take 1 tablet by mouth as needed for allergies.     diclofenac (VOLTAREN) 75 MG EC tablet Take 1 tablet (75 mg total) by mouth 2 (two) times daily. 30 tablet 0   Hyaluronan (MONOVISC) 88 MG/4ML SOSY intra-articular injection Inject 1 syringe into each knee.  Dispense #2 syringes of MONOvisc.  Dx: Primary osteoarthritis of both knees. 8 mL 1   Multiple Vitamin (MULTIVITAMIN) tablet Take 1 tablet by  mouth daily.      pravastatin (PRAVACHOL) 20 MG tablet Take 1/2 tablet every other day to every day by mouth as tolerated. (Patient taking differently: Take 10 mg by mouth every other day. Take 1/2 tablet every other day to every day by mouth as tolerated.) 15 tablet 11   predniSONE (DELTASONE) 50 MG tablet Take 1 tablet daily for 5 days. Then take 1/2 tablet daily for 2 days. 6 tablet 0   No current facility-administered medications for this visit.    Allergies-reviewed and updated Allergies  Allergen Reactions   Rosuvastatin Calcium Other (See Comments)    Joint pain   Atorvastatin Other (See Comments)    Joint pain    Social History   Socioeconomic History   Marital status: Married    Spouse name: Not on file   Number of children: 2   Years of education: Not on file   Highest education level: Master's degree (e.g., MA, MS, MEng, MEd, MSW, MBA)  Occupational History   Occupation: Passenger transport manager  Tobacco Use   Smoking status: Never   Smokeless tobacco: Never  Vaping Use   Vaping Use: Never used  Substance and Sexual Activity   Alcohol use: Never    Comment: Social  beer/wine   Drug use: Never   Sexual activity: Yes    Partners: Male    Birth control/protection: None  Other Topics Concern   Not on file  Social History Narrative   Lives at home with husband and 2 children   Caffeine: 2 cups/day   Social Determinants of Health   Financial Resource Strain: Not on file  Food Insecurity: Not on file  Transportation Needs: Not on file  Physical Activity: Not on file  Stress: Not on file  Social Connections: Not on file        Objective:  Physical Exam: BP 105/64   Pulse 60   Temp (!) 97.5 F (36.4 C) (Temporal)   Ht 5' 7.5" (1.715 m)   Wt 119 lb 9.6 oz (54.3 kg)   SpO2 99%   BMI 18.46 kg/m   Body mass index is 18.46 kg/m. Wt Readings from Last 3 Encounters:  03/28/22 119 lb 9.6 oz (54.3 kg)  03/01/22 122 lb (55.3 kg)  09/12/21 121 lb (54.9 kg)    Gen: NAD, resting comfortably HEENT: TMs normal bilaterally. OP clear. No thyromegaly noted.  CV: RRR with no murmurs appreciated Pulm: NWOB, CTAB with no crackles, wheezes, or rhonchi GI: Normal bowel sounds present. Soft, Nontender, Nondistended. MSK: no edema, cyanosis, or clubbing noted Skin: warm, dry Neuro: CN2-12 grossly intact. Strength 5/5 in upper and lower extremities. Reflexes symmetric and intact bilaterally.  Psych: Normal affect and thought content     Jamaree Hosier M. Jerline Pain, MD 03/28/2022 2:48 PM

## 2022-03-29 LAB — COMPREHENSIVE METABOLIC PANEL
ALT: 19 U/L (ref 0–35)
AST: 25 U/L (ref 0–37)
Albumin: 4.6 g/dL (ref 3.5–5.2)
Alkaline Phosphatase: 53 U/L (ref 39–117)
BUN: 11 mg/dL (ref 6–23)
CO2: 30 mEq/L (ref 19–32)
Calcium: 9.5 mg/dL (ref 8.4–10.5)
Chloride: 99 mEq/L (ref 96–112)
Creatinine, Ser: 0.71 mg/dL (ref 0.40–1.20)
GFR: 97.63 mL/min (ref >60.00)
Glucose, Bld: 88 mg/dL (ref 70–99)
Potassium: 3.9 mEq/L (ref 3.5–5.1)
Sodium: 137 mEq/L (ref 135–145)
Total Bilirubin: 0.9 mg/dL (ref 0.2–1.2)
Total Protein: 7 g/dL (ref 6.0–8.3)

## 2022-03-29 LAB — CBC
HCT: 35.4 % — ABNORMAL LOW (ref 36.0–46.0)
Hemoglobin: 11.8 g/dL — ABNORMAL LOW (ref 12.0–15.0)
MCHC: 33.3 g/dL (ref 30.0–36.0)
MCV: 94.3 fl (ref 78.0–100.0)
Platelets: 263 10*3/uL (ref 150.0–400.0)
RBC: 3.75 Mil/uL — ABNORMAL LOW (ref 3.87–5.11)
RDW: 13.8 % (ref 11.5–15.5)
WBC: 5 10*3/uL (ref 4.0–10.5)

## 2022-03-29 LAB — TSH: TSH: 5.96 u[IU]/mL — ABNORMAL HIGH (ref 0.35–5.50)

## 2022-03-29 LAB — LIPID PANEL
Cholesterol: 190 mg/dL (ref 0–200)
HDL: 83 mg/dL (ref >39.00)
LDL Cholesterol: 93 mg/dL (ref 0–99)
NonHDL: 106.63
Total CHOL/HDL Ratio: 2
Triglycerides: 66 mg/dL (ref 0.0–149.0)
VLDL: 13.2 mg/dL (ref 0.0–40.0)

## 2022-03-29 LAB — HEPATITIS C ANTIBODY: Hepatitis C Ab: NONREACTIVE

## 2022-03-29 LAB — HIV ANTIBODY (ROUTINE TESTING W REFLEX): HIV 1&2 Ab, 4th Generation: NONREACTIVE

## 2022-03-29 LAB — HEMOGLOBIN A1C: Hgb A1c MFr Bld: 6.1 % (ref 4.6–6.5)

## 2022-03-31 ENCOUNTER — Encounter: Payer: BC Managed Care – PPO | Admitting: Family Medicine

## 2022-03-31 NOTE — Progress Notes (Signed)
Please inform patient of the following:  Her thyroid numbers are off a little bit.  Please have her come back to recheck TSH, free T4, and free T3.  Her blood counts dropped a little bit but this is still near her baseline-we can recheck this again in a year.  Her sugar went up a little bit as well.  We can recheck this again in a year.  Thing else is stable and we can recheck in a year.  She should continue the great work with diet and exercise.

## 2022-04-06 ENCOUNTER — Ambulatory Visit: Payer: BC Managed Care – PPO | Admitting: Sports Medicine

## 2022-04-06 ENCOUNTER — Ambulatory Visit (INDEPENDENT_AMBULATORY_CARE_PROVIDER_SITE_OTHER): Payer: BC Managed Care – PPO

## 2022-04-06 DIAGNOSIS — M170 Bilateral primary osteoarthritis of knee: Secondary | ICD-10-CM

## 2022-04-06 NOTE — Progress Notes (Signed)
    Procedures performed today:    Procedure: Real-time Ultrasound Guided injection of the left knee Device: Samsung HS60  Verbal informed consent obtained.  Time-out conducted.  Noted no overlying erythema, induration, or other signs of local infection.  Skin prepped in a sterile fashion.  Local anesthesia: Topical Ethyl chloride.  With sterile technique and under real time ultrasound guidance: Noted trace effusion, 88 mg/4 mL of MonoVisc (sodium hyaluronate) in a prefilled syringe was injected easily into the knee through a 22-gauge needle. Completed without difficulty  Advised to call if fevers/chills, erythema, induration, drainage, or persistent bleeding.  Images permanently stored and available for review in PACS.  Impression: Technically successful ultrasound guided injection.   Procedure: Real-time Ultrasound Guided injection of the right knee Device: Samsung HS60  Verbal informed consent obtained.  Time-out conducted.  Noted no overlying erythema, induration, or other signs of local infection.  Skin prepped in a sterile fashion.  Local anesthesia: Topical Ethyl chloride.  With sterile technique and under real time ultrasound guidance: Noted trace effusion, 88 mg/4 mL of MonoVisc (sodium hyaluronate) in a prefilled syringe was injected easily into the knee through a 22-gauge needle. Completed without difficulty  Advised to call if fevers/chills, erythema, induration, drainage, or persistent bleeding.  Images permanently stored and available for review in PACS.  Impression: Technically successful ultrasound guided injection.  Independent interpretation of notes and tests performed by another provider:   None.  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 Monovisc injection #1 of 1  both knees, return as needed.    ____________________________________________ Gwen Her. Dianah Field, M.D., ABFM., CAQSM., AME. Primary Care and Sports Medicine [facility]  Adjunct Professor of [place] [employer] [employer]  Risk manager

## 2022-04-06 NOTE — Assessment & Plan Note (Addendum)
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 Monovisc injection #1 of 1 both knees, return as needed.

## 2022-04-14 DIAGNOSIS — Z1231 Encounter for screening mammogram for malignant neoplasm of breast: Secondary | ICD-10-CM | POA: Diagnosis not present

## 2022-04-26 ENCOUNTER — Other Ambulatory Visit (INDEPENDENT_AMBULATORY_CARE_PROVIDER_SITE_OTHER): Payer: BC Managed Care – PPO

## 2022-04-26 ENCOUNTER — Other Ambulatory Visit: Payer: Self-pay

## 2022-04-26 DIAGNOSIS — Z0001 Encounter for general adult medical examination with abnormal findings: Secondary | ICD-10-CM | POA: Diagnosis not present

## 2022-04-26 LAB — T3, FREE: T3, Free: 3.2 pg/mL (ref 2.3–4.2)

## 2022-04-26 LAB — TSH: TSH: 3.08 u[IU]/mL (ref 0.35–5.50)

## 2022-04-26 LAB — T4, FREE: Free T4: 0.78 ng/dL (ref 0.60–1.60)

## 2022-04-28 ENCOUNTER — Other Ambulatory Visit: Payer: Self-pay | Admitting: Family Medicine

## 2022-04-29 ENCOUNTER — Other Ambulatory Visit: Payer: Self-pay | Admitting: Family Medicine

## 2022-04-30 MED ORDER — DICLOFENAC SODIUM 75 MG PO TBEC: 75.0000 mg | DELAYED_RELEASE_TABLET | Freq: Two times a day (BID) | ORAL | 0 refills | Status: DC

## 2022-05-02 NOTE — Progress Notes (Signed)
Please inform patient of the following:  Thyroid levels back to normal.  Do not need to make any other adjustments at this time.  We can recheck again in a year.

## 2022-05-24 ENCOUNTER — Encounter: Payer: Self-pay | Admitting: Physician Assistant

## 2022-05-24 ENCOUNTER — Ambulatory Visit: Payer: BC Managed Care – PPO | Admitting: Physician Assistant

## 2022-05-24 ENCOUNTER — Ambulatory Visit: Payer: BC Managed Care – PPO | Admitting: Family Medicine

## 2022-05-24 VITALS — BP 110/60 | HR 83 | Temp 98.0°F | Ht 67.5 in | Wt 123.0 lb

## 2022-05-24 DIAGNOSIS — J329 Chronic sinusitis, unspecified: Secondary | ICD-10-CM

## 2022-05-24 DIAGNOSIS — R509 Fever, unspecified: Secondary | ICD-10-CM

## 2022-05-24 LAB — POC URINALSYSI DIPSTICK (AUTOMATED)
Bilirubin, UA: NEGATIVE
Blood, UA: 1
Glucose, UA: NEGATIVE
Ketones, UA: NEGATIVE
Leukocytes, UA: NEGATIVE
Nitrite, UA: NEGATIVE
Protein, UA: NEGATIVE
Spec Grav, UA: >=1.03 — AB (ref 1.010–1.025)
Urobilinogen, UA: 0.2 E.U./dL
pH, UA: 5.5 (ref 5.0–8.0)

## 2022-05-24 MED ORDER — AMOXICILLIN-POT CLAVULANATE 875-125 MG PO TABS: 1.0000 | ORAL_TABLET | Freq: Two times a day (BID) | ORAL | 0 refills | Status: DC

## 2022-05-24 MED ORDER — DICLOFENAC SODIUM 75 MG PO TBEC: 75.0000 mg | DELAYED_RELEASE_TABLET | Freq: Two times a day (BID) | ORAL | 0 refills | Status: DC

## 2022-05-24 MED ORDER — FLUCONAZOLE 150 MG PO TABS: 150.0000 mg | ORAL_TABLET | Freq: Once | ORAL | 0 refills | Status: AC

## 2022-05-24 NOTE — Patient Instructions (Signed)
It was great to see you!  Finish zpack Start augmentin antbiotic  Start prednisone that you have at home Do not take ibuprofen while on prednisone -- may take tylenol for fever  Push fluids and rest  I have also send in diflucan for you to take if you develop yeast infection  I will be in touch with your urine results.  If any new/worsening symptoms, please let us know!  Take care,  Inda Coke PA-C

## 2022-05-24 NOTE — Progress Notes (Signed)
Patty [name] is a 51 y.o. female here for a new problem.  History of Present Illness:   Chief Complaint  Patient presents with   Sinus Problem    Pt is c/o cough, sinus pressure, fever off & on since Thursday. Pt went to Urgent Care and was given Z-pack    HPI  Sinus pressure She reports myalgias and a cough that started x6 days ago. She started having worsening symptoms along with sinus pressure and congestion the following day. She reports that her cough is most severe at night. Reports green phlegm with her cough and nasal discharge. Accompanying left rib discomfort which she attributes to her cough. Her sinus pressure is severe and her most bothersome symptom. Fever with a Tmax of 101F around 4:30AM today and chills. Notes having a poor appetite.  OTC cough medicines and lozenges without relief. Alternating Tylenol and Ibuprofen yesterday. Using warm compresses over her face with relief temporarily.  She went to urgent care x3 days ago and was started on a Zpak. Negative covid and flu tests at that time. Has 1 day of Zpak remaining without any relief. Hx of recurrent sinus infections. Denies any Hx of pulmonary problems.  Denies any eye discharge. Denies any urinary symptoms or Hx of recurrent UTIs.  Past Medical History:  Diagnosis Date   Anxiety    History of recurrent UTIs    Hyperlipidemia    Palpitations    Sinus trouble      Social History   Tobacco Use   Smoking status: Never   Smokeless tobacco: Never  Vaping Use   Vaping Use: Never used  Substance Use Topics   Alcohol use: Never    Comment: Social  beer/wine   Drug use: Never    Past Surgical History:  Procedure Laterality Date   COLONOSCOPY  15 years ago    in CT-normal   ENDOMETRIAL ABLATION  10/16/2013   SEPTOPLASTY     1998 and 2000   TURBINATE REDUCTION  08/2021    Family History  Problem Relation Age of Onset   Hypertension Father    Heart attack Maternal Grandmother     Heart attack Maternal Grandfather    High Cholesterol Other    Colon cancer Neg Hx    Colon polyps Neg Hx    Esophageal cancer Neg Hx    Rectal cancer Neg Hx    Stomach cancer Neg Hx    Sleep apnea Neg Hx     Allergies  Allergen Reactions   Rosuvastatin Calcium Other (See Comments)    Joint pain   Atorvastatin Other (See Comments)    Joint pain    Current Medications:   Current Outpatient Medications:    azithromycin (ZITHROMAX) 250 MG tablet, Take 250 mg by mouth as directed., Disp: , Rfl:    benzonatate (TESSALON) 200 MG capsule, Take 400 mg by mouth every 8 (eight) hours as needed., Disp: , Rfl:    diclofenac (VOLTAREN) 75 MG EC tablet, Take 1 tablet (75 mg total) by mouth 2 (two) times daily., Disp: 30 tablet, Rfl: 0   Hyaluronan (MONOVISC) 88 MG/4ML SOSY intra-articular injection, Inject 1 syringe into each knee.  Dispense #2 syringes of MONOvisc.  Dx: Primary osteoarthritis of both knees., Disp: 8 mL, Rfl: 1   Multiple Vitamin (MULTIVITAMIN) tablet, Take 1 tablet by mouth daily. , Disp: , Rfl:    pravastatin (PRAVACHOL) 20 MG tablet, Take 1/2 tablet every other day to every day by mouth as tolerated. (  Patient taking differently: Take 10 mg by mouth every other day. Take 1/2 tablet every other day to every day by mouth as tolerated.), Disp: 15 tablet, Rfl: 11   Review of Systems:   Review of Systems  Constitutional:  Positive for chills and fever.  HENT:  Positive for congestion and sinus pain.   Eyes:  Positive for discharge.  Respiratory:  Positive for cough and sputum production.   Genitourinary:  Negative for dysuria, flank pain, frequency, hematuria and urgency.  Musculoskeletal:  Positive for joint pain and myalgias.    Vitals:   Vitals:   05/24/22 0920  BP: 110/60  Pulse: 83  Temp: 98 F (36.7 C)  TempSrc: Temporal  SpO2: 96%  Weight: 123 lb (55.8 kg)  Height: 5' 7.5" (1.715 m)     Body mass index is 18.98 kg/m.  Physical Exam:   Physical  Exam Vitals and nursing note reviewed.  Constitutional:      General: She is not in acute distress.    Appearance: She is well-developed. She is not ill-appearing or toxic-appearing.  HENT:     Head: Normocephalic and atraumatic.     Right Ear: Tympanic membrane, ear canal and external ear normal. Tympanic membrane is not erythematous, retracted or bulging.     Left Ear: Tympanic membrane, ear canal and external ear normal. Tympanic membrane is not erythematous, retracted or bulging.     Nose:     Right Sinus: Maxillary sinus tenderness and frontal sinus tenderness present.     Left Sinus: Maxillary sinus tenderness and frontal sinus tenderness present.     Mouth/Throat:     Pharynx: Uvula midline. No posterior oropharyngeal erythema.  Eyes:     General: Lids are normal.     Conjunctiva/sclera: Conjunctivae normal.  Neck:     Trachea: Trachea normal.  Cardiovascular:     Rate and Rhythm: Normal rate and regular rhythm.     Pulses: Normal pulses.     Heart sounds: Normal heart sounds, S1 normal and S2 normal.  Pulmonary:     Effort: Pulmonary effort is normal.     Breath sounds: Normal breath sounds. No decreased breath sounds, wheezing, rhonchi or rales.  Lymphadenopathy:     Cervical: No cervical adenopathy.  Skin:    General: Skin is warm and dry.  Neurological:     Mental Status: She is alert.     GCS: GCS eye subscore is 4. GCS verbal subscore is 5. GCS motor subscore is 6.  Psychiatric:        Speech: Speech normal.        Behavior: Behavior normal. Behavior is cooperative.    Results for orders placed or performed in visit on 05/24/22  POCT Urinalysis Dipstick (Automated)  Result Value Ref Range   Color, UA yellow    Clarity, UA clear    Glucose, UA Negative Negative   Bilirubin, UA Negative    Ketones, UA Negative    Spec Grav, UA >=1.030 (A) 1.010 - 1.025   Blood, UA 1    pH, UA 5.5 5.0 - 8.0   Protein, UA Negative Negative   Urobilinogen, UA 0.2 0.2 or 1.0  E.U./dL   Nitrite, UA Negative    Leukocytes, UA Negative Negative    Assessment and Plan:   Fever, unspecified fever cause No red flags Suspect related to sinusitis UA unremarkable but we will send off for culture Offered repeat flu/covid testing however this will not change management Will add augmentin  for better sinusitis coverage  Sinusitis, unspecified chronicity, unspecified location No red flags Complete azithromycin Start augmentin She has prednisone at home and is going to take for a few days to help with nasal pressure Tylenol prn for fever Follow-up if new/worsening or lack of improvement  I,Alexis Herring,acting as a scribe for [employer], PA.,have documented all relevant documentation on the behalf of Inda Coke, PA,as directed by  Inda Coke, PA while in the presence of Inda Coke, [place].  I, Inda Coke, [place], have reviewed all documentation for this visit. The documentation on 05/24/22 for the exam, diagnosis, procedures, and orders are all accurate and complete.  Inda Coke, PA-C

## 2022-05-25 LAB — URINE CULTURE
MICRO NUMBER:: 14340220
Result:: NO GROWTH
SPECIMEN QUALITY:: ADEQUATE

## 2022-06-01 ENCOUNTER — Encounter: Payer: Self-pay | Admitting: Family

## 2022-06-01 ENCOUNTER — Ambulatory Visit: Payer: BC Managed Care – PPO | Admitting: Family

## 2022-06-01 ENCOUNTER — Ambulatory Visit (INDEPENDENT_AMBULATORY_CARE_PROVIDER_SITE_OTHER)
Admission: RE | Admit: 2022-06-01 | Discharge: 2022-06-01 | Disposition: A | Discharge status: Home / Self Care | Payer: BC Managed Care – PPO | Source: Ambulatory Visit | Attending: Family | Admitting: Family

## 2022-06-01 VITALS — BP 96/65 | HR 58 | Temp 97.8°F | Ht 67.5 in | Wt 122.2 lb

## 2022-06-01 DIAGNOSIS — R053 Chronic cough: Secondary | ICD-10-CM

## 2022-06-01 DIAGNOSIS — R059 Cough, unspecified: Secondary | ICD-10-CM | POA: Diagnosis not present

## 2022-06-01 MED ORDER — PREDNISONE 20 MG PO TABS: ORAL_TABLET | ORAL | 0 refills | Status: DC

## 2022-06-01 NOTE — Progress Notes (Signed)
Patient ID: Patty [name], female    DOB: [birth year], 51 y.o.   MRN: [id number]  Chief Complaint  Patient presents with   Follow-up    Sinus problem, Has finished Prednisone and Augmentin. Pt c/o still having body aches and a slight cough for about 2 weeks.    HPI:      Cough:  pt reports taking prednisone & Augmentin for sinusitis and finished both about 2 days ago. States she started with a cough a about 10 days ago, with clear phlegm, that has caused her ribs to ache as well as her whole body. Denies fever, states she can still feel some sinus pressure, but overall sinuses are better.      Assessment & Plan:  1. Persistent cough - lungs clear. advised pt to take up to '800mg'$  of Ibuprofen tid or 2 generic Aleve bid to help with rib/costal pain, sending another round of prednisone. Advised on increased water intake, cough lozenges. Pt requesting CXR to reassure no PNA or cracked rib.   - DG Chest 2 View; Future - predniSONE (DELTASONE) 20 MG tablet; Take 2 pills in the morning with breakfast for 3 days, then 1 pill for 2 days  Dispense: 8 tablet; Refill: 0   Subjective:    Outpatient Medications Prior to Visit  Medication Sig Dispense Refill   diclofenac (VOLTAREN) 75 MG EC tablet Take 1 tablet (75 mg total) by mouth 2 (two) times daily. 30 tablet 0   Hyaluronan (MONOVISC) 88 MG/4ML SOSY intra-articular injection Inject 1 syringe into each knee.  Dispense #2 syringes of MONOvisc.  Dx: Primary osteoarthritis of both knees. 8 mL 1   Multiple Vitamin (MULTIVITAMIN) tablet Take 1 tablet by mouth daily.      pravastatin (PRAVACHOL) 20 MG tablet Take 1/2 tablet every other day to every day by mouth as tolerated. (Patient taking differently: Take 10 mg by mouth every other day. Take 1/2 tablet every other day to every day by mouth as tolerated.) 15 tablet 11   benzonatate (TESSALON) 200 MG capsule Take 400 mg by mouth every 8 (eight) hours as needed. (Patient not taking: Reported on  06/01/2022)     amoxicillin-clavulanate (AUGMENTIN) 875-125 MG tablet Take 1 tablet by mouth 2 (two) times daily. 14 tablet 0   azithromycin (ZITHROMAX) 250 MG tablet Take 250 mg by mouth as directed.     No facility-administered medications prior to visit.   Past Medical History:  Diagnosis Date   Anxiety    History of recurrent UTIs    Hyperlipidemia    Palpitations    Sinus trouble    Past Surgical History:  Procedure Laterality Date   COLONOSCOPY  15 years ago    in CT-normal   ENDOMETRIAL ABLATION  10/16/2013   SEPTOPLASTY     1998 and 2000   TURBINATE REDUCTION  08/2021   Allergies  Allergen Reactions   Rosuvastatin Calcium Other (See Comments)    Joint pain   Atorvastatin Other (See Comments)    Joint pain      Objective:    Physical Exam Vitals and nursing note reviewed.  Constitutional:      Appearance: Normal appearance.  Cardiovascular:     Rate and Rhythm: Normal rate and regular rhythm.  Pulmonary:     Effort: Pulmonary effort is normal.     Breath sounds: Normal breath sounds.  Musculoskeletal:        General: Normal range of motion.  Skin:    General:  Skin is warm and dry.  Neurological:     Mental Status: She is alert.  Psychiatric:        Mood and Affect: Mood normal.        Behavior: Behavior normal.   BP 96/65 (BP Location: Left Arm, Patient Position: Sitting, Cuff Size: Normal)   Pulse (!) 58   Temp 97.8 F (36.6 C) (Temporal)   Ht 5' 7.5" (1.715 m)   Wt 122 lb 3.2 oz (55.4 kg)   SpO2 98%   BMI 18.86 kg/m  Wt Readings from Last 3 Encounters:  06/01/22 122 lb 3.2 oz (55.4 kg)  05/24/22 123 lb (55.8 kg)  03/28/22 119 lb 9.6 oz (54.3 kg)       Jeanie Sewer, NP

## 2022-06-06 NOTE — Progress Notes (Signed)
Your chest xray is normal, I hope you are feeling better.

## 2022-06-07 ENCOUNTER — Other Ambulatory Visit: Payer: Self-pay | Admitting: Family

## 2022-06-07 MED ORDER — FLUCONAZOLE 150 MG PO TABS: ORAL_TABLET | ORAL | 0 refills | Status: DC

## 2022-06-07 NOTE — Progress Notes (Signed)
med sent to [facility] in [place]

## 2022-06-12 ENCOUNTER — Encounter: Payer: Self-pay | Admitting: Internal Medicine

## 2022-06-12 ENCOUNTER — Ambulatory Visit: Payer: BC Managed Care – PPO | Attending: Internal Medicine | Admitting: Internal Medicine

## 2022-06-12 VITALS — BP 100/60 | HR 62 | Ht 67.5 in | Wt 123.0 lb

## 2022-06-12 DIAGNOSIS — M940 Chondrocostal junction syndrome [Tietze]: Secondary | ICD-10-CM

## 2022-06-12 DIAGNOSIS — E7841 Elevated Lipoprotein(a): Secondary | ICD-10-CM

## 2022-06-12 DIAGNOSIS — R079 Chest pain, unspecified: Secondary | ICD-10-CM

## 2022-06-12 DIAGNOSIS — Z789 Other specified health status: Secondary | ICD-10-CM | POA: Diagnosis not present

## 2022-06-12 DIAGNOSIS — E782 Mixed hyperlipidemia: Secondary | ICD-10-CM

## 2022-06-12 DIAGNOSIS — I491 Atrial premature depolarization: Secondary | ICD-10-CM

## 2022-06-12 NOTE — Patient Instructions (Signed)
Medication Instructions:  Your physician recommends that you continue on your current medications as directed. Please refer to the Current Medication list given to you today.  *If you need a refill on your cardiac medications before your next appointment, please call your pharmacy*   Lab Work: NONE If you have labs (blood work) drawn today and your tests are completely normal, you will receive your results only by: [place] (if you have MyChart) OR A paper copy in the mail If you have any lab test that is abnormal or we need to change your treatment, we will call you to review the results.   Testing/Procedures: Your physician has requested that you have a stress echocardiogram. For further information please visit [email or web address]. Please follow instruction sheet as given.    Follow-Up: At [facility], you and your health needs are our priority.  As part of our continuing mission to provide you with exceptional heart care, we have created designated Provider Care Teams.  These Care Teams include your primary Cardiologist (physician) and Advanced Practice Providers (APPs -  Physician Assistants and Nurse Practitioners) who all work together to provide you with the care you need, when you need it.  We recommend signing up for the patient portal called "MyChart".  Sign up information is provided on this After Visit Summary.  MyChart is used to connect with patients for Virtual Visits (Telemedicine).  Patients are able to view lab/test results, encounter notes, upcoming appointments, etc.  Non-urgent messages can be sent to your provider as well.   To learn more about what you can do with MyChart, go to [email or web address].    Your next appointment:   6 month(s)  The format for your next appointment:   In Person  Provider:   Werner Lean, MD      Important Information About Sugar

## 2022-06-12 NOTE — Progress Notes (Signed)
Cardiology Office Note:    Date:  06/12/2022   ID:  Patty [name], DOB May 16, 1971, MRN [id number]  PCP:  Vivi Barrack, MD  [facility] Cardiologist:  Rudean Haskell MD [place] Electrophysiologist:  None   CC: HLD  History of Present Illness:    Patty [name] is a 52 y.o. female with a hx of Palpitations, HLD who presents for evaluation 07/07/20.   2022: In interim of this visit, patient had normal Echo, Zio, and CAC. In interim of this visit, patient ran the [facility].  2023: Has called in with myalgia concerns and follow up for LDL mgmt.Tolerated low dose pravastatin every other day. Had normal sleep study.  Patient notes that she is doing well.   Daughter is doing well  Since last visit notes both slow and fast heart rates. There are no interval hospital/ED visit.    Notes chest pain off diclophenac.  No SOB/DOE and no PND/Orthopnea.  No weight gain or leg swelling.  Taking pravastatin every day now. She will do the [employer].  Past Medical History:  Diagnosis Date   Anxiety    History of recurrent UTIs    Hyperlipidemia    Palpitations    Sinus trouble     Past Surgical History:  Procedure Laterality Date   COLONOSCOPY  15 years ago    in CT-normal   ENDOMETRIAL ABLATION  10/16/2013   SEPTOPLASTY     1998 and 2000   TURBINATE REDUCTION  08/2021    Current Medications: Current Meds  Medication Sig   diclofenac (VOLTAREN) 75 MG EC tablet Take 1 tablet (75 mg total) by mouth 2 (two) times daily.   Hyaluronan (MONOVISC) 88 MG/4ML SOSY intra-articular injection Inject 1 syringe into each knee.  Dispense #2 syringes of MONOvisc.  Dx: Primary osteoarthritis of both knees. (Patient taking differently: Inject 1 syringe into each knee annually -  Dispense #2 syringes of MONOvisc.  Dx: Primary osteoarthritis of both knees.)   Multiple Vitamin (MULTIVITAMIN) tablet Take 1 tablet by mouth daily.    pravastatin (PRAVACHOL) 20 MG tablet  Take 1/2 tablet every other day to every day by mouth as tolerated. (Patient taking differently: Take 10 mg by mouth every other day. Take 1/2 tablet every other day to every day by mouth as tolerated.)   predniSONE (DELTASONE) 20 MG tablet Take 2 pills in the morning with breakfast for 3 days, then 1 pill for 2 days     Allergies:   Rosuvastatin calcium and Atorvastatin   Social History   Socioeconomic History   Marital status: Married    Spouse name: Not on file   Number of children: 2   Years of education: Not on file   Highest education level: Master's degree (e.g., MA, MS, MEng, MEd, MSW, MBA)  Occupational History   Occupation: Passenger transport manager  Tobacco Use   Smoking status: Never   Smokeless tobacco: Never  Vaping Use   Vaping Use: Never used  Substance and Sexual Activity   Alcohol use: Never    Comment: Social  beer/wine   Drug use: Never   Sexual activity: Yes    Partners: Male    Birth control/protection: None  Other Topics Concern   Not on file  Social History Narrative   Lives at home with husband and 2 children   Caffeine: 2 cups/day   Social Determinants of Health   Financial Resource Strain: Not on file  Food Insecurity: Not on file  Transportation Needs: Not on file  Physical Activity: Not on file  Stress: Not on file  Social Connections: Not on file    Social:  Ihor Austin ran  to the [employer] in 2022, moved oldest to college recently  Family History: The patient's family history includes Heart attack in her maternal grandfather and maternal grandmother; High Cholesterol in an other family member; Hypertension in her father. There is no history of Colon cancer, Colon polyps, Esophageal cancer, Rectal cancer, Stomach cancer, or Sleep apnea. History of coronary artery disease notable for maternal and paternal grandfather and grandmother. History of heart failure notable for no members. No history of cardiomyopathies including hypertrophic  cardiomyopathy, left ventricular non-compaction, or arrhythmogenic right ventricular cardiomyopathy. History of arrhythmia notable for no members. Denies family history of sudden cardiac death including drowning, car accidents, or unexplained deaths in the family. No history of bicuspid aortic valve or aortic aneurysm or dissection.   ROS:   Please see the history of present illness.     All other systems reviewed and are negative.  EKGs/Labs/Other Studies Reviewed:    The following studies were reviewed today:  EKG:   03/01/22: Sinus bradycardia rate 50 09/01/21: SR rate 67 05/20/21: SR rate 68 WNL 07/05/20:  Sinus Bradycardia rate 53; WNL  Cardiac Studies & Procedures       ECHOCARDIOGRAM  ECHOCARDIOGRAM COMPLETE 07/16/2020  Narrative ECHOCARDIOGRAM REPORT    Patient Name:   Patty [name] Date of Exam: 07/16/2020 Medical Rec #:  [id number]           Height:       67.0 in Accession #:    [id number]          Weight:       123.0 lb Date of Birth:  09/17/1970           BSA:          1.645 m Patient Age:    67 years            BP:           110/56 mmHg Patient Gender: F                   HR:           56 bpm. Exam Location:  Outpatient  Procedure: 2D Echo, Cardiac Doppler and Color Doppler  Indications:    Murmur 785.2 / R01.1  History:        Patient has no prior history of Echocardiogram examinations. Risk Factors:Non-Smoker and Dyslipidemia. Palpitations.  Sonographer:    Vickie Epley RDCS Referring Phys: [id number] [facility] A Rickie Gange  IMPRESSIONS   1. Left ventricular ejection fraction, by estimation, is 55 to 60%. The left ventricle has normal function. The left ventricle has no regional wall motion abnormalities. Left ventricular diastolic parameters were normal. 2. Right ventricular systolic function is normal. The right ventricular size is normal. There is normal pulmonary artery systolic pressure. 3. The mitral valve is normal in structure. Trivial mitral  valve regurgitation. No evidence of mitral stenosis. 4. The aortic valve is normal in structure. Aortic valve regurgitation is not visualized. No aortic stenosis is present. 5. The inferior vena cava is normal in size with greater than 50% respiratory variability, suggesting right atrial pressure of 3 mmHg.  FINDINGS Left Ventricle: Left ventricular ejection fraction, by estimation, is 55 to 60%. The left ventricle has normal function. The left ventricle has no regional wall motion abnormalities. The left ventricular  internal cavity size was normal in size. There is no left ventricular hypertrophy. Left ventricular diastolic parameters were normal.  Right Ventricle: The right ventricular size is normal. No increase in right ventricular wall thickness. Right ventricular systolic function is normal. There is normal pulmonary artery systolic pressure. The tricuspid regurgitant velocity is 2.06 m/s, and with an assumed right atrial pressure of 3 mmHg, the estimated right ventricular systolic pressure is 34.1 mmHg.  Left Atrium: Left atrial size was normal in size.  Right Atrium: Right atrial size was normal in size.  Pericardium: There is no evidence of pericardial effusion.  Mitral Valve: The mitral valve is normal in structure. Trivial mitral valve regurgitation. No evidence of mitral valve stenosis.  Tricuspid Valve: The tricuspid valve is normal in structure. Tricuspid valve regurgitation is trivial. No evidence of tricuspid stenosis.  Aortic Valve: The aortic valve is normal in structure. Aortic valve regurgitation is not visualized. No aortic stenosis is present.  Pulmonic Valve: The pulmonic valve was normal in structure. Pulmonic valve regurgitation is not visualized. No evidence of pulmonic stenosis.  Aorta: The aortic root is normal in size and structure.  Venous: The inferior vena cava is normal in size with greater than 50% respiratory variability, suggesting right atrial pressure  of 3 mmHg.  IAS/Shunts: No atrial level shunt detected by color flow Doppler.   LEFT VENTRICLE PLAX 2D LVIDd:         4.60 cm      Diastology LVIDs:         3.40 cm      LV e' medial:    8.70 cm/s LV PW:         0.60 cm      LV E/e' medial:  7.7 LV IVS:        0.60 cm      LV e' lateral:   10.40 cm/s LVOT diam:     2.10 cm      LV E/e' lateral: 6.5 LV SV:         72 LV SV Index:   44 LVOT Area:     3.46 cm  LV Volumes (MOD) LV vol d, MOD A2C: 110.0 ml LV vol d, MOD A4C: 112.0 ml LV vol s, MOD A2C: 45.3 ml LV vol s, MOD A4C: 51.6 ml LV SV MOD A2C:     64.7 ml LV SV MOD A4C:     112.0 ml LV SV MOD BP:      64.2 ml  RIGHT VENTRICLE RV S prime:     13.30 cm/s TAPSE (M-mode): 2.7 cm  LEFT ATRIUM             Index       RIGHT ATRIUM           Index LA diam:        2.70 cm 1.64 cm/m  RA Area:     10.60 cm LA Vol (A2C):   33.8 ml 20.55 ml/m RA Volume:   21.60 ml  13.13 ml/m LA Vol (A4C):   19.6 ml 11.92 ml/m LA Biplane Vol: 27.8 ml 16.90 ml/m AORTIC VALVE LVOT Vmax:   91.40 cm/s LVOT Vmean:  62.300 cm/s LVOT VTI:    0.207 m  AORTA Ao Root diam: 2.70 cm  MITRAL VALVE               TRICUSPID VALVE MV Area (PHT): 3.60 cm    TR Peak grad:   17.0 mmHg MV Decel Time: 211  msec    TR Vmax:        206.00 cm/s MV E velocity: 67.30 cm/s MV A velocity: 59.60 cm/s  SHUNTS MV E/A ratio:  1.13        Systemic VTI:  0.21 m Systemic Diam: 2.10 cm  Mihai Croitoru MD Electronically signed by Sanda Klein MD Signature Date/Time: 07/16/2020/10:46:23 AM    Final    MONITORS  LONG TERM MONITOR (3-14 DAYS) 08/05/2020  Narrative  Patient had a minimum heart rate of 44 bpm, maximum heart rate of 182 bpm, and average heart rate of 70 bpm.  Predominant underlying rhythm was sinus rhythm.  Two runs of supraventricular tachycardia occurred lasting 6 beats at longest with a max rate of 136 bpm at fastest.  Isolated PACs were rare (<1.0%), with rare couplets and triplets  present.  Isolated PVCs were rare (<1.0%), with rare couplets present.  No evidence of complete heart block.  Triggered and diary events associated with sinus rhythm (algorithrm also reads for SVE but there is no post extra-systolic pause).  No malignant arrhythmias.   CT SCANS  CT CARDIAC SCORING (SELF PAY ONLY) 07/21/2020  Addendum 07/21/2020  5:39 PM ADDENDUM REPORT: 07/21/2020 17:36  CLINICAL DATA:  Risk stratification: 52 Year-old Hispanic Female  EXAM: Coronary Calcium Score  TECHNIQUE: The patient was scanned on a [employer]. Axial non-contrast 3 mm slices were carried out through the heart. The data set was analyzed on a dedicated work station and scored using the [place].  FINDINGS: Non-cardiac: See separate report from [facility].  Ascending Aorta: Normal caliber.  Pericardium: Normal.  Coronary arteries: Normal origins.  Coronary calcium score of 0. This was 1st percentile for age, gender, and race matched controls.  IMPRESSION: 1. Coronary calcium score of 0. This was 1st percentile for age, gender, and race matched controls.  Rudean Haskell, MD   Electronically Signed By: Rudean Haskell MD On: 07/21/2020 17:36  Narrative EXAM: OVER-READ INTERPRETATION  CT CHEST  The following report is an over-read performed by radiologist Dr. Vinnie Langton of [facility], [place] on 07/21/2020. This over-read does not include interpretation of cardiac or coronary anatomy or pathology. The coronary calcium score interpretation by the cardiologist is attached.  COMPARISON:  None.  FINDINGS: Within the visualized portions of the thorax there are no suspicious appearing pulmonary nodules or masses, there is no acute consolidative airspace disease, no pleural effusions, no pneumothorax and no lymphadenopathy. Visualized portions of the upper abdomen are unremarkable. There are no aggressive appearing lytic or  blastic lesions noted in the visualized portions of the skeleton.  IMPRESSION: No significant incidental noncardiac findings are noted.  Electronically Signed: By: Vinnie Langton M.D. On: 07/21/2020 13:29           Recent Labs: 03/28/2022: ALT 19; BUN 11; Creatinine, Ser 0.71; Hemoglobin 11.8; Platelets 263.0; Potassium 3.9; Sodium 137 04/26/2022: TSH 3.08  Recent Lipid Panel    Component Value Date/Time   CHOL 190 03/28/2022 1445   CHOL 186 01/20/2022 0856   TRIG 66.0 03/28/2022 1445   HDL 83.00 03/28/2022 1445   HDL 79 01/20/2022 0856   CHOLHDL 2 03/28/2022 1445   VLDL 13.2 03/28/2022 1445   LDLCALC 93 03/28/2022 1445   LDLCALC 94 01/20/2022 0856   LDLCALC 113 (H) 02/10/2020 1011    Physical Exam:    VS:  BP 100/60   Pulse 62   Ht 5' 7.5" (1.715 m)   Wt 123 lb (55.8 kg)  SpO2 99%   BMI 18.98 kg/m     Wt Readings from Last 3 Encounters:  06/12/22 123 lb (55.8 kg)  06/01/22 122 lb 3.2 oz (55.4 kg)  05/24/22 123 lb (55.8 kg)    Gen: NAD Neck: No JVD Cardiac: No Rubs or Gallops, no murmur, normal rhythm +2 radial pulses Respiratory: Clear to auscultation bilaterally, normal effort, normal  respiratory rate  GI: Soft, nontender, non-distended  MS: No edema;  moves all extremities Integument: Skin feels warm Neuro:  At time of evaluation, alert and oriented to person/place/time/situation  Psych: Anxious affect  ASSESSMENT:    1. Chest pain of uncertain etiology   2. Costochondritis   3. Statin intolerance   4. Mixed hyperlipidemia     PLAN:    Chest pain - zero calcium score - Given her persistent nature, will get stress echo; I ultimately think this is costochondritis but her concern is death while running this marathon - we have discussed CBT for Non Cardiac Chest pain and have provider some resources  Hyperlipidemia (mixed) Statin intolerance presently on pravastatin 10 mg; now able to tolerate daily - LDL < 100, continue; will recheck at  next visit  PACs - no plans for PRN AV nodal agents  Six months with me  Time Spent Directly with Patient:   I have spent a total of 40 minutes with the patient reviewing notes, imaging, EKGs, labs and examining the patient as well as establishing an assessment and plan that was discussed personally with the patient.  > 50% of time was spent in direct patient care.      Medication Adjustments/Labs and Tests Ordered: Current medicines are reviewed at length with the patient today.  Concerns regarding medicines are outlined above.  Orders Placed This Encounter  Procedures   Cardiac Stress Test: Informed Consent Details: Physician/Practitioner Attestation; Transcribe to consent form and obtain patient signature   ECHOCARDIOGRAM STRESS TEST    No orders of the defined types were placed in this encounter.    Patient Instructions  Medication Instructions:  Your physician recommends that you continue on your current medications as directed. Please refer to the Current Medication list given to you today.  *If you need a refill on your cardiac medications before your next appointment, please call your pharmacy*   Lab Work: NONE If you have labs (blood work) drawn today and your tests are completely normal, you will receive your results only by: [place] (if you have MyChart) OR A paper copy in the mail If you have any lab test that is abnormal or we need to change your treatment, we will call you to review the results.   Testing/Procedures: Your physician has requested that you have a stress echocardiogram. For further information please visit [email or web address]. Please follow instruction sheet as given.    Follow-Up: At [facility], you and your health needs are our priority.  As part of our continuing mission to provide you with exceptional heart care, we have created designated Provider Care Teams.  These Care Teams include your primary Cardiologist  (physician) and Advanced Practice Providers (APPs -  Physician Assistants and Nurse Practitioners) who all work together to provide you with the care you need, when you need it.  We recommend signing up for the patient portal called "MyChart".  Sign up information is provided on this After Visit Summary.  MyChart is used to connect with patients for Virtual Visits (Telemedicine).  Patients are able to view lab/test results, encounter  notes, upcoming appointments, etc.  Non-urgent messages can be sent to your provider as well.   To learn more about what you can do with MyChart, go to [email or web address].    Your next appointment:   6 month(s)  The format for your next appointment:   In Person  Provider:   Werner Lean, MD      Important Information About Sugar         Signed, Werner Lean, MD  06/12/2022 10:24 AM    [place]

## 2022-06-19 DIAGNOSIS — M25672 Stiffness of left ankle, not elsewhere classified: Secondary | ICD-10-CM | POA: Diagnosis not present

## 2022-06-19 DIAGNOSIS — M7918 Myalgia, other site: Secondary | ICD-10-CM | POA: Diagnosis not present

## 2022-06-19 DIAGNOSIS — M76812 Anterior tibial syndrome, left leg: Secondary | ICD-10-CM | POA: Diagnosis not present

## 2022-06-19 DIAGNOSIS — M76811 Anterior tibial syndrome, right leg: Secondary | ICD-10-CM | POA: Diagnosis not present

## 2022-06-20 ENCOUNTER — Encounter: Payer: Self-pay | Admitting: Internal Medicine

## 2022-06-21 ENCOUNTER — Telehealth (HOSPITAL_COMMUNITY): Payer: Self-pay | Admitting: *Deleted

## 2022-06-21 DIAGNOSIS — M7918 Myalgia, other site: Secondary | ICD-10-CM | POA: Diagnosis not present

## 2022-06-21 DIAGNOSIS — M76812 Anterior tibial syndrome, left leg: Secondary | ICD-10-CM | POA: Diagnosis not present

## 2022-06-21 DIAGNOSIS — M25672 Stiffness of left ankle, not elsewhere classified: Secondary | ICD-10-CM | POA: Diagnosis not present

## 2022-06-21 DIAGNOSIS — M76811 Anterior tibial syndrome, right leg: Secondary | ICD-10-CM | POA: Diagnosis not present

## 2022-06-21 NOTE — Telephone Encounter (Signed)
Left message on voicemail per DPR in reference to upcoming appointment scheduled on 06/28/22 with detailed instructions given per Myocardial Perfusion Study Information Sheet for the test. LM to arrive 15 minutes early, and that it is imperative to arrive on time for appointment to keep from having the test rescheduled. If you need to cancel or reschedule your appointment, please call the office within 24 hours of your appointment. Failure to do so may result in a cancellation of your appointment, and a $50 no show fee. Phone number given for call back for any questions. Kirstie Peri

## 2022-06-26 DIAGNOSIS — M76811 Anterior tibial syndrome, right leg: Secondary | ICD-10-CM | POA: Diagnosis not present

## 2022-06-26 DIAGNOSIS — M7918 Myalgia, other site: Secondary | ICD-10-CM | POA: Diagnosis not present

## 2022-06-26 DIAGNOSIS — M25672 Stiffness of left ankle, not elsewhere classified: Secondary | ICD-10-CM | POA: Diagnosis not present

## 2022-06-26 DIAGNOSIS — M76812 Anterior tibial syndrome, left leg: Secondary | ICD-10-CM | POA: Diagnosis not present

## 2022-06-28 ENCOUNTER — Ambulatory Visit (HOSPITAL_COMMUNITY): Payer: BC Managed Care – PPO | Attending: Cardiology

## 2022-06-28 DIAGNOSIS — M25672 Stiffness of left ankle, not elsewhere classified: Secondary | ICD-10-CM | POA: Diagnosis not present

## 2022-06-28 DIAGNOSIS — R079 Chest pain, unspecified: Secondary | ICD-10-CM | POA: Insufficient documentation

## 2022-06-28 DIAGNOSIS — M7918 Myalgia, other site: Secondary | ICD-10-CM | POA: Diagnosis not present

## 2022-06-28 DIAGNOSIS — M76811 Anterior tibial syndrome, right leg: Secondary | ICD-10-CM | POA: Diagnosis not present

## 2022-06-28 DIAGNOSIS — M76812 Anterior tibial syndrome, left leg: Secondary | ICD-10-CM | POA: Diagnosis not present

## 2022-06-28 LAB — ECHOCARDIOGRAM STRESS TEST
Area-P 1/2: 3.26 cm2
S' Lateral: 3 cm

## 2022-06-28 MED ORDER — PERFLUTREN LIPID MICROSPHERE
1.0000 mL | INTRAVENOUS | Status: AC | PRN
  Administered 2022-06-28: 3 mL via INTRAVENOUS

## 2022-07-03 DIAGNOSIS — M76811 Anterior tibial syndrome, right leg: Secondary | ICD-10-CM | POA: Diagnosis not present

## 2022-07-03 DIAGNOSIS — M25672 Stiffness of left ankle, not elsewhere classified: Secondary | ICD-10-CM | POA: Diagnosis not present

## 2022-07-03 DIAGNOSIS — M7918 Myalgia, other site: Secondary | ICD-10-CM | POA: Diagnosis not present

## 2022-07-03 DIAGNOSIS — M76812 Anterior tibial syndrome, left leg: Secondary | ICD-10-CM | POA: Diagnosis not present

## 2022-07-05 DIAGNOSIS — M76812 Anterior tibial syndrome, left leg: Secondary | ICD-10-CM | POA: Diagnosis not present

## 2022-07-05 DIAGNOSIS — M25672 Stiffness of left ankle, not elsewhere classified: Secondary | ICD-10-CM | POA: Diagnosis not present

## 2022-07-05 DIAGNOSIS — M7918 Myalgia, other site: Secondary | ICD-10-CM | POA: Diagnosis not present

## 2022-07-05 DIAGNOSIS — M76811 Anterior tibial syndrome, right leg: Secondary | ICD-10-CM | POA: Diagnosis not present

## 2022-07-07 ENCOUNTER — Ambulatory Visit: Payer: BC Managed Care – PPO | Admitting: Sports Medicine

## 2022-07-10 DIAGNOSIS — M25672 Stiffness of left ankle, not elsewhere classified: Secondary | ICD-10-CM | POA: Diagnosis not present

## 2022-07-10 DIAGNOSIS — M76812 Anterior tibial syndrome, left leg: Secondary | ICD-10-CM | POA: Diagnosis not present

## 2022-07-10 DIAGNOSIS — M7918 Myalgia, other site: Secondary | ICD-10-CM | POA: Diagnosis not present

## 2022-07-10 DIAGNOSIS — M76811 Anterior tibial syndrome, right leg: Secondary | ICD-10-CM | POA: Diagnosis not present

## 2022-07-12 ENCOUNTER — Other Ambulatory Visit: Payer: Self-pay | Admitting: Physician Assistant

## 2022-07-12 MED ORDER — DICLOFENAC SODIUM 75 MG PO TBEC: 75.0000 mg | DELAYED_RELEASE_TABLET | Freq: Two times a day (BID) | ORAL | 0 refills | Status: DC

## 2022-07-17 DIAGNOSIS — M7918 Myalgia, other site: Secondary | ICD-10-CM | POA: Diagnosis not present

## 2022-07-17 DIAGNOSIS — M25672 Stiffness of left ankle, not elsewhere classified: Secondary | ICD-10-CM | POA: Diagnosis not present

## 2022-07-17 DIAGNOSIS — M76811 Anterior tibial syndrome, right leg: Secondary | ICD-10-CM | POA: Diagnosis not present

## 2022-07-17 DIAGNOSIS — M76812 Anterior tibial syndrome, left leg: Secondary | ICD-10-CM | POA: Diagnosis not present

## 2022-07-18 ENCOUNTER — Ambulatory Visit: Payer: BC Managed Care – PPO | Admitting: Sports Medicine

## 2022-07-18 DIAGNOSIS — M84369D Stress fracture, unspecified tibia and fibula, subsequent encounter for fracture with routine healing: Secondary | ICD-10-CM | POA: Diagnosis not present

## 2022-07-18 NOTE — Assessment & Plan Note (Signed)
This is a very pleasant 52 year old female, history of osteoarthritis and history of a tibial stress fracture, bilateral. This has since healed, she has the [employer] marathon coming up, she is more worried well at this point and is not really hurting, she is concerned that she has a tibial stress injury. On exam she has no discrete areas of tenderness to palpation along the anterior, or posterior tibia. She has good motion, good strength particularly to inversion of the ankle. She is able to jump up and down on both extremities without pain. I think she is cleared to do the marathon and I do not think she has any evidence of a stress injury.

## 2022-07-18 NOTE — Progress Notes (Signed)
    Procedures performed today:    None.  Independent interpretation of notes and tests performed by another provider:   None.  Brief History, Exam, Impression, and Recommendations:    History of tibial stress fracture This is a very pleasant 52 year old female, history of osteoarthritis and history of a tibial stress fracture, bilateral. This has since healed, she has the [employer] marathon coming up, she is more worried well at this point and is not really hurting, she is concerned that she has a tibial stress injury. On exam she has no discrete areas of tenderness to palpation along the anterior, or posterior tibia. She has good motion, good strength particularly to inversion of the ankle. She is able to jump up and down on both extremities without pain. I think she is cleared to do the marathon and I do not think she has any evidence of a stress injury.    ____________________________________________ Gwen Her. Dianah Field, M.D., ABFM., CAQSM., AME. Primary Care and Sports Medicine [facility]  Adjunct Professor of [place] [employer] of Medicine  Risk manager

## 2022-07-19 DIAGNOSIS — M25672 Stiffness of left ankle, not elsewhere classified: Secondary | ICD-10-CM | POA: Diagnosis not present

## 2022-07-19 DIAGNOSIS — M7918 Myalgia, other site: Secondary | ICD-10-CM | POA: Diagnosis not present

## 2022-07-19 DIAGNOSIS — M76812 Anterior tibial syndrome, left leg: Secondary | ICD-10-CM | POA: Diagnosis not present

## 2022-07-19 DIAGNOSIS — M76811 Anterior tibial syndrome, right leg: Secondary | ICD-10-CM | POA: Diagnosis not present

## 2022-08-28 ENCOUNTER — Other Ambulatory Visit: Payer: Self-pay | Admitting: Internal Medicine

## 2022-10-05 DIAGNOSIS — Z01419 Encounter for gynecological examination (general) (routine) without abnormal findings: Secondary | ICD-10-CM | POA: Diagnosis not present

## 2022-10-05 DIAGNOSIS — Z681 Body mass index (BMI) 19 or less, adult: Secondary | ICD-10-CM | POA: Diagnosis not present

## 2022-10-05 DIAGNOSIS — Z124 Encounter for screening for malignant neoplasm of cervix: Secondary | ICD-10-CM | POA: Diagnosis not present

## 2022-10-05 DIAGNOSIS — Z1151 Encounter for screening for human papillomavirus (HPV): Secondary | ICD-10-CM | POA: Diagnosis not present

## 2022-10-23 DIAGNOSIS — M85852 Other specified disorders of bone density and structure, left thigh: Secondary | ICD-10-CM | POA: Diagnosis not present

## 2022-10-23 DIAGNOSIS — M85851 Other specified disorders of bone density and structure, right thigh: Secondary | ICD-10-CM | POA: Diagnosis not present

## 2022-10-23 DIAGNOSIS — Z13820 Encounter for screening for osteoporosis: Secondary | ICD-10-CM | POA: Diagnosis not present

## 2022-11-07 ENCOUNTER — Encounter: Payer: Self-pay | Admitting: Internal Medicine

## 2022-11-07 ENCOUNTER — Ambulatory Visit: Payer: BC Managed Care – PPO | Attending: Internal Medicine | Admitting: Internal Medicine

## 2022-11-07 VITALS — BP 100/60 | HR 68 | Resp 14 | Ht 67.0 in | Wt 125.2 lb

## 2022-11-07 DIAGNOSIS — I491 Atrial premature depolarization: Secondary | ICD-10-CM

## 2022-11-07 DIAGNOSIS — E782 Mixed hyperlipidemia: Secondary | ICD-10-CM | POA: Diagnosis not present

## 2022-11-07 DIAGNOSIS — Z789 Other specified health status: Secondary | ICD-10-CM

## 2022-11-07 NOTE — Progress Notes (Signed)
Cardiology Office Note:    Date:  11/07/2022   ID:  Patty [name], DOB 1971/04/06, MRN [id number]  PCP:  Ardith Dark, MD  [facility] Cardiologist:  Riley Lam MD [facility] Electrophysiologist:  None   CC: HLD  History of Present Illness:    Patty [name] is a 52 y.o. female with a hx of Palpitations, HLD who presents for evaluation 07/07/20.   2022: In interim of this visit, patient had normal Echo, Zio, and CAC. In interim of this visit, patient ran the [facility].  2023: Has called in with myalgia concerns and follow up for LDL mgmt.Tolerated low dose pravastatin every other day. Had normal sleep study. 2024: Negative stress test. Did  [place]  and did great.  Felt well.   Patient notes that she is doing good..   Since last visit notes rare feeling in the arms but doesn't think its  related to her pravachol. There are no interval hospital/ED visit.    No chest pain or pressure .  No SOB/DOE and no PND/Orthopnea.  No weight gain or leg swelling.  No palpitations or syncope .   Past Medical History:  Diagnosis Date   Anxiety    History of recurrent UTIs    Hyperlipidemia    Palpitations    Sinus trouble     Past Surgical History:  Procedure Laterality Date   COLONOSCOPY  15 years ago    in CT-normal   ENDOMETRIAL ABLATION  10/16/2013   SEPTOPLASTY     1998 and 2000   TURBINATE REDUCTION  08/2021    Current Medications: Current Meds  Medication Sig   diclofenac (VOLTAREN) 75 MG EC tablet Take 1 tablet (75 mg total) by mouth 2 (two) times daily.   fluticasone (FLONASE) 50 MCG/ACT nasal spray Place into the nose as needed for allergies.   Hyaluronan (MONOVISC) 88 MG/4ML SOSY intra-articular injection Inject 1 syringe into each knee.  Dispense #2 syringes of MONOvisc.  Dx: Primary osteoarthritis of both knees. (Patient taking differently: Inject 1 syringe into each knee annually -  Dispense #2 syringes of MONOvisc.  Dx: Primary  osteoarthritis of both knees.)   Multiple Vitamin (MULTIVITAMIN) tablet Take 1 tablet by mouth daily.    pravastatin (PRAVACHOL) 20 MG tablet TAKE 1/2 TABLET BY MOUTH EVERY OTHER DAY TO EVERY DAY AS TOLERATED     Allergies:   Rosuvastatin calcium and Atorvastatin   Social History   Socioeconomic History   Marital status: Married    Spouse name: Not on file   Number of children: 2   Years of education: Not on file   Highest education level: Master's degree (e.g., MA, MS, MEng, MEd, MSW, MBA)  Occupational History   Occupation: Scientist, water quality  Tobacco Use   Smoking status: Never   Smokeless tobacco: Never  Vaping Use   Vaping Use: Never used  Substance and Sexual Activity   Alcohol use: Never    Comment: Social  beer/wine   Drug use: Never   Sexual activity: Yes    Partners: Male    Birth control/protection: None  Other Topics Concern   Not on file  Social History Narrative   Lives at home with husband and 2 children   Caffeine: 2 cups/day   Social Determinants of Health   Financial Resource Strain: Not on file  Food Insecurity: Not on file  Transportation Needs: Not on file  Physical Activity: Not on file  Stress: Not on file  Social Connections: Not on file    Social:  Reginia Naas ran  to the [employer] in 2022, moved oldest to college 2022-2023  Family History: The patient's family history includes Heart attack in her maternal grandfather and maternal grandmother; High Cholesterol in an other family member; Hypertension in her father. There is no history of Colon cancer, Colon polyps, Esophageal cancer, Rectal cancer, Stomach cancer, or Sleep apnea. History of coronary artery disease notable for maternal and paternal grandfather and grandmother. History of heart failure notable for no members. No history of cardiomyopathies including hypertrophic cardiomyopathy, left ventricular non-compaction, or arrhythmogenic right ventricular cardiomyopathy. History  of arrhythmia notable for no members. Denies family history of sudden cardiac death including drowning, car accidents, or unexplained deaths in the family. No history of bicuspid aortic valve or aortic aneurysm or dissection.   ROS:   Please see the history of present illness.     All other systems reviewed and are negative.  EKGs/Labs/Other Studies Reviewed:    The following studies were reviewed today:  EKG:   11/07/22: Sinus bradycardia; with bi-atrial enlargement (no evidence of AE on echo) 03/01/22: Sinus bradycardia rate 50 09/01/21: SR rate 67 05/20/21: SR rate 68 WNL 07/05/20:  Sinus Bradycardia rate 53; WNL  Cardiac Studies & Procedures     STRESS TESTS  ECHOCARDIOGRAM STRESS TEST 06/28/2022  Narrative EXERCISE STRESS ECHO REPORT   --------------------------------------------------------------------------------  Patient Name:   Patty [name] Date of Exam: 06/28/2022 Medical Rec #:  [id number]           Height:       67.5 in Accession #:    [id number]          Weight:       123.0 lb Date of Birth:  10-19-1970           BSA:          1.654 m Patient Age:    51 years            BP:           118/78 mmHg Patient Gender: F                   HR:           64 bpm. Exam Location:  [address]  Procedure: Limited Echo, Cardiac Doppler, Limited Color Doppler and Intracardiac Opacification Agent  Indications:    R07.9 Chest Pain  History:        Patient has prior history of Echocardiogram examinations, most recent 07/16/2020.  Sonographer:    Clearence Ped RCS Referring Phys: [id number] Tanara Turvey A Irean Kendricks  IMPRESSIONS   1. This is a negative stress echocardiogram for ischemia. 2. This is a low risk study.  FINDINGS  Exam Protocol: The patient exercised on a treadmill according to a Bruce protocol.   Patient Performance: The patient exercised for 14 minutes and 0 seconds, achieving 17.2 METS. The maximum stage achieved was V of the Bruce protocol. The heart  rate at peak stress was 171 bpm. The target heart rate was calculated to be 142 bpm. The percentage of maximum predicted heart rate achieved was 102.3 %. The baseline blood pressure was 118/78 mmHg. The blood pressure at peak stress was 153/71 mmHg. The blood pressure response was normal. The patient developed fatigue during the stress exam. The symptoms resolved with rest.  EKG: Resting EKG showed normal sinus rhythm. The patient developed no abnormal EKG findings during exercise.   2D  Echo Findings: Baseline regional wall motion abnormalities were not present. There were no stress-induced wall motion abnormalities. This is a negative stress echocardiogram for ischemia.   Thomasene Ripple DO Electronically signed on 06/28/2022 at 4:13:23 PM      Final   ECHOCARDIOGRAM  ECHOCARDIOGRAM COMPLETE 07/16/2020  Narrative ECHOCARDIOGRAM REPORT    Patient Name:   Patty [name] Date of Exam: 07/16/2020 Medical Rec #:  [id number]           Height:       67.0 in Accession #:    [id number]          Weight:       123.0 lb Date of Birth:  1970/11/01           BSA:          1.645 m Patient Age:    49 years            BP:           110/56 mmHg Patient Gender: F                   HR:           56 bpm. Exam Location:  Outpatient  Procedure: 2D Echo, Cardiac Doppler and Color Doppler  Indications:    Murmur 785.2 / R01.1  History:        Patient has no prior history of Echocardiogram examinations. Risk Factors:Non-Smoker and Dyslipidemia. Palpitations.  Sonographer:    Renella Cunas RDCS Referring Phys: [id number] [facility] A Nezar Buckles  IMPRESSIONS   1. Left ventricular ejection fraction, by estimation, is 55 to 60%. The left ventricle has normal function. The left ventricle has no regional wall motion abnormalities. Left ventricular diastolic parameters were normal. 2. Right ventricular systolic function is normal. The right ventricular size is normal. There is normal pulmonary artery  systolic pressure. 3. The mitral valve is normal in structure. Trivial mitral valve regurgitation. No evidence of mitral stenosis. 4. The aortic valve is normal in structure. Aortic valve regurgitation is not visualized. No aortic stenosis is present. 5. The inferior vena cava is normal in size with greater than 50% respiratory variability, suggesting right atrial pressure of 3 mmHg.  FINDINGS Left Ventricle: Left ventricular ejection fraction, by estimation, is 55 to 60%. The left ventricle has normal function. The left ventricle has no regional wall motion abnormalities. The left ventricular internal cavity size was normal in size. There is no left ventricular hypertrophy. Left ventricular diastolic parameters were normal.  Right Ventricle: The right ventricular size is normal. No increase in right ventricular wall thickness. Right ventricular systolic function is normal. There is normal pulmonary artery systolic pressure. The tricuspid regurgitant velocity is 2.06 m/s, and with an assumed right atrial pressure of 3 mmHg, the estimated right ventricular systolic pressure is 20.0 mmHg.  Left Atrium: Left atrial size was normal in size.  Right Atrium: Right atrial size was normal in size.  Pericardium: There is no evidence of pericardial effusion.  Mitral Valve: The mitral valve is normal in structure. Trivial mitral valve regurgitation. No evidence of mitral valve stenosis.  Tricuspid Valve: The tricuspid valve is normal in structure. Tricuspid valve regurgitation is trivial. No evidence of tricuspid stenosis.  Aortic Valve: The aortic valve is normal in structure. Aortic valve regurgitation is not visualized. No aortic stenosis is present.  Pulmonic Valve: The pulmonic valve was normal in structure. Pulmonic valve regurgitation is not visualized. No evidence of pulmonic stenosis.  Aorta:  The aortic root is normal in size and structure.  Venous: The inferior vena cava is normal in size  with greater than 50% respiratory variability, suggesting right atrial pressure of 3 mmHg.  IAS/Shunts: No atrial level shunt detected by color flow Doppler.   LEFT VENTRICLE PLAX 2D LVIDd:         4.60 cm      Diastology LVIDs:         3.40 cm      LV e' medial:    8.70 cm/s LV PW:         0.60 cm      LV E/e' medial:  7.7 LV IVS:        0.60 cm      LV e' lateral:   10.40 cm/s LVOT diam:     2.10 cm      LV E/e' lateral: 6.5 LV SV:         72 LV SV Index:   44 LVOT Area:     3.46 cm  LV Volumes (MOD) LV vol d, MOD A2C: 110.0 ml LV vol d, MOD A4C: 112.0 ml LV vol s, MOD A2C: 45.3 ml LV vol s, MOD A4C: 51.6 ml LV SV MOD A2C:     64.7 ml LV SV MOD A4C:     112.0 ml LV SV MOD BP:      64.2 ml  RIGHT VENTRICLE RV S prime:     13.30 cm/s TAPSE (M-mode): 2.7 cm  LEFT ATRIUM             Index       RIGHT ATRIUM           Index LA diam:        2.70 cm 1.64 cm/m  RA Area:     10.60 cm LA Vol (A2C):   33.8 ml 20.55 ml/m RA Volume:   21.60 ml  13.13 ml/m LA Vol (A4C):   19.6 ml 11.92 ml/m LA Biplane Vol: 27.8 ml 16.90 ml/m AORTIC VALVE LVOT Vmax:   91.40 cm/s LVOT Vmean:  62.300 cm/s LVOT VTI:    0.207 m  AORTA Ao Root diam: 2.70 cm  MITRAL VALVE               TRICUSPID VALVE MV Area (PHT): 3.60 cm    TR Peak grad:   17.0 mmHg MV Decel Time: 211 msec    TR Vmax:        206.00 cm/s MV E velocity: 67.30 cm/s MV A velocity: 59.60 cm/s  SHUNTS MV E/A ratio:  1.13        Systemic VTI:  0.21 m Systemic Diam: 2.10 cm  Mihai Croitoru MD Electronically signed by Thurmon Fair MD Signature Date/Time: 07/16/2020/10:46:23 AM    Final    MONITORS  LONG TERM MONITOR (3-14 DAYS) 08/05/2020  Narrative  Patient had a minimum heart rate of 44 bpm, maximum heart rate of 182 bpm, and average heart rate of 70 bpm.  Predominant underlying rhythm was sinus rhythm.  Two runs of supraventricular tachycardia occurred lasting 6 beats at longest with a max rate of 136 bpm at  fastest.  Isolated PACs were rare (<1.0%), with rare couplets and triplets present.  Isolated PVCs were rare (<1.0%), with rare couplets present.  No evidence of complete heart block.  Triggered and diary events associated with sinus rhythm (algorithrm also reads for SVE but there is no post extra-systolic pause).  No malignant arrhythmias.  CT SCANS  CT CARDIAC SCORING (SELF PAY ONLY) 07/21/2020  Addendum 07/21/2020  5:39 PM ADDENDUM REPORT: 07/21/2020 17:36  CLINICAL DATA:  Risk stratification: 52 Year-old Hispanic Female  EXAM: Coronary Calcium Score  TECHNIQUE: The patient was scanned on a [employer]. Axial non-contrast 3 mm slices were carried out through the heart. The data set was analyzed on a dedicated work station and scored using the Agatson method.  FINDINGS: Non-cardiac: See separate report from [facility].  Ascending Aorta: Normal caliber.  Pericardium: Normal.  Coronary arteries: Normal origins.  Coronary calcium score of 0. This was 1st percentile for age, gender, and race matched controls.  IMPRESSION: 1. Coronary calcium score of 0. This was 1st percentile for age, gender, and race matched controls.  Riley Lam, MD   Electronically Signed By: Riley Lam MD On: 07/21/2020 17:36  Narrative EXAM: OVER-READ INTERPRETATION  CT CHEST  The following report is an over-read performed by radiologist Dr. Trudie Reed of [facility] on 07/21/2020. This over-read does not include interpretation of cardiac or coronary anatomy or pathology. The coronary calcium score interpretation by the cardiologist is attached.  COMPARISON:  None.  FINDINGS: Within the visualized portions of the thorax there are no suspicious appearing pulmonary nodules or masses, there is no acute consolidative airspace disease, no pleural effusions, no pneumothorax and no lymphadenopathy. Visualized portions of  the upper abdomen are unremarkable. There are no aggressive appearing lytic or blastic lesions noted in the visualized portions of the skeleton.  IMPRESSION: No significant incidental noncardiac findings are noted.  Electronically Signed: By: Trudie Reed M.D. On: 07/21/2020 13:29           Recent Labs: 03/28/2022: ALT 19; BUN 11; Creatinine, Ser 0.71; Hemoglobin 11.8; Platelets 263.0; Potassium 3.9; Sodium 137 04/26/2022: TSH 3.08  Recent Lipid Panel    Component Value Date/Time   CHOL 190 03/28/2022 1445   CHOL 186 01/20/2022 0856   TRIG 66.0 03/28/2022 1445   HDL 83.00 03/28/2022 1445   HDL 79 01/20/2022 0856   CHOLHDL 2 03/28/2022 1445   VLDL 13.2 03/28/2022 1445   LDLCALC 93 03/28/2022 1445   LDLCALC 94 01/20/2022 0856   LDLCALC 113 (H) 02/10/2020 1011    Physical Exam:    VS:  BP 100/60   Pulse 68   Resp 14   Ht 5\' 7"  (1.702 m)   Wt 125 lb 3.2 oz (56.8 kg)   SpO2 96%   BMI 19.61 kg/m     Wt Readings from Last 3 Encounters:  11/07/22 125 lb 3.2 oz (56.8 kg)  06/12/22 123 lb (55.8 kg)  06/01/22 122 lb 3.2 oz (55.4 kg)    Gen: NAD Neck: No JVD Cardiac: No Rubs or Gallops, no murmur, normal rhythm +2 radial pulses Respiratory: Clear to auscultation bilaterally, normal effort, normal  respiratory rate  GI: Soft, nontender, non-distended  MS: No edema;  moves all extremities Integument: Skin feels warm Neuro:  At time of evaluation, alert and oriented to person/place/time/situation  Psych: Normal affect  ASSESSMENT:    1. Mixed hyperlipidemia   2. PAC (premature atrial contraction)   3. Statin intolerance     PLAN:    Chest pain - zero calcium score - this is non cardiac and feels better  Hyperlipidemia (mixed) Statin intolerance presently on pravastatin 10 mg; now able to tolerate Q48 - LDL < 100 - LDL direct, ALT   PACs - no plans for PRN AV nodal agents  One year with team       Medication Adjustments/Labs and Tests  Ordered: Current medicines are reviewed at length with the patient today.  Concerns regarding medicines are outlined above.  Orders Placed This Encounter  Procedures   Lipid panel   Lipoprotein A (LPA)   ALT   EKG 12-Lead    No orders of the defined types were placed in this encounter.    Patient Instructions  Medication Instructions:  Your physician recommends that you continue on your current medications as directed. Please refer to the Current Medication list given to you today.  *If you need a refill on your cardiac medications before your next appointment, please call your pharmacy*   Lab Work: FLP, Lpa, ALT  If you have labs (blood work) drawn today and your tests are completely normal, you will receive your results only by: MyChart Message (if you have MyChart) OR A paper copy in the mail If you have any lab test that is abnormal or we need to change your treatment, we will call you to review the results.   Testing/Procedures: NONE   Follow-Up: At [facility], you and your health needs are our priority.  As part of our continuing mission to provide you with exceptional heart care, we have created designated Provider Care Teams.  These Care Teams include your primary Cardiologist (physician) and Advanced Practice Providers (APPs -  Physician Assistants and Nurse Practitioners) who all work together to provide you with the care you need, when you need it.    Your next appointment:   1 year(s)  Provider:   Christell Constant, MD        Signed, Christell Constant, MD  11/07/2022 9:20 AM    [facility]

## 2022-11-07 NOTE — Patient Instructions (Signed)
Medication Instructions:  Your physician recommends that you continue on your current medications as directed. Please refer to the Current Medication list given to you today.  *If you need a refill on your cardiac medications before your next appointment, please call your pharmacy*   Lab Work: FLP, Lpa, ALT  If you have labs (blood work) drawn today and your tests are completely normal, you will receive your results only by: MyChart Message (if you have MyChart) OR A paper copy in the mail If you have any lab test that is abnormal or we need to change your treatment, we will call you to review the results.   Testing/Procedures: NONE   Follow-Up: At [facility], you and your health needs are our priority.  As part of our continuing mission to provide you with exceptional heart care, we have created designated Provider Care Teams.  These Care Teams include your primary Cardiologist (physician) and Advanced Practice Providers (APPs -  Physician Assistants and Nurse Practitioners) who all work together to provide you with the care you need, when you need it.    Your next appointment:   1 year(s)  Provider:   Christell Constant, MD

## 2022-11-08 LAB — LIPID PANEL
Chol/HDL Ratio: 2.3 ratio (ref 0.0–4.4)
Cholesterol, Total: 202 mg/dL — ABNORMAL HIGH (ref 100–199)
HDL: 89 mg/dL (ref >39)
LDL Chol Calc (NIH): 100 mg/dL — ABNORMAL HIGH (ref 0–99)
Triglycerides: 74 mg/dL (ref 0–149)
VLDL Cholesterol Cal: 13 mg/dL (ref 5–40)

## 2022-11-08 LAB — ALT: ALT: 24 IU/L (ref 0–32)

## 2022-11-08 LAB — LIPOPROTEIN A (LPA): Lipoprotein (a): 198.4 nmol/L — ABNORMAL HIGH (ref <75.0)

## 2022-11-10 ENCOUNTER — Telehealth: Payer: Self-pay

## 2022-11-10 DIAGNOSIS — E782 Mixed hyperlipidemia: Secondary | ICD-10-CM

## 2022-11-10 DIAGNOSIS — E7841 Elevated Lipoprotein(a): Secondary | ICD-10-CM

## 2022-11-10 MED ORDER — PRAVASTATIN SODIUM 10 MG PO TABS: 10.0000 mg | ORAL_TABLET | Freq: Every day | ORAL | 3 refills | Status: DC

## 2022-11-10 NOTE — Telephone Encounter (Signed)
Results and MD comments viewed on my chart.  Sent pt a my chart requesting date for 3 month f/u labs.

## 2022-11-10 NOTE — Telephone Encounter (Signed)
-----   Message from Christell Constant, MD sent at 11/10/2022  9:51 AM EDT ----- She was taking Q48 hrs. Is she back to taking it daily now? ----- Message ----- From: Macie Burows, RN Sent: 11/10/2022   9:47 AM EDT To: Christell Constant, MD  Pt taking pravastatin 10 according to med list.  Alena Bills, RN  ----- Message ----- From: Christell Constant, MD Sent: 11/08/2022   7:45 PM EDT To: Ardith Dark, MD; Macie Burows, RN  Results: Lp(a) elevated, LDL slightly increased form prior Plan: Pravastatin 10 mg Daily if able to tolerate  Christell Constant, MD

## 2022-11-17 DIAGNOSIS — H66012 Acute suppurative otitis media with spontaneous rupture of ear drum, left ear: Secondary | ICD-10-CM | POA: Diagnosis not present

## 2022-11-28 ENCOUNTER — Ambulatory Visit: Payer: BC Managed Care – PPO | Admitting: Family Medicine

## 2022-11-28 VITALS — BP 105/65 | HR 56 | Temp 98.0°F | Ht 67.0 in | Wt 123.0 lb

## 2022-11-28 DIAGNOSIS — M79645 Pain in left finger(s): Secondary | ICD-10-CM

## 2022-11-28 DIAGNOSIS — J309 Allergic rhinitis, unspecified: Secondary | ICD-10-CM

## 2022-11-28 DIAGNOSIS — E782 Mixed hyperlipidemia: Secondary | ICD-10-CM | POA: Diagnosis not present

## 2022-11-28 DIAGNOSIS — H6992 Unspecified Eustachian tube disorder, left ear: Secondary | ICD-10-CM

## 2022-11-28 MED ORDER — DICLOFENAC SODIUM 75 MG PO TBEC: 75.0000 mg | DELAYED_RELEASE_TABLET | Freq: Two times a day (BID) | ORAL | 0 refills | Status: DC

## 2022-11-28 NOTE — Assessment & Plan Note (Signed)
Following with cardiology.  Request that we check labs today.  She is on pravastatin 10 mg daily.

## 2022-11-28 NOTE — Progress Notes (Signed)
   Patty [name] is a 52 y.o. female who presents today for an office visit.  Assessment/Plan:  New/Acute Problems: Finger Pain Likely mild finger sprain.  No obvious trauma/doubt fracture and do not think we need to get x-rays today.  We discussed buddy taping.  We also discussed home exercises and handout was given.  She can use ice as needed.  Will refill diclofenac which has worked well for her.  She will follow-up with sports medicine if not improving   Eustachian Tube Dysfunction  No red flags.  No signs of infection on exam today.  It is okay for her to stop her ofloxacin drops.  Recommended daily Flonase.  She will follow-up with ENT if not improving.  Chronic Problems Addressed Today: Mixed hyperlipidemia Following with cardiology.  Request that we check labs today.  She is on pravastatin 10 mg daily.  Allergic rhinitis Continue daily Flonase as above.      Subjective:  HPI:  See A/P for status of chronic conditions.  Main concern today is left ring finger swelling.  This started several days ago after playing pickle ball.  No obvious injuries or precipitating events however after playing pickleball she noticed swelling to the distal aspect of her left fourth digit.  She did have some pain upon palpation.  Swelling has improved over the last day or so.  No specific treatments tried.  She is right-handed.  She has never had anything like this happen before.  She also did have an ear infection about a week and a half ago.  Went to urgent care and was given prescription for azithromycin and ofloxacin eardrops.  Still has some discomfort in her left ear but overall symptoms do seem to be improving.        Objective:  Physical Exam: BP 105/65   Pulse (!) 56   Temp 98 F (36.7 C) (Temporal)   Ht 5\' 7"  (1.702 m)   Wt 123 lb (55.8 kg)   SpO2 97%   BMI 19.26 kg/m   Gen: No acute distress, resting comfortably HEENT: Left TM with clear effusion.  Right TM  clear MUSCULOSKELETAL: - Left Hand: Joints with hypertrophy noted.  Slight ecchymosis noted to distal aspect of left fourth digit.  Left fourth digit neurovascular intact distally.  Stable with axial joint loading.  No pain with radial or ulnar stress.  Neuro: Grossly normal, moves all extremities Psych: Normal affect and thought content      Diquan Kassis M. Jimmey Ralph, MD 11/28/2022 7:50 AM

## 2022-11-28 NOTE — Addendum Note (Signed)
Addended by: Dyann Kief on: 11/28/2022 08:44 AM   Modules accepted: Orders

## 2022-11-28 NOTE — Patient Instructions (Signed)
It was very nice to see you today!  I think you probably sprained your finger.  Work on the exercises.  You can buddy tape your fingers if needed.  Also take the diclofenac.  Let us know if not improving.  He did have some mild fluid buildup behind your ears.  You do not have any signs of an infection today.  It is okay for you to stop the drops.  Please schedule appointment with ENT if not improving.  Will check blood work today.  Return if symptoms worsen or fail to improve.   Take care, Dr Jimmey Ralph  PLEASE NOTE:  If you had any lab tests, please let us know if you have not heard back within a few days. You may see your results on mychart before we have a chance to review them but we will give you a call once they are reviewed by [place].   If we ordered any referrals today, please let us know if you have not heard from their office within the next week.   If you had any urgent prescriptions sent in today, please check with the pharmacy within an hour of our visit to make sure the prescription was transmitted appropriately.   Please try these tips to maintain a healthy lifestyle:  Eat at least 3 REAL meals and 1-2 snacks per day.  Aim for no more than 5 hours between eating.  If you eat breakfast, please do so within one hour of getting up.   Each meal should contain half fruits/vegetables, one quarter protein, and one quarter carbs (no bigger than a computer mouse)  Cut down on sweet beverages. This includes juice, soda, and sweet tea.   Drink at least 1 glass of water with each meal and aim for at least 8 glasses per day  Exercise at least 150 minutes every week.

## 2022-11-28 NOTE — Assessment & Plan Note (Signed)
Continue daily Flonase as above.

## 2022-11-29 ENCOUNTER — Other Ambulatory Visit: Payer: BC Managed Care – PPO

## 2022-11-29 ENCOUNTER — Encounter: Payer: Self-pay | Admitting: Family Medicine

## 2022-12-06 ENCOUNTER — Telehealth: Payer: Self-pay | Admitting: Sports Medicine

## 2022-12-06 ENCOUNTER — Encounter: Payer: Self-pay | Admitting: Sports Medicine

## 2022-12-06 NOTE — Telephone Encounter (Signed)
Hey Patty [name] would you please work on [employer], she prefers the single shots, both knees, x-ray confirmed she has done well in the past with Monovisc.

## 2022-12-06 NOTE — Telephone Encounter (Signed)
PA information submitted via [email or web address] for [employer] Paperwork has been printed and given to Dr. T for signatures. Once obtained, information will be faxed to [employer] at [phone]  

## 2022-12-14 ENCOUNTER — Ambulatory Visit: Payer: BC Managed Care – PPO | Admitting: Sports Medicine

## 2022-12-14 ENCOUNTER — Ambulatory Visit (INDEPENDENT_AMBULATORY_CARE_PROVIDER_SITE_OTHER): Payer: BC Managed Care – PPO

## 2022-12-14 DIAGNOSIS — Z0189 Encounter for other specified special examinations: Secondary | ICD-10-CM

## 2022-12-14 DIAGNOSIS — S83206A Unspecified tear of unspecified meniscus, current injury, right knee, initial encounter: Secondary | ICD-10-CM

## 2022-12-14 DIAGNOSIS — M25461 Effusion, right knee: Secondary | ICD-10-CM | POA: Diagnosis not present

## 2022-12-14 DIAGNOSIS — M25561 Pain in right knee: Secondary | ICD-10-CM | POA: Diagnosis not present

## 2022-12-14 NOTE — Progress Notes (Signed)
    Procedures performed today:    None.  Independent interpretation of notes and tests performed by another provider:   None.  Brief History, Exam, Impression, and Recommendations:    Acute meniscal tear of right knee Pleasant 52 year old female, she does have knee osteoarthritis, more recently was walking, took a misstep and twisted and felt sharp pain posterior/medial joint line, she had some locking of the knee, swelling. She applied ice and compression, swelling has improved but she does have tenderness posterior/medial joint line, pain with terminal flexion, positive McMurray's sign, she is unable to flex past about 90 degrees consistent with mechanical locking.   I do think she she has an acute meniscal tear. She does need an urgent MRI for a locked knee.    ____________________________________________ Ihor Austin. Benjamin Stain, M.D., ABFM., CAQSM., AME. Primary Care and Sports Medicine [facility]  Adjunct Professor of Family Medicine  [employer] [facility] [employer]  Restaurant manager, fast food

## 2022-12-14 NOTE — Assessment & Plan Note (Signed)
Pleasant 52 year old female, she does have knee osteoarthritis, more recently was walking, took a misstep and twisted and felt sharp pain posterior/medial joint line, she had some locking of the knee, swelling. She applied ice and compression, swelling has improved but she does have tenderness posterior/medial joint line, pain with terminal flexion, positive McMurray's sign, she is unable to flex past about 90 degrees consistent with mechanical locking.   I do think she she has an acute meniscal tear. She does need an urgent MRI for a locked knee.

## 2022-12-18 ENCOUNTER — Ambulatory Visit (INDEPENDENT_AMBULATORY_CARE_PROVIDER_SITE_OTHER): Payer: BC Managed Care – PPO

## 2022-12-18 DIAGNOSIS — S83206A Unspecified tear of unspecified meniscus, current injury, right knee, initial encounter: Secondary | ICD-10-CM | POA: Diagnosis not present

## 2022-12-18 DIAGNOSIS — S83241A Other tear of medial meniscus, current injury, right knee, initial encounter: Secondary | ICD-10-CM | POA: Diagnosis not present

## 2022-12-18 DIAGNOSIS — M25461 Effusion, right knee: Secondary | ICD-10-CM | POA: Diagnosis not present

## 2022-12-20 NOTE — Telephone Encounter (Signed)
Benefits Investigation Details received from [employer] Injection: Monovisc PA required: Yes PA form submitted online May fill through: Buy and Bill [facility] Specialty Pharmacy [place] Copay/Coinsurance: $70 Product Copay: 0% Administration Coinsurance: 0% Administration Copay: 0% Deductible: $2500 (met: $2260.16) Out of Pocket Max: $3000 (met: $2552.56)

## 2022-12-21 ENCOUNTER — Other Ambulatory Visit (INDEPENDENT_AMBULATORY_CARE_PROVIDER_SITE_OTHER): Payer: BC Managed Care – PPO

## 2022-12-21 DIAGNOSIS — E782 Mixed hyperlipidemia: Secondary | ICD-10-CM

## 2022-12-21 LAB — COMPREHENSIVE METABOLIC PANEL
ALT: 29 U/L (ref 0–35)
AST: 32 U/L (ref 0–37)
Albumin: 4.4 g/dL (ref 3.5–5.2)
Alkaline Phosphatase: 60 U/L (ref 39–117)
BUN: 15 mg/dL (ref 6–23)
CO2: 32 mEq/L (ref 19–32)
Calcium: 9.6 mg/dL (ref 8.4–10.5)
Chloride: 104 mEq/L (ref 96–112)
Creatinine, Ser: 0.77 mg/dL (ref 0.40–1.20)
GFR: 88.12 mL/min (ref >60.00)
Glucose, Bld: 87 mg/dL (ref 70–99)
Potassium: 4.2 mEq/L (ref 3.5–5.1)
Sodium: 142 mEq/L (ref 135–145)
Total Bilirubin: 1 mg/dL (ref 0.2–1.2)
Total Protein: 6.7 g/dL (ref 6.0–8.3)

## 2022-12-21 LAB — CBC
HCT: 38.6 % (ref 36.0–46.0)
Hemoglobin: 12.4 g/dL (ref 12.0–15.0)
MCHC: 32.2 g/dL (ref 30.0–36.0)
MCV: 93.8 fl (ref 78.0–100.0)
Platelets: 255 10*3/uL (ref 150.0–400.0)
RBC: 4.12 Mil/uL (ref 3.87–5.11)
RDW: 14.1 % (ref 11.5–15.5)
WBC: 4.1 10*3/uL (ref 4.0–10.5)

## 2022-12-21 LAB — LIPID PANEL
Cholesterol: 193 mg/dL (ref 0–200)
HDL: 76.7 mg/dL (ref >39.00)
LDL Cholesterol: 89 mg/dL (ref 0–99)
NonHDL: 116.55
Total CHOL/HDL Ratio: 3
Triglycerides: 137 mg/dL (ref 0.0–149.0)
VLDL: 27.4 mg/dL (ref 0.0–40.0)

## 2022-12-21 LAB — T4, FREE: Free T4: 0.89 ng/dL (ref 0.60–1.60)

## 2022-12-21 LAB — TSH: TSH: 1.19 u[IU]/mL (ref 0.35–5.50)

## 2022-12-21 LAB — T3, FREE: T3, Free: 3.1 pg/mL (ref 2.3–4.2)

## 2022-12-25 NOTE — Progress Notes (Signed)
Great news!  Labs are all normal.  We can recheck in a year or so.

## 2022-12-29 ENCOUNTER — Telehealth: Payer: BC Managed Care – PPO | Admitting: Sports Medicine

## 2022-12-29 DIAGNOSIS — M170 Bilateral primary osteoarthritis of knee: Secondary | ICD-10-CM | POA: Diagnosis not present

## 2022-12-29 DIAGNOSIS — S83206D Unspecified tear of unspecified meniscus, current injury, right knee, subsequent encounter: Secondary | ICD-10-CM

## 2022-12-29 MED ORDER — DICLOFENAC SODIUM 75 MG PO TBEC: 75.0000 mg | DELAYED_RELEASE_TABLET | Freq: Two times a day (BID) | ORAL | 3 refills | Status: DC

## 2022-12-29 NOTE — Telephone Encounter (Signed)
Looks like the [employer] benefits details showed that she may fill it through buy and bill or the specialty pharmacy.  If you agree then lets go ahead and order 1 syringe of Monovisc for her.

## 2022-12-29 NOTE — Assessment & Plan Note (Signed)
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 She is doing some Voltaren now, she is requesting physical therapy which I think is a great idea, she really does not have any locking, pain is very minimal at this point. I advised her that considering the size of the meniscus tear I did want her to get an opinion from Dr. Everardo Pacific but at this point I am not placing any restrictions on her, due to the minimal discomfort and the osteoarthritis seen in the MRI we will go and proceed with ordering Monovisc for her, this has worked well in the past. Return to see me for Monovisc injection.

## 2022-12-29 NOTE — Progress Notes (Signed)
   Virtual Visit via WebEx/MyChart   I connected with  Patty [name]  on 12/29/22 via WebEx/MyChart/Doximity Video and verified that I am speaking with the correct person using two identifiers.   I discussed the limitations, risks, security and privacy concerns of performing an evaluation and management service by WebEx/MyChart/Doximity Video, including the higher likelihood of inaccurate diagnosis and treatment, and the availability of in person appointments.  We also discussed the likely need of an additional face to face encounter for complete and high quality delivery of care.  I also discussed with the patient that there may be a patient responsible charge related to this service. The patient expressed understanding and wishes to proceed.  Provider location is in medical facility. Patient location is at their home, different from provider location. People involved in care of the patient during this telehealth encounter were myself, my nurse/medical assistant, and my front office/scheduling team member.  Review of Systems: No fevers, chills, night sweats, weight loss, chest pain, or shortness of breath.   Objective Findings:    General: Speaking full sentences, no audible heavy breathing.  Sounds alert and appropriately interactive.  Appears well.  Face symmetric.  Extraocular movements intact.  Pupils equal and round.  No nasal flaring or accessory muscle use visualized.  Independent interpretation of tests performed by another provider:   None.  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 She is doing some Voltaren now, she is requesting physical therapy which I think is a great idea,  she really does not have any locking, pain is very minimal at this point. I advised her that considering the size of the meniscus tear I did want her to get an opinion from Dr. Everardo Pacific but at this point I am not placing any restrictions on her, due to the minimal discomfort and the osteoarthritis seen in the MRI we will go and proceed with ordering Monovisc for her, this has worked well in the past. Return to see me for Monovisc injection.  Primary osteoarthritis of both knees Patty [name] returns, she has known knee osteoarthritis, last Monovisc injection was bilateral in November 2023. She is not having a bit of recurrence of discomfort right knee so we will go ahead and order some Monovisc for her. Please see assessment and plan above.   I discussed the above assessment and treatment plan with the patient. The patient was provided an opportunity to ask questions and all were answered. The patient agreed with the plan and demonstrated an understanding of the instructions.   The patient was advised to call back or seek an in-person evaluation if the symptoms worsen or if the condition fails to improve as anticipated.   I provided 30 minutes of face to face and non-face-to-face time during this encounter date, time was needed to gather information, review chart, records, communicate/coordinate with staff remotely, as well as complete documentation.   ____________________________________________ Ihor Austin. Benjamin Stain, M.D., ABFM., CAQSM., AME. Primary Care and Sports Medicine [facility]  Adjunct Professor of Family Medicine  [employer] [facility] [employer]  Restaurant manager, fast food

## 2022-12-29 NOTE — Telephone Encounter (Addendum)
I submitted a PA firm one [employer] website Monday and got confirmation and when it submitted it and I called to check the status today and they states the never received it so I resubmitted it again and I notified patient what was going on.

## 2022-12-29 NOTE — Assessment & Plan Note (Signed)
Patty [name] returns, she has known knee osteoarthritis, last Monovisc injection was bilateral in November 2023. She is not having a bit of recurrence of discomfort right knee so we will go ahead and order some Monovisc for her. Please see assessment and plan above.

## 2023-01-01 DIAGNOSIS — H6123 Impacted cerumen, bilateral: Secondary | ICD-10-CM | POA: Diagnosis not present

## 2023-01-02 ENCOUNTER — Other Ambulatory Visit: Payer: Self-pay

## 2023-01-02 ENCOUNTER — Encounter: Payer: Self-pay | Admitting: Rehabilitative and Restorative Service Providers"

## 2023-01-02 ENCOUNTER — Ambulatory Visit: Payer: BC Managed Care – PPO | Admitting: Rehabilitative and Restorative Service Providers"

## 2023-01-02 DIAGNOSIS — S83206D Unspecified tear of unspecified meniscus, current injury, right knee, subsequent encounter: Secondary | ICD-10-CM | POA: Diagnosis not present

## 2023-01-02 DIAGNOSIS — R2689 Other abnormalities of gait and mobility: Secondary | ICD-10-CM

## 2023-01-02 DIAGNOSIS — M6281 Muscle weakness (generalized): Secondary | ICD-10-CM | POA: Diagnosis present

## 2023-01-02 DIAGNOSIS — R252 Cramp and spasm: Secondary | ICD-10-CM | POA: Diagnosis present

## 2023-01-02 NOTE — Patient Instructions (Signed)
     [facility] Welcome to [facility]! Here you will find all the information you will need regarding your pool therapy. If you have further questions at any time, please call our office at [phone]. After completing your initial evaluation in the [facility], you may be eligible to complete a portion of your therapy in the pool. A typical week of therapy will consist of 1-2 typical physical therapy visits at our [facility] location and an additional session of therapy in the pool located at the [facility] [address]. [address], [place] [address]. The phone number at the pool site is [phone]. Please call this number if you are running late or need to cancel your appointment.  Aquatic therapy will be offered on Wednesday mornings and Friday afternoons. Each session will last approximately 45 minutes. All scheduling and payments for aquatic therapy sessions, including cancelations, will be done through our [facility] location.  To be eligible for aquatic therapy, these criteria must be met: You must be able to independently change in the locker room and get to the pool deck. A caregiver can come with you to help if needed. There are benches for a caregiver to sit on next to the pool. No one with an open wound is permitted in the pool.  Handicap parking is available in the front and there is a drop off option for even closer accessibility. Please arrive 15 minutes prior to your appointment to prepare for your pool session. You must sign in at the front desk upon your arrival. Please be sure to attend to any toileting needs prior to entering the pool. Locker rooms for changing are available.  There is direct access to the pool deck from the locker room. You can lock your belongings in a locker or bring them with you poolside. Your therapist will greet you on the pool deck. There may be other swimmers in the pool at the  same time but your session is one-on-one with the therapist.   

## 2023-01-02 NOTE — Therapy (Signed)
OUTPATIENT PHYSICAL THERAPY LOWER EXTREMITY EVALUATION   Patient Name: Patty [name] MRN: [id number] DOB:01-31-1971, 52 y.o., female Today's Date: 01/02/2023  END OF SESSION:  PT End of Session - 01/02/23 1236     Visit Number 1    Date for PT Re-Evaluation 02/23/23    Authorization Type BC/BS    PT Start Time 1228    PT Stop Time 1310    PT Time Calculation (min) 42 min    Activity Tolerance Patient tolerated treatment well    Behavior During Therapy WFL for tasks assessed/performed             Past Medical History:  Diagnosis Date   Anxiety    History of recurrent UTIs    Hyperlipidemia    Palpitations    Sinus trouble    Past Surgical History:  Procedure Laterality Date   COLONOSCOPY  15 years ago    in CT-normal   ENDOMETRIAL ABLATION  10/16/2013   SEPTOPLASTY     1998 and 2000   TURBINATE REDUCTION  08/2021   Patient Active Problem List   Diagnosis Date Noted   Acute meniscal tear of right knee 12/14/2022   Allergic rhinitis 11/28/2022   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   Sleep-disordered breathing 07/01/2021   Statin intolerance 05/23/2021   Costochondritis 01/26/2021   Polyarthralgia 01/26/2021   PAC (premature atrial contraction) 08/12/2020   Mixed hyperlipidemia 07/07/2020   Primary osteoarthritis of both knees 08/21/2017   History of tibial stress fracture 08/16/2015    PCP: Ardith Dark, MD  REFERRING PROVIDER: Monica Becton, MD  REFERRING DIAG: S83.206D (ICD-10-CM) - Acute meniscal tear of right knee, subsequent encounter  THERAPY DIAG:  Other abnormalities of gait and mobility - Plan: PT plan of care cert/re-cert  Cramp and spasm - Plan: PT plan of care cert/re-cert  Muscle weakness (generalized) - Plan: PT plan of care cert/re-cert  Rationale for Evaluation and Treatment: Rehabilitation  ONSET DATE: December 11, 2022  SUBJECTIVE:   SUBJECTIVE STATEMENT: Pt is a marathon runner.  Was recently walking and took a  misstep and twisted and felt a sharp pain in her knee with swelling.  Patient had a MRI which revealed an acute meniscus tear.  Pt reports initial small meniscus tear from November 2018, but she has been able to manage.  Pt states that on July 8th, she took a misstep and her knee almost felt like it locked.  Patient has been having increased pain since then and has not been able to resume running.  PERTINENT HISTORY: OA with last Monovisc injection November 2023 PAIN:  Are you having pain? Yes: NPRS scale: 3-4/10 Pain location: right knee (but has history of left knee pain, as well) Pain description: throbbing Aggravating factors: inactivity/prolonged sitting and standing Relieving factors: movement  PRECAUTIONS: None  RED FLAGS: None   WEIGHT BEARING RESTRICTIONS: No  FALLS:  Has patient fallen in last 6 months? 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  Pt has a planned marathon in [place] in December 2024 that she is hoping to be able to complete.  NEXT MD VISIT: Dr Everardo Pacific 01/05/2023, Dr Benjamin Stain once  injections approved.  OBJECTIVE:   DIAGNOSTIC FINDINGS:  Right knee MRI on 12/18/2022: IMPRESSION: 1. Large oblique undersurface tear of the posterior horn and body of the medial meniscus, as described. 2. The lateral meniscus, cruciate and collateral ligaments are intact. 3. Mildly progressive tricompartmental degenerative chondrosis with small joint effusion. No acute osseous findings.  PATIENT SURVEYS:  Eval:  FOTO 55 (projected 72 by visit 13)  COGNITION: Overall cognitive status: Within  functional limits for tasks assessed     SENSATION: WFL   MUSCLE LENGTH: Hamstrings: right slightly tighter than left LE  POSTURE: No Significant postural limitations   LOWER EXTREMITY ROM:  WFL  LOWER EXTREMITY MMT:  01/02/2023: Right LE strength of 5-/5 grossly throughout Left LE strength is WFL  LOWER EXTREMITY SPECIAL TESTS:  Knee special tests: Anterior drawer test: negative and Posterior drawer test: negative  FUNCTIONAL TESTS:  01/02/2023: 5 times sit to stand: 8.55 sec Single Leg Stance: 30 seconds on bilateral LE  Forward T assessment:  increased instability noted with RLE  GAIT: Distance walked: 15-20 min Assistive device utilized: None Level of assistance: Complete Independence Comments: Pt reports that when she walks for too long, she starts feel some discomfort on medial aspect of right knee.   TODAY'S TREATMENT:                                                                                                                              DATE: 01/02/2023 Reviewed HEP Provided pt with a blue theraband Provided handout for aquatic PT    PATIENT EDUCATION:  Education details: Issued HEP Person educated: Patient Education method: Explanation, Demonstration, and Handouts Education comprehension: verbalized understanding and returned demonstration  HOME EXERCISE PROGRAM: Access Code: [email or web address] URL: [email or web address] Date: 01/02/2023 Prepared by: Clydie Braun Asriel Westrup  Exercises - Seated Hamstring Stretch  - 1-2 x daily - 7 x weekly - 2 reps - 20 sec hold - Squat with Chair Touch  - 1-2 x daily - 7 x weekly - 2 sets - 10 reps - Side Stepping with Resistance at Ankles  - 1-2 x daily - 7 x weekly - 2 sets - 10 reps - Forward Monster Walks  - 1-2 x daily - 7 x weekly - 2 sets - 10 reps - Backward Monster Walks  - 1-2 x daily - 7 x weekly - 2 sets - 10 reps - Forward T  - 1-2 x daily - 7 x weekly - 2 sets - 10 reps  ASSESSMENT:  CLINICAL  IMPRESSION: Patient is a 52 y.o. female who was seen today for physical therapy evaluation and treatment for right meniscus tear. Patient's PLOF is independent as a marathon runner.  Patient has had a small meniscus tear since November of 2018 and has a history of bilat knee OA.  Patient has been receiving yearly knee injections with last injection in November 2023.  Patient states that on July 8th, she had a misstep and felt some  pain and locking in her knee.  Patient had a MRI and revealed that she had a more significant meniscus tear.  Pt to see Dr Oda Kilts on Friday for a surgical second opinion, but she is hoping to avoid surgery.  Patient has a marathon scheduled for December 2024 and is hoping to be able to run it, but said that she can decrease to a half marathon, if needed.  Patient would benefit from skilled PT to address her functional impairments and allow her to return to her prior functional status to be able to return to her marathon training.  OBJECTIVE IMPAIRMENTS: decreased balance, difficulty walking, decreased strength, increased muscle spasms, impaired flexibility, and pain.   ACTIVITY LIMITATIONS: squatting, stairs, and running  PARTICIPATION LIMITATIONS: community activity and marathon training  PERSONAL FACTORS: Time since onset of injury/illness/exacerbation and 1 comorbidity: OA  are also affecting patient's functional outcome.   REHAB POTENTIAL: Good  CLINICAL DECISION MAKING: Stable/uncomplicated  EVALUATION COMPLEXITY: Low   GOALS: Goals reviewed with patient? Yes  SHORT TERM GOALS: Target date: 01/19/2023 Pt will be independent with initial HEP. Baseline: Goal status: INITIAL  2.  Patient will report at least a 25% improvement in symptoms since initial injury. Baseline:  Goal status: INITIAL   LONG TERM GOALS: Target date: 02/23/2023  Patient will be independent with advanced HEP. Baseline:  Goal status: INITIAL  2.  Pt will increase FOTO to at least 72  to demonstrate improvements in functional status. Baseline:  Goal status: INITIAL  3.  Patient to increase right LE strength to allow her to complete a Forward T with a level/stable pelvis. Baseline:  Goal status: INITIAL  4.  Patient to report being able to return to marathon training without increased pain. Baseline:  Goal status: INITIAL    PLAN:  PT FREQUENCY: 2x/week  PT DURATION: 8 weeks  PLANNED INTERVENTIONS: Therapeutic exercises, Therapeutic activity, Neuromuscular re-education, Balance training, Gait training, Patient/Family education, Self Care, Joint mobilization, Joint manipulation, Stair training, Aquatic Therapy, Dry Needling, Electrical stimulation, Cryotherapy, Moist heat, scar mobilization, Taping, Vasopneumatic device, Ultrasound, Ionotophoresis 4mg /ml Dexamethasone, Manual therapy, and Re-evaluation  PLAN FOR NEXT SESSION: Assess and progress HEP as indicated, strengthening, balance, ambulation   Reather Laurence, PT, DPT 01/02/23, 1:37 PM  [facility] [address] [place], [place] [address] Phone # [phone] Fax [phone]

## 2023-01-05 DIAGNOSIS — M25561 Pain in right knee: Secondary | ICD-10-CM | POA: Diagnosis not present

## 2023-01-05 NOTE — Telephone Encounter (Signed)
Patty [name] regarding her desire for Monovisc.

## 2023-01-08 ENCOUNTER — Ambulatory Visit: Payer: BC Managed Care – PPO | Attending: Sports Medicine

## 2023-01-08 DIAGNOSIS — R2689 Other abnormalities of gait and mobility: Secondary | ICD-10-CM | POA: Diagnosis not present

## 2023-01-08 DIAGNOSIS — M6281 Muscle weakness (generalized): Secondary | ICD-10-CM

## 2023-01-08 DIAGNOSIS — R252 Cramp and spasm: Secondary | ICD-10-CM | POA: Diagnosis present

## 2023-01-08 NOTE — Patient Instructions (Signed)
     [facility] Welcome to [facility]! Here you will find all the information you will need regarding your pool therapy. If you have further questions at any time, please call our office at [phone]. After completing your initial evaluation in the [facility], you may be eligible to complete a portion of your therapy in the pool. A typical week of therapy will consist of 1-2 typical physical therapy visits at our [facility] location and an additional session of therapy in the pool located at the [facility] [place] [facility]. [address] [place] [address]. The phone number at the pool site is [phone]. Please call this number if you are running late or need to cancel your appointment.  Aquatic therapy will be offered on Wednesday mornings and Friday afternoons. Each session will last approximately 45 minutes. All scheduling and payments for aquatic therapy sessions, including cancelations, will be done through our [facility] location.  To be eligible for aquatic therapy, these criteria must be met: You must be able to independently change in the locker room and get to the pool deck. A caregiver can come with you to help if needed. There are benches for a caregiver to sit on next to the pool. No one with an open wound is permitted in the pool.  Handicap parking is available in the front and there is a drop off option for even closer accessibility. Please arrive 15 minutes prior to your appointment to prepare for your pool session. You must sign in at the front desk upon your arrival. Please be sure to attend to any toileting needs prior to entering the pool. Locker rooms for changing are available.  There is direct access to the pool deck from the locker room. You can lock your belongings in a locker or bring them with you poolside. Your therapist will greet you on the pool deck. There may be other swimmers in the pool at the  same time but your session is one-on-one with the therapist.

## 2023-01-08 NOTE — Therapy (Signed)
OUTPATIENT PHYSICAL THERAPY TREATMENT   Patient Name: Patty [name] MRN: [id number] DOB:06-13-1970, 52 y.o., female Today's Date: 01/08/2023  END OF SESSION:  PT End of Session - 01/08/23 0841     Visit Number 2    Date for PT Re-Evaluation 02/23/23    Authorization Type BC/BS    PT Start Time 0801    PT Stop Time 0840    PT Time Calculation (min) 39 min    Activity Tolerance Patient tolerated treatment well    Behavior During Therapy [facility] for tasks assessed/performed              Past Medical History:  Diagnosis Date   Anxiety    History of recurrent UTIs    Hyperlipidemia    Palpitations    Sinus trouble    Past Surgical History:  Procedure Laterality Date   COLONOSCOPY  15 years ago    in [place]-normal   ENDOMETRIAL ABLATION  10/16/2013   SEPTOPLASTY     1998 and 2000   TURBINATE REDUCTION  08/2021   Patient Active Problem List   Diagnosis Date Noted   Acute meniscal tear of right knee 12/14/2022   Allergic rhinitis 11/28/2022   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   Sleep-disordered breathing 07/01/2021   Statin intolerance 05/23/2021   Costochondritis 01/26/2021   Polyarthralgia 01/26/2021   PAC (premature atrial contraction) 08/12/2020   Mixed hyperlipidemia 07/07/2020   Primary osteoarthritis of both knees 08/21/2017   History of tibial stress fracture 08/16/2015    PCP: Ardith Dark, MD  REFERRING PROVIDER: Monica Becton, MD  REFERRING DIAG: S83.206D (ICD-10-CM) - Acute meniscal tear of right knee, subsequent encounter  THERAPY DIAG:  Other abnormalities of gait and mobility  Muscle weakness (generalized)  Cramp and spasm  Rationale for Evaluation and Treatment: Rehabilitation  ONSET DATE: December 11, 2022  SUBJECTIVE:   SUBJECTIVE STATEMENT: I saw Dr Everardo Pacific and he said I don't need surgery if I don't have pain.  My plan is to get another injection and try PRP.  I've been using kinesiotape on the inside of my knee and that  helps.  I have been cycling.    PERTINENT HISTORY: OA with last Monovisc injection November 2023 PAIN:  Are you having pain? Yes: NPRS scale: 0/10 Pain location: right knee (but has history of left knee pain, as well) Pain description: throbbing Aggravating factors: inactivity/prolonged sitting and standing Relieving factors: movement  PRECAUTIONS: None  RED FLAGS: None   WEIGHT BEARING RESTRICTIONS: No  FALLS:  Has patient fallen in last 6 months? 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  Pt has a planned marathon in [place] in December 2024 that she is hoping to be able to complete.  NEXT MD VISIT: Dr Everardo Pacific 01/05/2023, Dr Benjamin Stain once injections approved.  OBJECTIVE:   DIAGNOSTIC FINDINGS:  Right knee MRI on 12/18/2022: IMPRESSION: 1. Large oblique undersurface tear of the posterior horn and body of the medial meniscus, as described. 2. The lateral meniscus, cruciate and collateral ligaments are intact. 3. Mildly progressive tricompartmental degenerative chondrosis with small joint effusion. No acute osseous findings.  PATIENT SURVEYS:  Eval:  FOTO  55 (projected 72 by visit 13)  COGNITION: Overall cognitive status: Within functional limits for tasks assessed     SENSATION: WFL   MUSCLE LENGTH: Hamstrings: right slightly tighter than left LE  POSTURE: No Significant postural limitations   LOWER EXTREMITY ROM:  WFL  LOWER EXTREMITY MMT:  01/02/2023: Right LE strength of 5-/5 grossly throughout Left LE strength is WFL  LOWER EXTREMITY SPECIAL TESTS:  Knee special tests:  Anterior drawer test: negative and Posterior drawer test: negative  FUNCTIONAL TESTS:  01/02/2023: 5 times sit to stand: 8.55 sec Single Leg Stance: 30 seconds on bilateral LE  Forward T assessment:  increased instability noted with RLE  GAIT: Distance walked: 15-20 min Assistive device utilized: None Level of assistance: Complete Independence Comments: Pt reports that when she walks for too long, she starts feel some discomfort on medial aspect of right knee.   TODAY'S TREATMENT:         DATE: 01/08/2023 Seated hamstring stretch 3x20 seconds  Sit to stand: band around thighs with lateral glute activation Sidestepping, forward and reverse monster walk Sidelying clam x10 Seated and supine figure 4                                                                                                                     DATE: 01/02/2023 Reviewed HEP Provided pt with a blue theraband Provided handout for aquatic PT    PATIENT EDUCATION:  Education details: Issued HEP Person educated: Patient Education method: Explanation, Demonstration, and Handouts Education comprehension: verbalized understanding and returned demonstration  HOME EXERCISE PROGRAM: Access Code: [email or web address] URL: [email or web address] Date: 01/02/2023 Prepared by: Clydie Braun Menke  Exercises - Seated Hamstring Stretch  - 1-2 x daily - 7 x weekly - 2 reps - 20 sec hold - Squat with Chair Touch  - 1-2 x daily - 7 x weekly - 2 sets - 10 reps - Side Stepping with Resistance at Ankles  - 1-2 x daily - 7 x weekly - 2 sets - 10 reps - Forward Monster Walks  - 1-2 x daily - 7 x weekly - 2 sets - 10 reps - Backward Monster Walks  - 1-2 x daily - 7 x weekly - 2 sets - 10 reps - Forward T  - 1-2 x daily - 7 x weekly - 2 sets - 10 reps  ASSESSMENT:  CLINICAL IMPRESSION: Pt saw Dr Everardo Pacific and he recommended no surgery unless having pain.  She has not had pain over the past few days.  Pt is doing well with HEP and is  working on pelvic stability.  PT discussed how to slowly return to running by starting with power walking and then walk run intervals.  Pt with improved pelvic stability and required minor cueing for speed and alignment during session.  Patient would benefit from skilled PT to address her functional impairments and allow her to return to her prior functional status to be able to return to her marathon training.  OBJECTIVE IMPAIRMENTS: decreased balance, difficulty  walking, decreased strength, increased muscle spasms, impaired flexibility, and pain.   ACTIVITY LIMITATIONS: squatting, stairs, and running  PARTICIPATION LIMITATIONS: community activity and marathon training  PERSONAL FACTORS: Time since onset of injury/illness/exacerbation and 1 comorbidity: OA  are also affecting patient's functional outcome.   REHAB POTENTIAL: Good  CLINICAL DECISION MAKING: Stable/uncomplicated  EVALUATION COMPLEXITY: Low   GOALS: Goals reviewed with patient? Yes  SHORT TERM GOALS: Target date: 01/19/2023 Pt will be independent with initial HEP. Baseline: Goal status: INITIAL  2.  Patient will report at least a 25% improvement in symptoms since initial injury. Baseline:  Goal status: INITIAL   LONG TERM GOALS: Target date: 02/23/2023  Patient will be independent with advanced HEP. Baseline:  Goal status: INITIAL  2.  Pt will increase FOTO to at least 72 to demonstrate improvements in functional status. Baseline:  Goal status: INITIAL  3.  Patient to increase right LE strength to allow her to complete a Forward T with a level/stable pelvis. Baseline:  Goal status: INITIAL  4.  Patient to report being able to return to marathon training without increased pain. Baseline:  Goal status: INITIAL    PLAN:  PT FREQUENCY: 2x/week  PT DURATION: 8 weeks  PLANNED INTERVENTIONS: Therapeutic exercises, Therapeutic activity, Neuromuscular re-education, Balance training, Gait training,  Patient/Family education, Self Care, Joint mobilization, Joint manipulation, Stair training, Aquatic Therapy, Dry Needling, Electrical stimulation, Cryotherapy, Moist heat, scar mobilization, Taping, Vasopneumatic device, Ultrasound, Ionotophoresis 4mg /ml Dexamethasone, Manual therapy, and Re-evaluation  PLAN FOR NEXT SESSION: pool next.  Work on gluteal strength and pelvic stability.  Gradual return to running.   Lorrene Reid, PT 01/08/23 8:41 AM   [facility] [address], [address] [place], [place] [address] Phone # [phone] Fax [phone]

## 2023-01-11 NOTE — Therapy (Signed)
OUTPATIENT PHYSICAL THERAPY TREATMENT   Patient Name: Patty [name] MRN: [id number] DOB:11/07/1970, 52 y.o., female Today's Date: 01/12/2023  END OF SESSION:  PT End of Session - 01/12/23 1544     Visit Number 3    Date for PT Re-Evaluation 02/23/23    Authorization Type BC/BS    PT Start Time 1345    PT Stop Time 1430    PT Time Calculation (min) 45 min    Activity Tolerance Patient tolerated treatment well    Behavior During Therapy WFL for tasks assessed/performed               Past Medical History:  Diagnosis Date   Anxiety    History of recurrent UTIs    Hyperlipidemia    Palpitations    Sinus trouble    Past Surgical History:  Procedure Laterality Date   COLONOSCOPY  15 years ago    in [place]-normal   ENDOMETRIAL ABLATION  10/16/2013   SEPTOPLASTY     1998 and 2000   TURBINATE REDUCTION  08/2021   Patient Active Problem List   Diagnosis Date Noted   Acute meniscal tear of right knee 12/14/2022   Allergic rhinitis 11/28/2022   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   Sleep-disordered breathing 07/01/2021   Statin intolerance 05/23/2021   Costochondritis 01/26/2021   Polyarthralgia 01/26/2021   PAC (premature atrial contraction) 08/12/2020   Mixed hyperlipidemia 07/07/2020   Primary osteoarthritis of both knees 08/21/2017   History of tibial stress fracture 08/16/2015    PCP: Ardith Dark, MD  REFERRING PROVIDER: Monica Becton, MD  REFERRING DIAG: S83.206D (ICD-10-CM) - Acute meniscal tear of right knee, subsequent encounter  THERAPY DIAG:  Other abnormalities of gait and mobility  Muscle weakness (generalized)  Cramp and spasm  Rationale for Evaluation and Treatment: Rehabilitation  ONSET DATE: December 11, 2022  SUBJECTIVE:   SUBJECTIVE STATEMENT: Not too bad today. I did pretty good with my land exercises last session.   PERTINENT HISTORY: OA with last Monovisc injection November 2023 PAIN:  Are you having pain? Yes: NPRS  scale: 0/10 Pain location: right knee (but has history of left knee pain, as well) Pain description: throbbing Aggravating factors: inactivity/prolonged sitting and standing Relieving factors: movement  PRECAUTIONS: None  RED FLAGS: None   WEIGHT BEARING RESTRICTIONS: No  FALLS:  Has patient fallen in last 6 months? 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  Pt has a planned marathon in [place] in December 2024 that she is hoping to be able to complete.  NEXT MD VISIT: Dr Everardo Pacific 01/05/2023, Dr Benjamin Stain once injections approved.  OBJECTIVE:   DIAGNOSTIC FINDINGS:  Right knee MRI on 12/18/2022: IMPRESSION: 1. Large oblique undersurface tear of the posterior horn and body of the medial meniscus, as described. 2. The lateral meniscus, cruciate and collateral ligaments are intact. 3. Mildly progressive tricompartmental degenerative chondrosis with small joint effusion. No acute osseous findings.  PATIENT SURVEYS:  Eval:  FOTO 55 (projected 72 by visit 13)  COGNITION: Overall cognitive status: Within functional limits for tasks assessed     SENSATION: WFL   MUSCLE LENGTH: Hamstrings: right slightly tighter than  left LE  POSTURE: No Significant postural limitations   LOWER EXTREMITY ROM:  WFL  LOWER EXTREMITY MMT:  01/02/2023: Right LE strength of 5-/5 grossly throughout Left LE strength is WFL  LOWER EXTREMITY SPECIAL TESTS:  Knee special tests: Anterior drawer test: negative and Posterior drawer test: negative  FUNCTIONAL TESTS:  01/02/2023: 5 times sit to stand: 8.55 sec Single Leg  Stance: 30 seconds on bilateral LE  Forward T assessment:  increased instability noted with RLE  GAIT: Distance walked: 15-20 min Assistive device utilized: None Level of assistance: Complete Independence Comments: Pt reports that when she walks for too long, she starts feel some discomfort on medial aspect of right knee.   TODAY'S TREATMENT:   01/12/23: Pt arrives for aquatic physical therapy. Treatment took place in 3.5-5.5 feet of water. Water temperature was 90 degrees F. Pt entered the pool via stairs independently with only light use of the rails. Pt requires buoyancy of water for support and to offload joints with strengthening exercises.  Pt utilizes viscosity of the water required for strengthening. Seated water bench with 75% submersion Pt performed seated LE AROM exercises 20x in all planes, concurrent discussion of water principles and what to expect from todays treatment. 75% depth water water with natural running arms 6x each direction. Hip kicks with ankle fins added Bil 15x no UE required for balance. Bicycle underwater 8 min on horseback mostly LE.          DATE: 01/08/2023 Seated hamstring stretch 3x20 seconds  Sit to stand: band around thighs with lateral glute activation Sidestepping, forward and reverse monster walk Sidelying clam x10 Seated and supine figure 4                                                                                                                     DATE: 01/02/2023 Reviewed HEP Provided pt with a blue theraband Provided handout for aquatic PT    PATIENT EDUCATION:  Education details: Issued HEP Person educated: Patient Education method: Explanation, Demonstration, and Handouts Education comprehension: verbalized understanding and returned demonstration  HOME EXERCISE PROGRAM: Access Code: [email or web address] URL: [email or web address] Date: 01/02/2023 Prepared by: Clydie Braun Menke  Exercises - Seated Hamstring Stretch  - 1-2 x daily  - 7 x weekly - 2 reps - 20 sec hold - Squat with Chair Touch  - 1-2 x daily - 7 x weekly - 2 sets - 10 reps - Side Stepping with Resistance at Ankles  - 1-2 x daily - 7 x weekly - 2 sets - 10 reps - Forward Monster Walks  - 1-2 x daily - 7 x weekly - 2 sets - 10 reps - Backward Monster Walks  - 1-2 x daily - 7 x weekly - 2 sets - 10 reps - Forward T  - 1-2 x daily - 7 x weekly - 2 sets - 10 reps  ASSESSMENT:  CLINICAL IMPRESSION: Pt arrived for first aquatic PT session with no knee pain. Pt was educated on water principles and  how we would use them. Pt had no knee pain with exercises but demonstrated RT hip instabliity in single limb stance.  OBJECTIVE IMPAIRMENTS: decreased balance, difficulty walking, decreased strength, increased muscle spasms, impaired flexibility, and pain.   ACTIVITY LIMITATIONS: squatting, stairs, and running  PARTICIPATION LIMITATIONS: community activity and marathon training  PERSONAL FACTORS: Time since onset of injury/illness/exacerbation and 1 comorbidity: OA  are also affecting patient's functional outcome.   REHAB POTENTIAL: Good  CLINICAL DECISION MAKING: Stable/uncomplicated  EVALUATION COMPLEXITY: Low   GOALS: Goals reviewed with patient? Yes  SHORT TERM GOALS: Target date: 01/19/2023 Pt will be independent with initial HEP. Baseline: Goal status: INITIAL  2.  Patient will report at least a 25% improvement in symptoms since initial injury. Baseline:  Goal status: INITIAL   LONG TERM GOALS: Target date: 02/23/2023  Patient will be independent with advanced HEP. Baseline:  Goal status: INITIAL  2.  Pt will increase FOTO to at least 72 to demonstrate improvements in functional status. Baseline:  Goal status: INITIAL  3.  Patient to increase right LE strength to allow her to complete a Forward T with a level/stable pelvis. Baseline:  Goal status: INITIAL  4.  Patient to report being able to return to marathon training without increased  pain. Baseline:  Goal status: INITIAL    PLAN:  PT FREQUENCY: 2x/week  PT DURATION: 8 weeks  PLANNED INTERVENTIONS: Therapeutic exercises, Therapeutic activity, Neuromuscular re-education, Balance training, Gait training, Patient/Family education, Self Care, Joint mobilization, Joint manipulation, Stair training, Aquatic Therapy, Dry Needling, Electrical stimulation, Cryotherapy, Moist heat, scar mobilization, Taping, Vasopneumatic device, Ultrasound, Ionotophoresis 4mg /ml Dexamethasone, Manual therapy, and Re-evaluation  PLAN FOR NEXT SESSION: See how first aquatic session went.  Ane Payment, PTA 01/12/23 3:45 PM   [facility] [address] [place], [place] [address] Phone # [phone] Fax [phone]

## 2023-01-12 ENCOUNTER — Encounter: Payer: Self-pay | Admitting: Physical Therapy

## 2023-01-12 ENCOUNTER — Ambulatory Visit: Payer: BC Managed Care – PPO | Admitting: Physical Therapy

## 2023-01-12 DIAGNOSIS — M6281 Muscle weakness (generalized): Secondary | ICD-10-CM

## 2023-01-12 DIAGNOSIS — R2689 Other abnormalities of gait and mobility: Secondary | ICD-10-CM

## 2023-01-12 DIAGNOSIS — R252 Cramp and spasm: Secondary | ICD-10-CM

## 2023-01-12 NOTE — Telephone Encounter (Signed)
Patient was notified of copay and will get scheduled next week.

## 2023-01-19 ENCOUNTER — Other Ambulatory Visit (INDEPENDENT_AMBULATORY_CARE_PROVIDER_SITE_OTHER): Payer: BC Managed Care – PPO

## 2023-01-19 ENCOUNTER — Ambulatory Visit: Payer: BC Managed Care – PPO | Admitting: Sports Medicine

## 2023-01-19 ENCOUNTER — Ambulatory Visit: Payer: BC Managed Care – PPO | Admitting: Physical Therapy

## 2023-01-19 DIAGNOSIS — M170 Bilateral primary osteoarthritis of knee: Secondary | ICD-10-CM

## 2023-01-19 MED ORDER — HYLAN G-F 20 48 MG/6ML IX SOSY
6.0000 mL | PREFILLED_SYRINGE | Freq: Once | INTRA_ARTICULAR | Status: AC
Start: 2023-01-19 — End: 2023-01-19
  Administered 2023-01-19: 48 mg via INTRA_ARTICULAR

## 2023-01-19 NOTE — Progress Notes (Signed)
    Procedures performed today:    Procedure: Real-time Ultrasound Guided injection of the right knee Device: Samsung HS60  Verbal informed consent obtained.  Time-out conducted.  Noted no overlying erythema, induration, or other signs of local infection.  Skin prepped in a sterile fashion.  Local anesthesia: Topical Ethyl chloride.  With sterile technique and under real time ultrasound guidance: Only trace effusion noted, using a 22-gauge needle advanced into the suprapatellar recess I injected 1 entire syringe of Synvisc-1.   Completed without difficulty  Advised to call if fevers/chills, erythema, induration, drainage, or persistent bleeding.  Images permanently stored and available for review in PACS.  Impression: Technically successful ultrasound guided injection.  Independent interpretation of notes and tests performed by another provider:   None.  Brief History, Exam, Impression, and Recommendations:    Primary osteoarthritis of both knees Synvisc 1 injection #1 of 1 right knee. Of note she did have some arthritis and meniscal tearing, she saw Dr. Everardo Pacific, no surgery was recommended and patient was not interested in surgery either. If this fails within 6 months we can try PRP again which seem to work well for her in the past.  I would likely do 2-3 weekly PRP sessions.    ____________________________________________ Ihor Austin. Benjamin Stain, M.D., ABFM., CAQSM., AME. Primary Care and Sports Medicine [facility]  Adjunct Professor of Family Medicine  [place] [employer]  Restaurant manager, fast food

## 2023-01-19 NOTE — Assessment & Plan Note (Signed)
Synvisc 1 injection #1 of 1 right knee. Of note she did have some arthritis and meniscal tearing, she saw Dr. Everardo Pacific, no surgery was recommended and patient was not interested in surgery either. If this fails within 6 months we can try PRP again which seem to work well for her in the past.  I would likely do 2-3 weekly PRP sessions.

## 2023-01-22 ENCOUNTER — Ambulatory Visit (HOSPITAL_BASED_OUTPATIENT_CLINIC_OR_DEPARTMENT_OTHER): Payer: BC Managed Care – PPO | Admitting: Physical Therapy

## 2023-01-24 ENCOUNTER — Ambulatory Visit: Payer: BC Managed Care – PPO

## 2023-01-25 ENCOUNTER — Encounter: Payer: Self-pay | Admitting: Rehabilitative and Restorative Service Providers"

## 2023-01-25 ENCOUNTER — Ambulatory Visit: Payer: BC Managed Care – PPO | Admitting: Rehabilitative and Restorative Service Providers"

## 2023-01-25 DIAGNOSIS — R252 Cramp and spasm: Secondary | ICD-10-CM

## 2023-01-25 DIAGNOSIS — R2689 Other abnormalities of gait and mobility: Secondary | ICD-10-CM

## 2023-01-25 DIAGNOSIS — M6281 Muscle weakness (generalized): Secondary | ICD-10-CM

## 2023-01-25 NOTE — Therapy (Signed)
OUTPATIENT PHYSICAL THERAPY TREATMENT NOTE   Patient Name: Patty [name] MRN: [id number] DOB:[birth year], 52 y.o., female Today's Date: 01/25/2023  END OF SESSION:  PT End of Session - 01/25/23 1152     Visit Number 4    Date for PT Re-Evaluation 02/23/23    Authorization Type BC/BS    PT Start Time 1145    PT Stop Time 1225    PT Time Calculation (min) 40 min    Activity Tolerance Patient tolerated treatment well    Behavior During Therapy WFL for tasks assessed/performed               Past Medical History:  Diagnosis Date   Anxiety    History of recurrent UTIs    Hyperlipidemia    Palpitations    Sinus trouble    Past Surgical History:  Procedure Laterality Date   COLONOSCOPY  15 years ago    in [place]-normal   ENDOMETRIAL ABLATION  10/16/2013   SEPTOPLASTY     1998 and 2000   TURBINATE REDUCTION  08/2021   Patient Active Problem List   Diagnosis Date Noted   Acute meniscal tear of right knee 12/14/2022   Allergic rhinitis 11/28/2022   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   Sleep-disordered breathing 07/01/2021   Statin intolerance 05/23/2021   Costochondritis 01/26/2021   Polyarthralgia 01/26/2021   PAC (premature atrial contraction) 08/12/2020   Mixed hyperlipidemia 07/07/2020   Primary osteoarthritis of both knees 08/21/2017   History of tibial stress fracture 08/16/2015    PCP: Ardith Dark, MD  REFERRING PROVIDER: Monica Becton, MD  REFERRING DIAG: S83.206D (ICD-10-CM) - Acute meniscal tear of right knee, subsequent encounter  THERAPY DIAG:  Other abnormalities of gait and mobility  Muscle weakness (generalized)  Cramp and spasm  Rationale for Evaluation and Treatment: Rehabilitation  ONSET DATE: December 11, 2022  SUBJECTIVE:   SUBJECTIVE STATEMENT:  Pt reports that she had her monovisc injection on 8/16 and that Dr T wants to hold off on PRP injection at this time.  PERTINENT HISTORY: OA with last Monovisc injection  November 2023 PAIN:  Are you having pain? Yes: NPRS scale: 0/10 Pain location: right knee (but has history of left knee pain, as well) Pain description: throbbing Aggravating factors: inactivity/prolonged sitting and standing Relieving factors: movement  PRECAUTIONS: None  RED FLAGS: None   WEIGHT BEARING RESTRICTIONS: No  FALLS:  Has patient fallen in last 6 months? 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  Pt has a planned marathon in [place] in December 2024 that she is hoping to be able to complete.  NEXT MD VISIT: Dr Everardo Pacific 01/05/2023, Dr Benjamin Stain once injections approved.  OBJECTIVE:   DIAGNOSTIC FINDINGS:  Right knee MRI on 12/18/2022: IMPRESSION: 1. Large oblique undersurface tear of the posterior horn and body of the medial meniscus, as described. 2. The lateral meniscus, cruciate and collateral ligaments are intact. 3. Mildly progressive tricompartmental degenerative chondrosis with small joint effusion. No acute osseous findings.  PATIENT SURVEYS:  Eval:  FOTO 55 (projected 72 by visit 13)  COGNITION: Overall cognitive status: Within functional limits for tasks assessed  SENSATION: WFL   MUSCLE LENGTH: Hamstrings: right slightly tighter than left LE  POSTURE: No Significant postural limitations   LOWER EXTREMITY ROM:  WFL  LOWER EXTREMITY MMT:  01/02/2023: Right LE strength of 5-/5 grossly throughout Left LE strength is WFL  LOWER EXTREMITY SPECIAL TESTS:  Knee special tests: Anterior drawer test: negative and Posterior drawer test: negative  FUNCTIONAL TESTS:   01/02/2023: 5 times sit to stand: 8.55 sec Single Leg Stance: 30 seconds on bilateral LE  Forward T assessment:  increased instability noted with RLE  GAIT: Distance walked: 15-20 min Assistive device utilized: None Level of assistance: Complete Independence Comments: Pt reports that when she walks for too long, she starts feel some discomfort on medial aspect of right knee.   TODAY'S TREATMENT:  DATE: 01/25/2023: Nustep level 5 x6 min with PT present to discuss status Seated hamstring stretch 3x20 seconds  Sit to stand: yellow loop around thighs with lateral glute activation 2x10 FWD and backwards monster walks with black loop 4x10 ft each Lateral band walking with black loop 4x10 ft bilat Supine bridge with hip abduction with black loop around thighs 2x10 Side-lying reverse clamshell with black loop 2x10 bilat Side step lunge/curtsey 2x10 bilat Seated piriformis stretch 2x20 sec bilat Single leg stance on flat side of half foam roll 2x20 sec (with UE support of counter, as needed) Single leg stance on bubble side of bosu x10 sec bilat   01/12/23: Pt arrives for aquatic physical therapy. Treatment took place in 3.5-5.5 feet of water. Water temperature was 90 degrees F. Pt entered the pool via stairs independently with only light use of the rails. Pt requires buoyancy of water for support and to offload joints with strengthening exercises.  Pt utilizes viscosity of the water required for strengthening. Seated water bench with 75% submersion Pt performed seated LE AROM exercises 20x in all planes, concurrent discussion of water principles and what to expect from todays treatment. 75% depth water water with natural running arms 6x each direction. Hip kicks with ankle fins added Bil 15x no UE required for balance. Bicycle underwater 8 min on horseback mostly LE.          DATE: 01/08/2023 Seated hamstring stretch 3x20 seconds  Sit to stand: band around thighs with lateral glute  activation Sidestepping, forward and reverse monster walk Sidelying clam x10 Seated and supine figure 4                                                                                                                       PATIENT EDUCATION:  Education details: Issued HEP Person educated: Patient Education method: Explanation, Demonstration, and Handouts Education comprehension: verbalized understanding and returned demonstration  HOME EXERCISE PROGRAM: Access Code: [email or web address] URL: [email or web address] Date: 01/25/2023 Prepared by: Reather Laurence  Exercises - Bridge with Hip Abduction and Resistance - Ground Touches  - 1 x daily - 7 x weekly - 2 sets - 10 reps - Sidelying Reverse Clamshell with Resistance  - 1  x daily - 7 x weekly - 2 sets - 10 reps - Seated Hamstring Stretch  - 1-2 x daily - 7 x weekly - 2 reps - 20 sec hold - Seated Piriformis Stretch with Trunk Bend  - 1 x daily - 7 x weekly - 2 reps - 20 sec hold - Squat with Chair Touch  - 1-2 x daily - 7 x weekly - 2 sets - 10 reps - Side Stepping with Resistance at Ankles  - 1-2 x daily - 7 x weekly - 2 sets - 10 reps - Forward Monster Walks  - 1-2 x daily - 7 x weekly - 2 sets - 10 reps - Backward Monster Walks  - 1-2 x daily - 7 x weekly - 2 sets - 10 reps - Forward T  - 1-2 x daily - 7 x weekly - 2 sets - 10 reps - Lateral Single Leg Lunge Jumps  - 1 x daily - 7 x weekly - 2 sets - 10 reps  ASSESSMENT:  CLINICAL IMPRESSION:  Patty [name] arrives to PT appointment after a delay secondary to receiving an injection in her right knee last week.  She states that her knee is feeling much better and she has been cleared to start exercising again.  Patient able to progress with session with improved strengthening and provided with black theraband for home use.  Patient able to progress with single leg stance in more challenging environments to allow her to improve her muscle stability and coordination with balancing strategies.   Patient to have aquatic PT session tomorrow, as she had a great benefit after last aquatic session.  OBJECTIVE IMPAIRMENTS: decreased balance, difficulty walking, decreased strength, increased muscle spasms, impaired flexibility, and pain.   ACTIVITY LIMITATIONS: squatting, stairs, and running  PARTICIPATION LIMITATIONS: community activity and marathon training  PERSONAL FACTORS: Time since onset of injury/illness/exacerbation and 1 comorbidity: OA  are also affecting patient's functional outcome.   REHAB POTENTIAL: Good  CLINICAL DECISION MAKING: Stable/uncomplicated  EVALUATION COMPLEXITY: Low   GOALS: Goals reviewed with patient? Yes  SHORT TERM GOALS: Target date: 01/19/2023 Pt will be independent with initial HEP. Baseline: Goal status: MET  2.  Patient will report at least a 25% improvement in symptoms since initial injury. Baseline:  Goal status: MET on 01/25/23   LONG TERM GOALS: Target date: 02/23/2023  Patient will be independent with advanced HEP. Baseline:  Goal status: Ongoing  2.  Pt will increase FOTO to at least 72 to demonstrate improvements in functional status. Baseline:  Goal status: INITIAL  3.  Patient to increase right LE strength to allow her to complete a Forward T with a level/stable pelvis. Baseline:  Goal status: Ongoing  4.  Patient to report being able to return to marathon training without increased pain. Baseline:  Goal status: INITIAL    PLAN:  PT FREQUENCY: 2x/week  PT DURATION: 8 weeks  PLANNED INTERVENTIONS: Therapeutic exercises, Therapeutic activity, Neuromuscular re-education, Balance training, Gait training, Patient/Family education, Self Care, Joint mobilization, Joint manipulation, Stair training, Aquatic Therapy, Dry Needling, Electrical stimulation, Cryotherapy, Moist heat, scar mobilization, Taping, Vasopneumatic device, Ultrasound, Ionotophoresis 4mg /ml Dexamethasone, Manual therapy, and Re-evaluation  PLAN FOR  NEXT SESSION: Aquatic PT, strengthening, hip stability, flexibility   Reather Laurence, PT, DPT 01/25/23, 11:53 AM  [facility] [address] [place], [place] [address] Phone # [phone] Fax [phone]

## 2023-01-26 ENCOUNTER — Encounter: Payer: Self-pay | Admitting: Physical Therapy

## 2023-01-26 ENCOUNTER — Ambulatory Visit: Payer: BC Managed Care – PPO | Admitting: Physical Therapy

## 2023-01-26 DIAGNOSIS — R2689 Other abnormalities of gait and mobility: Secondary | ICD-10-CM | POA: Diagnosis not present

## 2023-01-26 DIAGNOSIS — M6281 Muscle weakness (generalized): Secondary | ICD-10-CM

## 2023-01-26 DIAGNOSIS — R252 Cramp and spasm: Secondary | ICD-10-CM

## 2023-01-26 NOTE — Therapy (Signed)
OUTPATIENT PHYSICAL THERAPY TREATMENT NOTE   Patient Name: NGAN HEFLIN MRN: [id number] DOB:1970-10-17, 52 y.o., female Today's Date: 01/26/2023  END OF SESSION:  PT End of Session - 01/26/23 1601     Visit Number 5    Date for PT Re-Evaluation 02/23/23    Authorization Type BC/BS    PT Start Time 1345    PT Stop Time 1430    PT Time Calculation (min) 45 min    Activity Tolerance Patient tolerated treatment well    Behavior During Therapy WFL for tasks assessed/performed                Past Medical History:  Diagnosis Date   Anxiety    History of recurrent UTIs    Hyperlipidemia    Palpitations    Sinus trouble    Past Surgical History:  Procedure Laterality Date   COLONOSCOPY  15 years ago    in [place]-normal   ENDOMETRIAL ABLATION  10/16/2013   SEPTOPLASTY     1998 and 2000   TURBINATE REDUCTION  08/2021   Patient Active Problem List   Diagnosis Date Noted   Acute meniscal tear of right knee 12/14/2022   Allergic rhinitis 11/28/2022   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   Sleep-disordered breathing 07/01/2021   Statin intolerance 05/23/2021   Costochondritis 01/26/2021   Polyarthralgia 01/26/2021   PAC (premature atrial contraction) 08/12/2020   Mixed hyperlipidemia 07/07/2020   Primary osteoarthritis of both knees 08/21/2017   History of tibial stress fracture 08/16/2015    PCP: Ardith Dark, MD  REFERRING PROVIDER: Monica Becton, MD  REFERRING DIAG: S83.206D (ICD-10-CM) - Acute meniscal tear of right knee, subsequent encounter  THERAPY DIAG:  Other abnormalities of gait and mobility  Muscle weakness (generalized)  Cramp and spasm  Rationale for Evaluation and Treatment: Rehabilitation  ONSET DATE: December 11, 2022  SUBJECTIVE:   SUBJECTIVE STATEMENT: Doing well, plans to cycle this weekend.   PERTINENT HISTORY: OA with last Monovisc injection November 2023 PAIN:  Are you having pain? Yes: NPRS scale: 0/10 Pain  location: right knee (but has history of left knee pain, as well) Pain description: throbbing Aggravating factors: inactivity/prolonged sitting and standing Relieving factors: movement  PRECAUTIONS: None  RED FLAGS: None   WEIGHT BEARING RESTRICTIONS: No  FALLS:  Has patient fallen in last 6 months? 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  Pt has a planned marathon in [place] in December 2024 that she is hoping to be able to complete.  NEXT MD VISIT: Dr Everardo Pacific 01/05/2023, Dr Benjamin Stain once injections approved.  OBJECTIVE:   DIAGNOSTIC FINDINGS:  Right knee MRI on 12/18/2022: IMPRESSION: 1. Large oblique undersurface tear of the posterior horn and body of the medial meniscus, as described. 2. The lateral meniscus, cruciate and collateral ligaments are intact. 3. Mildly progressive tricompartmental degenerative chondrosis with small joint effusion. No acute osseous findings.  PATIENT SURVEYS:  Eval:  FOTO 55 (projected 72 by visit 13)  COGNITION: Overall cognitive status: Within functional limits for tasks assessed     SENSATION: WFL   MUSCLE LENGTH: Hamstrings: right slightly tighter than left LE  POSTURE: No  Significant postural limitations   LOWER EXTREMITY ROM:  WFL  LOWER EXTREMITY MMT:  01/02/2023: Right LE strength of 5-/5 grossly throughout Left LE strength is WFL  LOWER EXTREMITY SPECIAL TESTS:  Knee special tests: Anterior drawer test: negative and Posterior drawer test: negative  FUNCTIONAL TESTS:  01/02/2023: 5 times sit to stand: 8.55 sec Single Leg Stance: 30  seconds on bilateral LE  Forward T assessment:  increased instability noted with RLE  GAIT: Distance walked: 15-20 min Assistive device utilized: None Level of assistance: Complete Independence Comments: Pt reports that when she walks for too long, she starts feel some discomfort on medial aspect of right knee.   TODAY'S TREATMENT:  DATE:  01/26/23:Pt arrives for aquatic physical therapy. Treatment took place in 3.5-5.5 feet of water. Water temperature was 91 degrees F. Pt entered the pool via stairs independently with only light use of the rails. Pt requires buoyancy of water for support and to offload joints with strengthening exercises.  Pt utilizes viscosity of the water required for strengthening. Seated water bench with 75% submersion Pt performed seated LE AROM exercises 20x in all planes, added ankle fins to LAQ,  concurrent discussion of current status. 75% depth water water with natural running arms 8x each direction. Hip kicks with ankle fins added Bil 15x no UE required for balance. Front plank bicycle 1 min 3x VC for more hip extension. Bicycle underwater 5 min on horseback mostly LE.          01/25/2023: Nustep level 5 x6 min with PT present to discuss status Seated hamstring stretch 3x20 seconds  Sit to stand: yellow loop around thighs with lateral glute activation 2x10 FWD and backwards monster walks with black loop 4x10 ft each Lateral band walking with black loop 4x10 ft bilat Supine bridge with hip abduction with black loop around thighs 2x10 Side-lying reverse clamshell with black loop 2x10 bilat Side step lunge/curtsey 2x10 bilat Seated piriformis stretch 2x20 sec bilat Single leg stance on flat side of half foam roll 2x20 sec (with UE support of counter, as needed) Single leg stance on bubble side of bosu x10 sec bilat   PATIENT EDUCATION:  Education details: Issued HEP Person educated: Patient Education method: Explanation, Demonstration, and Handouts Education  comprehension: verbalized understanding and returned demonstration  HOME EXERCISE PROGRAM: Access Code: [email or web address] URL: [email or web address] Date: 01/25/2023 Prepared by: Reather Laurence  Exercises - Bridge with Hip Abduction and Resistance - Ground Touches  - 1 x daily - 7 x weekly - 2 sets - 10 reps - Sidelying Reverse Clamshell with Resistance  - 1 x daily - 7 x weekly - 2 sets - 10 reps - Seated Hamstring Stretch  - 1-2 x daily - 7 x weekly - 2 reps - 20 sec hold - Seated Piriformis Stretch with Trunk Bend  - 1 x daily - 7 x weekly - 2 reps - 20 sec hold - Squat with Chair Touch  - 1-2 x daily - 7 x weekly - 2 sets - 10 reps - Side Stepping with Resistance at Ankles  - 1-2 x daily - 7 x weekly - 2 sets - 10 reps - Forward Monster Walks  - 1-2 x daily - 7 x weekly - 2 sets - 10 reps - Backward Monster Walks  - 1-2 x daily - 7 x weekly - 2 sets - 10 reps - Forward T  - 1-2 x daily - 7 x weekly - 2 sets - 10 reps - Lateral Single  Leg Lunge Jumps  - 1 x daily - 7 x weekly - 2 sets - 10 reps  ASSESSMENT:  CLINICAL IMPRESSION: Pt arrives for follow up aquatic treatment. Presents essentially pain free but does feel some discomfort. She tolerates all exercises well, no pain or other symptomology. Pt is eager to begin running.  OBJECTIVE IMPAIRMENTS: decreased balance, difficulty walking, decreased strength, increased muscle spasms, impaired flexibility, and pain.   ACTIVITY LIMITATIONS: squatting, stairs, and running  PARTICIPATION LIMITATIONS: community activity and marathon training  PERSONAL FACTORS: Time since onset of injury/illness/exacerbation and 1 comorbidity: OA  are also affecting patient's functional outcome.   REHAB POTENTIAL: Good  CLINICAL DECISION MAKING: Stable/uncomplicated  EVALUATION COMPLEXITY: Low   GOALS: Goals reviewed with patient? Yes  SHORT TERM GOALS: Target date: 01/19/2023 Pt will be independent with initial HEP. Baseline: Goal status:  MET  2.  Patient will report at least a 25% improvement in symptoms since initial injury. Baseline:  Goal status: MET on 01/25/23   LONG TERM GOALS: Target date: 02/23/2023  Patient will be independent with advanced HEP. Baseline:  Goal status: Ongoing  2.  Pt will increase FOTO to at least 72 to demonstrate improvements in functional status. Baseline:  Goal status: INITIAL  3.  Patient to increase right LE strength to allow her to complete a Forward T with a level/stable pelvis. Baseline:  Goal status: Ongoing  4.  Patient to report being able to return to marathon training without increased pain. Baseline:  Goal status: INITIAL    PLAN:  PT FREQUENCY: 2x/week  PT DURATION: 8 weeks  PLANNED INTERVENTIONS: Therapeutic exercises, Therapeutic activity, Neuromuscular re-education, Balance training, Gait training, Patient/Family education, Self Care, Joint mobilization, Joint manipulation, Stair training, Aquatic Therapy, Dry Needling, Electrical stimulation, Cryotherapy, Moist heat, scar mobilization, Taping, Vasopneumatic device, Ultrasound, Ionotophoresis 4mg /ml Dexamethasone, Manual therapy, and Re-evaluation  PLAN FOR NEXT SESSION: Aquatic PT begin water jogging, strengthening, hip stability, flexibility   Ane Payment, PTA 01/26/23 5:06 PM   [facility] [address] [place], [place] [address] Phone # [phone] Fax [phone]

## 2023-01-30 ENCOUNTER — Ambulatory Visit: Payer: BC Managed Care – PPO | Admitting: Rehabilitative and Restorative Service Providers"

## 2023-01-30 ENCOUNTER — Encounter: Payer: Self-pay | Admitting: Rehabilitative and Restorative Service Providers"

## 2023-01-30 DIAGNOSIS — M6281 Muscle weakness (generalized): Secondary | ICD-10-CM | POA: Diagnosis not present

## 2023-01-30 DIAGNOSIS — R2689 Other abnormalities of gait and mobility: Secondary | ICD-10-CM

## 2023-01-30 DIAGNOSIS — R252 Cramp and spasm: Secondary | ICD-10-CM | POA: Diagnosis not present

## 2023-01-30 NOTE — Therapy (Signed)
OUTPATIENT PHYSICAL THERAPY TREATMENT NOTE   Patient Name: Patty [name] MRN: [id number] DOB:02/25/1971, 52 y.o., female Today's Date: 01/30/2023  END OF SESSION:  PT End of Session - 01/30/23 0933     Visit Number 6    Date for PT Re-Evaluation 02/23/23    Authorization Type BC/BS    PT Start Time 0930    PT Stop Time 1010    PT Time Calculation (min) 40 min    Activity Tolerance Patient tolerated treatment well    Behavior During Therapy [facility] for tasks assessed/performed                Past Medical History:  Diagnosis Date   Anxiety    History of recurrent UTIs    Hyperlipidemia    Palpitations    Sinus trouble    Past Surgical History:  Procedure Laterality Date   COLONOSCOPY  15 years ago    in [place]-normal   ENDOMETRIAL ABLATION  10/16/2013   SEPTOPLASTY     1998 and 2000   TURBINATE REDUCTION  08/2021   Patient Active Problem List   Diagnosis Date Noted   Acute meniscal tear of right knee 12/14/2022   Allergic rhinitis 11/28/2022   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   Sleep-disordered breathing 07/01/2021   Statin intolerance 05/23/2021   Costochondritis 01/26/2021   Polyarthralgia 01/26/2021   PAC (premature atrial contraction) 08/12/2020   Mixed hyperlipidemia 07/07/2020   Primary osteoarthritis of both knees 08/21/2017   History of tibial stress fracture 08/16/2015    PCP: Ardith Dark, MD  REFERRING PROVIDER: Monica Becton, MD  REFERRING DIAG: S83.206D (ICD-10-CM) - Acute meniscal tear of right knee, subsequent encounter  THERAPY DIAG:  Other abnormalities of gait and mobility  Muscle weakness (generalized)  Cramp and spasm  Rationale for Evaluation and Treatment: Rehabilitation  ONSET DATE: December 11, 2022  SUBJECTIVE:   SUBJECTIVE STATEMENT:  Patient reports that she has not started trying to run again.  States that she did play pickleball yesterday, so she is having some additional discomfort.  Reports that the  pool exercises are going well.   PERTINENT HISTORY: OA with last Monovisc injection November 2023 PAIN:  Are you having pain? Yes: NPRS scale: 3-4/10 Pain location: right knee (but has history of left knee pain, as well) Pain description: throbbing Aggravating factors: inactivity/prolonged sitting and standing Relieving factors: movement  PRECAUTIONS: None  RED FLAGS: None   WEIGHT BEARING RESTRICTIONS: No  FALLS:  Has patient fallen in last 6 months? 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  Pt has a planned marathon in [place] in December 2024 that she is hoping to be able to complete.  NEXT MD VISIT: Dr Everardo Pacific 01/05/2023, Dr Benjamin Stain once injections approved.  OBJECTIVE:   DIAGNOSTIC FINDINGS:  Right knee MRI on 12/18/2022: IMPRESSION: 1. Large oblique undersurface tear of the posterior horn and body of the medial meniscus, as described. 2. The lateral meniscus, cruciate and collateral ligaments are intact. 3. Mildly progressive tricompartmental degenerative chondrosis with small joint effusion. No acute osseous findings.  PATIENT SURVEYS:  Eval:  FOTO 55 (projected 72 by visit 13)  COGNITION:  Overall cognitive status: Within functional limits for tasks assessed     SENSATION: WFL   MUSCLE LENGTH: Hamstrings: right slightly tighter than left LE  POSTURE: No Significant postural limitations   LOWER EXTREMITY ROM:  WFL  LOWER EXTREMITY MMT:  01/02/2023: Right LE strength of 5-/5 grossly throughout Left LE strength is WFL  LOWER EXTREMITY SPECIAL TESTS:  Knee special tests:  Anterior drawer test: negative and Posterior drawer test: negative  FUNCTIONAL TESTS:  01/02/2023: 5 times sit to stand: 8.55 sec Single Leg Stance: 30 seconds on bilateral LE  Forward T assessment:  increased instability noted with RLE  GAIT: Distance walked: 15-20 min Assistive device utilized: None Level of assistance: Complete Independence Comments: Pt reports that when she walks for too long, she starts feel some discomfort on medial aspect of right knee.   TODAY'S TREATMENT:  DATE: 01/30/2023: Elliptical level 1.0 x5 min FWD and x1 min backwards with PT present to discuss status Seated hamstring stretch 2x20 seconds  Seated piriformis stretch 2x20 sec bilat Side-lying reverse clamshell with black loop 2x10 bilat Side-lying clamshell with black loop 2x10 bilat Supine bridge with hip abduction with black loop around thighs 2x10 Side step lunge/curtsey 2x10 bilat Lateral and backwards lunge with foot on slider x10 each bilat (with cuing to only lunge as far as is comfortable on right knee) Leg Press (seat at 6) 90# 2x10 Single Leg Press (seat at 6) 40# x10 bilat Wall squat x10 Wall squat with hold 5x5 sec   01/26/23:Pt arrives for aquatic physical therapy. Treatment took place in 3.5-5.5 feet of water. Water temperature was 91 degrees F. Pt entered the pool via stairs independently with only light use of the rails. Pt requires buoyancy of water for support and to offload joints with strengthening exercises.  Pt utilizes viscosity of the water required for strengthening. Seated water bench with 75% submersion Pt performed seated LE AROM exercises 20x in all planes, added ankle fins to LAQ,  concurrent discussion of current status. 75% depth water water with natural running arms 8x each direction. Hip kicks with ankle fins added Bil 15x no UE required for balance. Front plank bicycle 1 min 3x VC for more hip extension. Bicycle underwater 5 min on horseback mostly LE.           01/25/2023: Nustep level 5 x6 min with PT present to discuss status Seated hamstring stretch 3x20 seconds  Sit to stand: yellow loop around thighs with lateral glute activation 2x10 FWD and backwards monster walks with black loop 4x10 ft each Lateral band walking with black loop 4x10 ft bilat Supine bridge with hip abduction with black loop around thighs 2x10 Side-lying reverse clamshell with black loop 2x10 bilat Side step lunge/curtsey 2x10 bilat Seated piriformis stretch 2x20 sec bilat Single leg stance on flat side of half foam roll 2x20 sec (with UE support of counter, as needed) Single leg stance on bubble side of bosu x10 sec bilat   PATIENT EDUCATION:  Education details: Issued HEP Person educated: Patient Education method: Explanation, Demonstration, and Handouts Education comprehension: verbalized understanding and returned demonstration  HOME EXERCISE PROGRAM: Access Code: [email or web address] URL: [email or web address] Date: 01/30/2023 Prepared by: Reather Laurence  Exercises - Bridge with Hip Abduction and Resistance - Ground Touches  - 1 x daily - 7 x weekly - 2 sets - 10 reps - Sidelying Reverse Clamshell with Resistance  - 1 x daily - 7 x weekly - 2 sets - 10 reps - Seated Hamstring Stretch  -  1-2 x daily - 7 x weekly - 2 reps - 20 sec hold - Seated Piriformis Stretch with Trunk Bend  - 1 x daily - 7 x weekly - 2 reps - 20 sec hold - Squat with Chair Touch  - 1-2 x daily - 7 x weekly - 2 sets - 10 reps - Side Stepping with Resistance at Ankles  - 1-2 x daily - 7 x weekly - 2 sets - 10 reps - Forward Monster Walks  - 1-2 x daily - 7 x weekly - 2 sets - 10 reps - Backward Monster Walks  - 1-2 x daily - 7 x weekly - 2 sets - 10 reps - Forward T  - 1-2 x daily - 7 x weekly - 2 sets - 10 reps - Lateral Single Leg Lunge Jumps  - 1 x daily - 7 x weekly - 2 sets - 10 reps - Wall Slide with Posterior Pelvic Tilt  - 1 x daily - 7 x weekly - 1-2 sets - 10 reps - Wall Squat   - 1 x daily - 7 x weekly - 5 reps - 5 sec hold  ASSESSMENT:  CLINICAL IMPRESSION:  Lun[place]ie presents to skilled PT reporting that she is feeling stronger, but has not yet tried to resume running.  Patient was able to initiate exercise on the elliptical this morning.  Patient with good quad control with leg press and added wall squats to HEP to simulate weight machine at home.  Patient educated to attempt an easy jog at home to assess how her knee tolerates motion/activity.   OBJECTIVE IMPAIRMENTS: decreased balance, difficulty walking, decreased strength, increased muscle spasms, impaired flexibility, and pain.   ACTIVITY LIMITATIONS: squatting, stairs, and running  PARTICIPATION LIMITATIONS: community activity and marathon training  PERSONAL FACTORS: Time since onset of injury/illness/exacerbation and 1 comorbidity: OA  are also affecting patient's functional outcome.   REHAB POTENTIAL: Good  CLINICAL DECISION MAKING: Stable/uncomplicated  EVALUATION COMPLEXITY: Low   GOALS: Goals reviewed with patient? Yes  SHORT TERM GOALS: Target date: 01/19/2023 Pt will be independent with initial HEP. Baseline: Goal status: MET  2.  Patient will report at least a 25% improvement in symptoms since initial injury. Baseline:  Goal status: MET on 01/25/23   LONG TERM GOALS: Target date: 02/23/2023  Patient will be independent with advanced HEP. Baseline:  Goal status: Ongoing  2.  Pt will increase FOTO to at least 72 to demonstrate improvements in functional status. Baseline:  Goal status: INITIAL  3.  Patient to increase right LE strength to allow her to complete a Forward T with a level/stable pelvis. Baseline:  Goal status: Ongoing  4.  Patient to report being able to return to marathon training without increased pain. Baseline:  Goal status: INITIAL    PLAN:  PT FREQUENCY: 2x/week  PT DURATION: 8 weeks  PLANNED INTERVENTIONS: Therapeutic exercises, Therapeutic activity,  Neuromuscular re-education, Balance training, Gait training, Patient/Family education, Self Care, Joint mobilization, Joint manipulation, Stair training, Aquatic Therapy, Dry Needling, Electrical stimulation, Cryotherapy, Moist heat, scar mobilization, Taping, Vasopneumatic device, Ultrasound, Ionotophoresis 4mg /ml Dexamethasone, Manual therapy, and Re-evaluation  PLAN FOR NEXT SESSION: Aquatic PT begin water jogging, strengthening, hip stability, flexibility   Reather Laurence, PT, DPT 01/30/23, 11:24 AM   [facility] [address] [place], [place] [address] Phone # [phone] Fax [phone]

## 2023-02-02 ENCOUNTER — Encounter: Payer: Self-pay | Admitting: Physical Therapy

## 2023-02-02 ENCOUNTER — Ambulatory Visit: Payer: BC Managed Care – PPO | Admitting: Physical Therapy

## 2023-02-02 DIAGNOSIS — M6281 Muscle weakness (generalized): Secondary | ICD-10-CM

## 2023-02-02 DIAGNOSIS — R2689 Other abnormalities of gait and mobility: Secondary | ICD-10-CM

## 2023-02-02 DIAGNOSIS — R252 Cramp and spasm: Secondary | ICD-10-CM

## 2023-02-02 NOTE — Therapy (Signed)
OUTPATIENT PHYSICAL THERAPY TREATMENT NOTE   Patient Name: Patty [name] MRN: [id number] DOB:01/23/1971, 52 y.o., female Today's Date: 02/02/2023  END OF SESSION:  PT End of Session - 02/02/23 1748     Visit Number 7    Date for PT Re-Evaluation 02/23/23    Authorization Type BC/BS    PT Start Time 1515    PT Stop Time 1556    PT Time Calculation (min) 41 min    Activity Tolerance Patient tolerated treatment well                 Past Medical History:  Diagnosis Date   Anxiety    History of recurrent UTIs    Hyperlipidemia    Palpitations    Sinus trouble    Past Surgical History:  Procedure Laterality Date   COLONOSCOPY  15 years ago    in [place]-normal   ENDOMETRIAL ABLATION  10/16/2013   SEPTOPLASTY     1998 and 2000   TURBINATE REDUCTION  08/2021   Patient Active Problem List   Diagnosis Date Noted   Acute meniscal tear of right knee 12/14/2022   Allergic rhinitis 11/28/2022   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   Sleep-disordered breathing 07/01/2021   Statin intolerance 05/23/2021   Costochondritis 01/26/2021   Polyarthralgia 01/26/2021   PAC (premature atrial contraction) 08/12/2020   Mixed hyperlipidemia 07/07/2020   Primary osteoarthritis of both knees 08/21/2017   History of tibial stress fracture 08/16/2015    PCP: Ardith Dark, MD  REFERRING PROVIDER: Monica Becton, MD  REFERRING DIAG: S83.206D (ICD-10-CM) - Acute meniscal tear of right knee, subsequent encounter  THERAPY DIAG:  Other abnormalities of gait and mobility  Muscle weakness (generalized)  Cramp and spasm  Rationale for Evaluation and Treatment: Rehabilitation  ONSET DATE: December 11, 2022  SUBJECTIVE:   SUBJECTIVE STATEMENT: I really liked my last land appt but my Rt knee did not like it. A lot of knee pain for 2 days after. I think it was the slider exercise that made me lunge was maybe too much.     PERTINENT HISTORY: OA with last Monovisc injection  November 2023 PAIN:  Are you having pain? Yes: NPRS scale: 5-6/10 Pain location: right knee (but has history of left knee pain, as well) Pain description: throbbing Aggravating factors: inactivity/prolonged sitting and standing Relieving factors: movement  PRECAUTIONS: None  RED FLAGS: None   WEIGHT BEARING RESTRICTIONS: No  FALLS:  Has patient fallen in last 6 months? 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  Pt has a planned marathon in [place] in December 2024 that she is hoping to be able to complete.  NEXT MD VISIT: Dr Everardo Pacific 01/05/2023, Dr Benjamin Stain once injections approved.  OBJECTIVE:   DIAGNOSTIC FINDINGS:  Right knee MRI on 12/18/2022: IMPRESSION: 1. Large oblique undersurface tear of the posterior horn and body of the medial meniscus, as described. 2. The lateral meniscus, cruciate and collateral ligaments are intact. 3. Mildly progressive tricompartmental degenerative chondrosis with small joint effusion. No acute osseous findings.  PATIENT SURVEYS:  Eval:  FOTO 55 (projected 72 by visit 13)  COGNITION: Overall cognitive status: Within  functional limits for tasks assessed     SENSATION: WFL   MUSCLE LENGTH: Hamstrings: right slightly tighter than left LE  POSTURE: No Significant postural limitations   LOWER EXTREMITY ROM:  WFL  LOWER EXTREMITY MMT:  01/02/2023: Right LE strength of 5-/5 grossly throughout Left LE strength is WFL  LOWER EXTREMITY SPECIAL TESTS:  Knee special tests: Anterior drawer test: negative and Posterior drawer test: negative  FUNCTIONAL TESTS:   01/02/2023: 5 times sit to stand: 8.55 sec Single Leg Stance: 30 seconds on bilateral LE  Forward T assessment:  increased instability noted with RLE  GAIT: Distance walked: 15-20 min Assistive device utilized: None Level of assistance: Complete Independence Comments: Pt reports that when she walks for too long, she starts feel some discomfort on medial aspect of right knee.   TODAY'S TREATMENT:   02/02/23:Pt arrives for aquatic physical therapy. Treatment took place in 3.5-5.5 feet of water. Water temperature was 91 degrees F. Pt entered the pool via stairs independently with only light use of the rails. Pt requires buoyancy of water for support and to offload joints with strengthening exercises.  Pt utilizes viscosity of the water required for strengthening. Seated water bench with 75% submersion Pt performed seated LE AROM exercises 20x in all planes, ankle fins with LAQ,  concurrent discussion of current status. 75% depth water water with natural running arms 10x each direction. Hip kicks with ankle fins added Bil 15x no UE required for balance. Push double buoy floats by her side with high knee marching 4 lengths. Jogging in place 30 sec on 30 sec off. Then running on toes 4 lengths of pool. Step ups 2x15 on first step. Bicycle underwater 6 min on horseback mostly LE.           DATE: 01/30/2023: Elliptical level 1.0 x5 min FWD and x1 min backwards with PT present to discuss status Seated hamstring stretch 2x20 seconds  Seated piriformis stretch 2x20 sec bilat Side-lying reverse clamshell with black loop 2x10 bilat Side-lying clamshell with black loop 2x10 bilat Supine bridge with hip abduction with black loop around thighs 2x10 Side step lunge/curtsey 2x10 bilat Lateral and backwards lunge with foot on slider x10 each bilat (with cuing to only lunge as far as is comfortable on right knee) Leg Press (seat at 6) 90# 2x10 Single Leg Press (seat at 6) 40# x10 bilat Wall squat x10 Wall  squat with hold 5x5 sec          PATIENT EDUCATION:  Education details: Issued HEP Person educated: Patient Education method: Explanation, Facilities manager, and Handouts Education comprehension: verbalized understanding and returned demonstration  HOME EXERCISE PROGRAM: Access Code: [email or web address] URL: [email or web address] Date: 01/30/2023 Prepared by: Reather Laurence  Exercises - Bridge with Hip Abduction and Resistance - Ground Touches  - 1 x daily - 7 x weekly - 2 sets - 10 reps - Sidelying Reverse Clamshell with Resistance  - 1 x daily - 7 x weekly - 2 sets - 10 reps - Seated Hamstring Stretch  - 1-2 x daily - 7 x weekly - 2 reps - 20 sec hold - Seated Piriformis Stretch with Trunk Bend  - 1 x daily - 7 x weekly - 2 reps - 20 sec hold - Squat with Chair Touch  - 1-2 x daily - 7 x weekly - 2 sets - 10 reps - Side Stepping with Resistance at Ankles  - 1-2 x daily - 7 x weekly - 2 sets - 10 reps - [employer]  Walks  - 1-2 x daily - 7 x weekly - 2 sets - 10 reps - Backward Monster Walks  - 1-2 x daily - 7 x weekly - 2 sets - 10 reps - Forward T  - 1-2 x daily - 7 x weekly - 2 sets - 10 reps - Lateral Single Leg Lunge Jumps  - 1 x daily - 7 x weekly - 2 sets - 10 reps - Wall Slide with Posterior Pelvic Tilt  - 1 x daily - 7 x weekly - 1-2 sets - 10 reps - Wall Squat  - 1 x daily - 7 x weekly - 5 reps - 5 sec hold  ASSESSMENT:  CLINICAL IMPRESSION: Pt arrives to aquatic PT session post 2 day flare of right knee. Knee pain was not exacerbated in water even with trial of gentle jogging in place today. Pt begins her workouts with her running coach on Monday which will consist of cycling and rowing.   OBJECTIVE IMPAIRMENTS: decreased balance, difficulty walking, decreased strength, increased muscle spasms, impaired flexibility, and pain.   ACTIVITY LIMITATIONS: squatting, stairs, and running  PARTICIPATION LIMITATIONS: community activity and marathon training  PERSONAL  FACTORS: Time since onset of injury/illness/exacerbation and 1 comorbidity: OA  are also affecting patient's functional outcome.   REHAB POTENTIAL: Good  CLINICAL DECISION MAKING: Stable/uncomplicated  EVALUATION COMPLEXITY: Low   GOALS: Goals reviewed with patient? Yes  SHORT TERM GOALS: Target date: 01/19/2023 Pt will be independent with initial HEP. Baseline: Goal status: MET  2.  Patient will report at least a 25% improvement in symptoms since initial injury. Baseline:  Goal status: MET on 01/25/23   LONG TERM GOALS: Target date: 02/23/2023  Patient will be independent with advanced HEP. Baseline:  Goal status: Ongoing  2.  Pt will increase FOTO to at least 72 to demonstrate improvements in functional status. Baseline:  Goal status: INITIAL  3.  Patient to increase right LE strength to allow her to complete a Forward T with a level/stable pelvis. Baseline:  Goal status: Ongoing  4.  Patient to report being able to return to marathon training without increased pain. Baseline:  Goal status: INITIAL    PLAN:  PT FREQUENCY: 2x/week  PT DURATION: 8 weeks  PLANNED INTERVENTIONS: Therapeutic exercises, Therapeutic activity, Neuromuscular re-education, Balance training, Gait training, Patient/Family education, Self Care, Joint mobilization, Joint manipulation, Stair training, Aquatic Therapy, Dry Needling, Electrical stimulation, Cryotherapy, Moist heat, scar mobilization, Taping, Vasopneumatic device, Ultrasound, Ionotophoresis 4mg /ml Dexamethasone, Manual therapy, and Re-evaluation  PLAN FOR NEXT SESSION: Assess aquatic jogging  Ane Payment, PTA 02/02/23 5:49 PM   [facility] [address] [place], [place] [address] Phone # [phone] Fax [phone]

## 2023-02-09 ENCOUNTER — Encounter: Payer: Self-pay | Admitting: Physical Therapy

## 2023-02-09 ENCOUNTER — Ambulatory Visit: Payer: BC Managed Care – PPO | Attending: Sports Medicine | Admitting: Physical Therapy

## 2023-02-09 DIAGNOSIS — M6281 Muscle weakness (generalized): Secondary | ICD-10-CM | POA: Diagnosis present

## 2023-02-09 DIAGNOSIS — R252 Cramp and spasm: Secondary | ICD-10-CM | POA: Insufficient documentation

## 2023-02-09 DIAGNOSIS — R2689 Other abnormalities of gait and mobility: Secondary | ICD-10-CM | POA: Diagnosis not present

## 2023-02-09 NOTE — Therapy (Signed)
OUTPATIENT PHYSICAL THERAPY TREATMENT NOTE   Patient Name: Patty [name] MRN: [id number] DOB:Mar 12, 1971, 52 y.o., female Today's Date: 02/09/2023  END OF SESSION:  PT End of Session - 02/09/23 1506     Visit Number 8    Date for PT Re-Evaluation 02/23/23    Authorization Type BC/BS    PT Start Time 1505    PT Stop Time 1553    PT Time Calculation (min) 48 min    Activity Tolerance Patient tolerated treatment well    Behavior During Therapy WFL for tasks assessed/performed                  Past Medical History:  Diagnosis Date   Anxiety    History of recurrent UTIs    Hyperlipidemia    Palpitations    Sinus trouble    Past Surgical History:  Procedure Laterality Date   COLONOSCOPY  15 years ago    in [place]-normal   ENDOMETRIAL ABLATION  10/16/2013   SEPTOPLASTY     1998 and 2000   TURBINATE REDUCTION  08/2021   Patient Active Problem List   Diagnosis Date Noted   Acute meniscal tear of right knee 12/14/2022   Allergic rhinitis 11/28/2022   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   Sleep-disordered breathing 07/01/2021   Statin intolerance 05/23/2021   Costochondritis 01/26/2021   Polyarthralgia 01/26/2021   PAC (premature atrial contraction) 08/12/2020   Mixed hyperlipidemia 07/07/2020   Primary osteoarthritis of both knees 08/21/2017   History of tibial stress fracture 08/16/2015    PCP: Ardith Dark, MD  REFERRING PROVIDER: Monica Becton, MD  REFERRING DIAG: S83.206D (ICD-10-CM) - Acute meniscal tear of right knee, subsequent encounter  THERAPY DIAG:  Other abnormalities of gait and mobility  Muscle weakness (generalized)  Cramp and spasm  Rationale for Evaluation and Treatment: Rehabilitation  ONSET DATE: December 11, 2022  SUBJECTIVE:   SUBJECTIVE STATEMENT: Did a little rowing and cycling but my knee is a little swollen and aggrevated. I think what we did in the pool last week was fine but I want to hold on running for 1  week.   PERTINENT HISTORY: OA with last Monovisc injection November 2023 PAIN:  Are you having pain? Yes: NPRS scale: 5-6/10 Pain location: right knee (but has history of left knee pain, as well) Pain description: throbbing Aggravating factors: inactivity/prolonged sitting and standing Relieving factors: movement  PRECAUTIONS: None  RED FLAGS: None   WEIGHT BEARING RESTRICTIONS: No  FALLS:  Has patient fallen in last 6 months? 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  Pt has a planned marathon in [place] in December 2024 that she is hoping to be able to complete.  NEXT MD VISIT: Dr Everardo Pacific 01/05/2023, Dr Benjamin Stain once injections approved.  OBJECTIVE:   DIAGNOSTIC FINDINGS:  Right knee MRI on 12/18/2022: IMPRESSION: 1. Large oblique undersurface tear of the posterior horn and body of the medial meniscus, as described. 2. The lateral meniscus, cruciate and collateral ligaments are intact. 3. Mildly progressive tricompartmental degenerative chondrosis with small joint effusion. No acute osseous findings.  PATIENT SURVEYS:  Eval:  FOTO 55 (projected 72 by visit  13)  COGNITION: Overall cognitive status: Within functional limits for tasks assessed     SENSATION: WFL   MUSCLE LENGTH: Hamstrings: right slightly tighter than left LE  POSTURE: No Significant postural limitations   LOWER EXTREMITY ROM:  WFL  LOWER EXTREMITY MMT:  01/02/2023: Right LE strength of 5-/5 grossly throughout Left LE strength is WFL  LOWER EXTREMITY SPECIAL TESTS:  Knee special tests: Anterior drawer test:  negative and Posterior drawer test: negative  FUNCTIONAL TESTS:  01/02/2023: 5 times sit to stand: 8.55 sec Single Leg Stance: 30 seconds on bilateral LE  Forward T assessment:  increased instability noted with RLE  GAIT: Distance walked: 15-20 min Assistive device utilized: None Level of assistance: Complete Independence Comments: Pt reports that when she walks for too long, she starts feel some discomfort on medial aspect of right knee.   TODAY'S TREATMENT:   02/09/23:Pt arrives for aquatic physical therapy. Treatment took place in 3.5-5.5 feet of water. Water temperature was 91 degrees F. Pt entered the pool via stairs independently with only light use of the rails. Pt requires buoyancy of water for support and to offload joints with strengthening exercises.  Pt utilizes viscosity of the water required for strengthening. Seated water bench with 75% submersion Pt performed seated LE AROM exercises 20x in all planes, ankle fins with LAQ,  concurrent discussion of current status. 75% depth water water with natural running arms 10x each direction VC to take longer strides. Single limb stance 30 sec with current provided Bil 3x. Hip kicks with ankle fins Bil 20x no UE required for balance. Push double buoy floats by her side with high knee marching 6 lengths. Step ups 2x15 on first step first set Bil UE, tehn second set 1 hand only. Bicycle underwater 8 min on horseback mostly LE.           02/02/23:Pt arrives for aquatic physical therapy. Treatment took place in 3.5-5.5 feet of water. Water temperature was 91 degrees F. Pt entered the pool via stairs independently with only light use of the rails. Pt requires buoyancy of water for support and to offload joints with strengthening exercises.  Pt utilizes viscosity of the water required for strengthening. Seated water bench with 75% submersion Pt performed seated LE AROM exercises 20x in all planes, ankle fins with LAQ,  concurrent discussion of  current status. 75% depth water water with natural running arms 10x each direction. Hip kicks with ankle fins added Bil 15x no UE required for balance. Push double buoy floats by her side with high knee marching 4 lengths. Jogging in place 30 sec on 30 sec off. Then running on toes 4 lengths of pool. Step ups 2x15 on first step. Bicycle underwater 6 min on horseback mostly LE.           PATIENT EDUCATION:  Education details: Issued HEP Person educated: Patient Education method: Explanation, Demonstration, and Handouts Education comprehension: verbalized understanding and returned demonstration  HOME EXERCISE PROGRAM: Access Code: [email or web address] URL: [email or web address] Date: 01/30/2023 Prepared by: Reather Laurence  Exercises - Bridge with Hip Abduction and Resistance - Ground Touches  - 1 x daily - 7 x weekly - 2 sets - 10 reps - Sidelying Reverse Clamshell with Resistance  - 1 x daily - 7 x weekly - 2 sets - 10 reps - Seated Hamstring Stretch  - 1-2 x daily - 7 x weekly - 2 reps - 20 sec hold - Seated Piriformis Stretch with Trunk Bend  - 1 x  daily - 7 x weekly - 2 reps - 20 sec hold - Squat with Chair Touch  - 1-2 x daily - 7 x weekly - 2 sets - 10 reps - Side Stepping with Resistance at Ankles  - 1-2 x daily - 7 x weekly - 2 sets - 10 reps - Forward Monster Walks  - 1-2 x daily - 7 x weekly - 2 sets - 10 reps - Backward Monster Walks  - 1-2 x daily - 7 x weekly - 2 sets - 10 reps - Forward T  - 1-2 x daily - 7 x weekly - 2 sets - 10 reps - Lateral Single Leg Lunge Jumps  - 1 x daily - 7 x weekly - 2 sets - 10 reps - Wall Slide with Posterior Pelvic Tilt  - 1 x daily - 7 x weekly - 1-2 sets - 10 reps - Wall Squat  - 1 x daily - 7 x weekly - 5 reps - 5 sec hold  ASSESSMENT:  CLINICAL IMPRESSION: Pt arrives with some mild-moderate RT knee swelling. She is tender to touch medially. All exercises in the pool were pain free. Educated pt in self edema massage she can do over the  next few days. Pt demonstrates instability in the RT hip vs the LT in single limb stance.  OBJECTIVE IMPAIRMENTS: decreased balance, difficulty walking, decreased strength, increased muscle spasms, impaired flexibility, and pain.   ACTIVITY LIMITATIONS: squatting, stairs, and running  PARTICIPATION LIMITATIONS: community activity and marathon training  PERSONAL FACTORS: Time since onset of injury/illness/exacerbation and 1 comorbidity: OA  are also affecting patient's functional outcome.   REHAB POTENTIAL: Good  CLINICAL DECISION MAKING: Stable/uncomplicated  EVALUATION COMPLEXITY: Low   GOALS: Goals reviewed with patient? Yes  SHORT TERM GOALS: Target date: 01/19/2023 Pt will be independent with initial HEP. Baseline: Goal status: MET  2.  Patient will report at least a 25% improvement in symptoms since initial injury. Baseline:  Goal status: MET on 01/25/23   LONG TERM GOALS: Target date: 02/23/2023  Patient will be independent with advanced HEP. Baseline:  Goal status: Ongoing  2.  Pt will increase FOTO to at least 72 to demonstrate improvements in functional status. Baseline:  Goal status: INITIAL  3.  Patient to increase right LE strength to allow her to complete a Forward T with a level/stable pelvis. Baseline:  Goal status: Ongoing  4.  Patient to report being able to return to marathon training without increased pain. Baseline:  Goal status: INITIAL    PLAN:  PT FREQUENCY: 2x/week  PT DURATION: 8 weeks  PLANNED INTERVENTIONS: Therapeutic exercises, Therapeutic activity, Neuromuscular re-education, Balance training, Gait training, Patient/Family education, Self Care, Joint mobilization, Joint manipulation, Stair training, Aquatic Therapy, Dry Needling, Electrical stimulation, Cryotherapy, Moist heat, scar mobilization, Taping, Vasopneumatic device, Ultrasound, Ionotophoresis 4mg /ml Dexamethasone, Manual therapy, and Re-evaluation  PLAN FOR NEXT SESSION:  Assess knee swelling  Ane Payment, PTA 02/09/23 3:57 PM   [facility] [address], [address] [place], [place] [address] Phone # [phone] Fax [phone]

## 2023-02-12 ENCOUNTER — Encounter: Payer: Self-pay | Admitting: Sports Medicine

## 2023-02-12 ENCOUNTER — Ambulatory Visit: Payer: BC Managed Care – PPO | Attending: Internal Medicine

## 2023-02-12 DIAGNOSIS — E782 Mixed hyperlipidemia: Secondary | ICD-10-CM

## 2023-02-12 DIAGNOSIS — E7841 Elevated Lipoprotein(a): Secondary | ICD-10-CM | POA: Diagnosis not present

## 2023-02-13 ENCOUNTER — Encounter: Payer: Self-pay | Admitting: Internal Medicine

## 2023-02-13 LAB — LIPID PANEL
Chol/HDL Ratio: 2.3 ratio (ref 0.0–4.4)
Cholesterol, Total: 185 mg/dL (ref 100–199)
HDL: 80 mg/dL (ref >39)
LDL Chol Calc (NIH): 93 mg/dL (ref 0–99)
Triglycerides: 64 mg/dL (ref 0–149)
VLDL Cholesterol Cal: 12 mg/dL (ref 5–40)

## 2023-02-13 LAB — ALT: ALT: 22 IU/L (ref 0–32)

## 2023-02-14 ENCOUNTER — Other Ambulatory Visit: Payer: Self-pay

## 2023-02-14 DIAGNOSIS — R002 Palpitations: Secondary | ICD-10-CM

## 2023-02-14 DIAGNOSIS — R079 Chest pain, unspecified: Secondary | ICD-10-CM

## 2023-02-14 DIAGNOSIS — E7841 Elevated Lipoprotein(a): Secondary | ICD-10-CM

## 2023-02-14 DIAGNOSIS — Z789 Other specified health status: Secondary | ICD-10-CM

## 2023-02-14 DIAGNOSIS — E782 Mixed hyperlipidemia: Secondary | ICD-10-CM

## 2023-02-14 NOTE — Progress Notes (Signed)
Orders for hs-CRP Lab placed

## 2023-02-16 ENCOUNTER — Ambulatory Visit: Payer: BC Managed Care – PPO | Attending: Internal Medicine

## 2023-02-16 ENCOUNTER — Encounter: Payer: Self-pay | Admitting: Physical Therapy

## 2023-02-16 ENCOUNTER — Ambulatory Visit: Payer: BC Managed Care – PPO | Admitting: Physical Therapy

## 2023-02-16 DIAGNOSIS — R079 Chest pain, unspecified: Secondary | ICD-10-CM

## 2023-02-16 DIAGNOSIS — E7841 Elevated Lipoprotein(a): Secondary | ICD-10-CM | POA: Diagnosis not present

## 2023-02-16 DIAGNOSIS — R2689 Other abnormalities of gait and mobility: Secondary | ICD-10-CM

## 2023-02-16 DIAGNOSIS — E782 Mixed hyperlipidemia: Secondary | ICD-10-CM

## 2023-02-16 DIAGNOSIS — R252 Cramp and spasm: Secondary | ICD-10-CM | POA: Diagnosis not present

## 2023-02-16 DIAGNOSIS — Z789 Other specified health status: Secondary | ICD-10-CM | POA: Diagnosis not present

## 2023-02-16 DIAGNOSIS — R002 Palpitations: Secondary | ICD-10-CM

## 2023-02-16 DIAGNOSIS — M6281 Muscle weakness (generalized): Secondary | ICD-10-CM

## 2023-02-16 NOTE — Therapy (Signed)
OUTPATIENT PHYSICAL THERAPY TREATMENT NOTE   Patient Name: Patty [name] MRN: [id number] DOB:1971/03/20, 52 y.o., female Today's Date: 02/16/2023  END OF SESSION:  PT End of Session - 02/16/23 2158     Visit Number 9    Date for PT Re-Evaluation 02/23/23    Authorization Type BC/BS    PT Start Time 1515    PT Stop Time 1600    PT Time Calculation (min) 45 min    Activity Tolerance Patient tolerated treatment well    Behavior During Therapy WFL for tasks assessed/performed                  Past Medical History:  Diagnosis Date   Anxiety    History of recurrent UTIs    Hyperlipidemia    Palpitations    Sinus trouble    Past Surgical History:  Procedure Laterality Date   COLONOSCOPY  15 years ago    in [place]-normal   ENDOMETRIAL ABLATION  10/16/2013   SEPTOPLASTY     1998 and 2000   TURBINATE REDUCTION  08/2021   Patient Active Problem List   Diagnosis Date Noted   Acute meniscal tear of right knee 12/14/2022   Allergic rhinitis 11/28/2022   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   Sleep-disordered breathing 07/01/2021   Statin intolerance 05/23/2021   Costochondritis 01/26/2021   Polyarthralgia 01/26/2021   PAC (premature atrial contraction) 08/12/2020   Mixed hyperlipidemia 07/07/2020   Primary osteoarthritis of both knees 08/21/2017   History of tibial stress fracture 08/16/2015    PCP: Ardith Dark, MD  REFERRING PROVIDER: Monica Becton, MD  REFERRING DIAG: S83.206D (ICD-10-CM) - Acute meniscal tear of right knee, subsequent encounter  THERAPY DIAG:  Other abnormalities of gait and mobility  Muscle weakness (generalized)  Cramp and spasm  Rationale for Evaluation and Treatment: Rehabilitation  ONSET DATE: December 11, 2022  SUBJECTIVE:   SUBJECTIVE STATEMENT: I had a fall over this step on the weekend. Big bruise distal quad, knee continues to have moderate edema. Pt reports she is cycling ok.  PERTINENT HISTORY: OA with last  Monovisc injection November 2023 PAIN:  Are you having pain? Yes: NPRS scale: 5-6/10 Pain location: right knee (but has history of left knee pain, as well) Pain description: throbbing Aggravating factors: inactivity/prolonged sitting and standing Relieving factors: movement  PRECAUTIONS: None  RED FLAGS: None   WEIGHT BEARING RESTRICTIONS: No  FALLS:  Has patient fallen in last 6 months? 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  Pt has a planned marathon in [place] in December 2024 that she is hoping to be able to complete.  NEXT MD VISIT: Dr Everardo Pacific 01/05/2023, Dr Benjamin Stain once injections approved.  OBJECTIVE:   DIAGNOSTIC FINDINGS:  Right knee MRI on 12/18/2022: IMPRESSION: 1. Large oblique undersurface tear of the posterior horn and body of the medial meniscus, as described. 2. The lateral meniscus, cruciate and collateral ligaments are intact. 3. Mildly progressive tricompartmental degenerative chondrosis with small joint effusion. No acute osseous findings.  PATIENT SURVEYS:  Eval:  FOTO 55 (projected 72 by visit 13)  COGNITION: Overall cognitive status: Within functional limits for tasks assessed  SENSATION: WFL   MUSCLE LENGTH: Hamstrings: right slightly tighter than left LE  POSTURE: No Significant postural limitations   LOWER EXTREMITY ROM:  WFL  LOWER EXTREMITY MMT:  01/02/2023: Right LE strength of 5-/5 grossly throughout Left LE strength is WFL  LOWER EXTREMITY SPECIAL TESTS:  Knee special tests: Anterior drawer test: negative and Posterior drawer test:  negative  FUNCTIONAL TESTS:  01/02/2023: 5 times sit to stand: 8.55 sec Single Leg Stance: 30 seconds on bilateral LE  Forward T assessment:  increased instability noted with RLE  GAIT: Distance walked: 15-20 min Assistive device utilized: None Level of assistance: Complete Independence Comments: Pt reports that when she walks for too long, she starts feel some discomfort on medial aspect of right knee.   TODAY'S TREATMENT:   02/16/23:Pt arrives for aquatic physical therapy. Treatment took place in 3.5-5.5 feet of water. Water temperature was 91 degrees F. Pt entered the pool via stairs independently with only light use of the rails. Pt requires buoyancy of water for support and to offload joints with strengthening exercises.  Pt utilizes viscosity of the water required for strengthening. Seated water bench with 75% submersion Pt performed seated LE AROM exercises 20x in all planes, ankle fins with LAQ,  concurrent discussion of current status. 75% depth water water with natural running arms 10x each direction VC to take longer strides. Single limb stance 30 sec with current provided Bil 3x. Hip kicks with ankle fins Bil 20x no UE required for balance. Push double buoy floats by her side with high knee marching 6 lengths. Step ups 2x15 on first step first set Bil UE, then second set 1 hand only. Bicycle underwater 8 min on horseback mostly LE.            02/09/23:Pt arrives for aquatic physical therapy. Treatment took place in 3.5-5.5 feet of water. Water temperature was 91 degrees F. Pt entered the pool via stairs independently with only light use of the rails. Pt requires buoyancy of water for support and to offload joints with strengthening exercises.  Pt utilizes viscosity of the water required for strengthening. Seated water bench with 75% submersion Pt performed seated LE AROM exercises 20x in all planes, ankle fins with LAQ,  concurrent discussion of current status. 75% depth water water  with natural running arms 10x each direction VC to take longer strides. Single limb stance 30 sec with current provided Bil 3x. Hip kicks with ankle fins Bil 20x no UE required for balance. Push double buoy floats by her side with high knee marching 6 lengths. Step ups 2x15 on first step first set Bil UE, tehn second set 1 hand only. Bicycle underwater 8 min on horseback mostly LE.            PATIENT EDUCATION:  Education details: Issued HEP Person educated: Patient Education method: Explanation, Demonstration, and Handouts Education comprehension: verbalized understanding and returned demonstration  HOME EXERCISE PROGRAM: Access Code: [email or web address] URL: [email or web address] Date: 01/30/2023 Prepared by: Reather Laurence  Exercises - Bridge with Hip Abduction and Resistance - Ground Touches  - 1 x daily - 7 x weekly - 2 sets - 10 reps - Sidelying Reverse Clamshell with Resistance  - 1 x daily - 7 x weekly - 2 sets - 10 reps - Seated Hamstring Stretch  - 1-2 x daily - 7 x weekly - 2 reps - 20 sec hold - Seated Piriformis Stretch with Trunk Bend  - 1 x daily - 7 x weekly - 2  reps - 20 sec hold - Squat with Chair Touch  - 1-2 x daily - 7 x weekly - 2 sets - 10 reps - Side Stepping with Resistance at Ankles  - 1-2 x daily - 7 x weekly - 2 sets - 10 reps - Forward Monster Walks  - 1-2 x daily - 7 x weekly - 2 sets - 10 reps - Backward Monster Walks  - 1-2 x daily - 7 x weekly - 2 sets - 10 reps - Forward T  - 1-2 x daily - 7 x weekly - 2 sets - 10 reps - Lateral Single Leg Lunge Jumps  - 1 x daily - 7 x weekly - 2 sets - 10 reps - Wall Slide with Posterior Pelvic Tilt  - 1 x daily - 7 x weekly - 1-2 sets - 10 reps - Wall Squat  - 1 x daily - 7 x weekly - 5 reps - 5 sec hold  ASSESSMENT:  CLINICAL IMPRESSION: Pt had fall over weekend tripping over a step. She has a large bruise distal quad and knee remains moderately swollen. She is also tender to touch medially. Again, aquaitc  exercises do not reproduce any pain/symptoms.   OBJECTIVE IMPAIRMENTS: decreased balance, difficulty walking, decreased strength, increased muscle spasms, impaired flexibility, and pain.   ACTIVITY LIMITATIONS: squatting, stairs, and running  PARTICIPATION LIMITATIONS: community activity and marathon training  PERSONAL FACTORS: Time since onset of injury/illness/exacerbation and 1 comorbidity: OA  are also affecting patient's functional outcome.   REHAB POTENTIAL: Good  CLINICAL DECISION MAKING: Stable/uncomplicated  EVALUATION COMPLEXITY: Low   GOALS: Goals reviewed with patient? Yes  SHORT TERM GOALS: Target date: 01/19/2023 Pt will be independent with initial HEP. Baseline: Goal status: MET  2.  Patient will report at least a 25% improvement in symptoms since initial injury. Baseline:  Goal status: MET on 01/25/23   LONG TERM GOALS: Target date: 02/23/2023  Patient will be independent with advanced HEP. Baseline:  Goal status: Ongoing  2.  Pt will increase FOTO to at least 72 to demonstrate improvements in functional status. Baseline:  Goal status: INITIAL  3.  Patient to increase right LE strength to allow her to complete a Forward T with a level/stable pelvis. Baseline:  Goal status: Ongoing  4.  Patient to report being able to return to marathon training without increased pain. Baseline:  Goal status: INITIAL    PLAN:  PT FREQUENCY: 2x/week  PT DURATION: 8 weeks  PLANNED INTERVENTIONS: Therapeutic exercises, Therapeutic activity, Neuromuscular re-education, Balance training, Gait training, Patient/Family education, Self Care, Joint mobilization, Joint manipulation, Stair training, Aquatic Therapy, Dry Needling, Electrical stimulation, Cryotherapy, Moist heat, scar mobilization, Taping, Vasopneumatic device, Ultrasound, Ionotophoresis 4mg /ml Dexamethasone, Manual therapy, and Re-evaluation  PLAN FOR NEXT SESSION: Assess knee swelling, speak to MD about a  Zilretta injection.   Ane Payment, PTA 02/16/23 9:59 PM   [facility] [address] [place], [place] [address] Phone # [phone] Fax [phone]

## 2023-02-17 LAB — HIGH SENSITIVITY CRP: CRP, High Sensitivity: 0.36 mg/L (ref 0.00–3.00)

## 2023-02-20 NOTE — Therapy (Unsigned)
OUTPATIENT PHYSICAL THERAPY TREATMENT NOTE   Patient Name: Patty [name] MRN: [id number] DOB:1971-04-11, 52 y.o., female Today's Date: 02/21/2023  END OF SESSION:  PT End of Session - 02/21/23 1015     Visit Number 10    Date for PT Re-Evaluation 02/23/23    Authorization Type BC/BS    PT Start Time 1015    PT Stop Time 1100    PT Time Calculation (min) 45 min    Activity Tolerance Patient tolerated treatment well    Behavior During Therapy WFL for tasks assessed/performed                   Past Medical History:  Diagnosis Date   Anxiety    History of recurrent UTIs    Hyperlipidemia    Palpitations    Sinus trouble    Past Surgical History:  Procedure Laterality Date   COLONOSCOPY  15 years ago    in [place]-normal   ENDOMETRIAL ABLATION  10/16/2013   SEPTOPLASTY     1998 and 2000   TURBINATE REDUCTION  08/2021   Patient Active Problem List   Diagnosis Date Noted   Acute meniscal tear of right knee 12/14/2022   Allergic rhinitis 11/28/2022   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   Sleep-disordered breathing 07/01/2021   Statin intolerance 05/23/2021   Costochondritis 01/26/2021   Polyarthralgia 01/26/2021   PAC (premature atrial contraction) 08/12/2020   Mixed hyperlipidemia 07/07/2020   Primary osteoarthritis of both knees 08/21/2017   History of tibial stress fracture 08/16/2015    PCP: Ardith Dark, MD  REFERRING PROVIDER: Monica Becton, MD  REFERRING DIAG: S83.206D (ICD-10-CM) - Acute meniscal tear of right knee, subsequent encounter  THERAPY DIAG:  Other abnormalities of gait and mobility  Muscle weakness (generalized)  Cramp and spasm  Rationale for Evaluation and Treatment: Rehabilitation  ONSET DATE: December 11, 2022  SUBJECTIVE:   SUBJECTIVE STATEMENT: Pool was good on Friday, nothing bad after that. I decided to totally rest for a few days and my Rt knee feels like 75% improved since my flare up. I think it has taken  about 2 weeks for my knee to calm down. I think my land PT will need to be more gradual.  PERTINENT HISTORY: OA with last Monovisc injection November 2023 PAIN:  Are you having pain? Yes: NPRS scale: 0-2/10 Pain location: right knee (but has history of left knee pain, as well) Pain description: throbbing Aggravating factors: inactivity/prolonged sitting and standing Relieving factors: movement  PRECAUTIONS: None  RED FLAGS: None   WEIGHT BEARING RESTRICTIONS: No  FALLS:  Has patient fallen in last 6 months? 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  Pt has a planned marathon in [place] in December 2024 that she is hoping to be able to complete.  NEXT MD VISIT: Dr Everardo Pacific 01/05/2023, Dr Benjamin Stain once injections approved.  OBJECTIVE:   DIAGNOSTIC FINDINGS:  Right knee MRI on 12/18/2022: IMPRESSION: 1. Large oblique undersurface tear of the posterior horn and body of the medial meniscus, as described. 2. The lateral meniscus, cruciate and collateral ligaments are intact. 3. Mildly progressive tricompartmental degenerative chondrosis with small  joint effusion. No acute osseous findings.  PATIENT SURVEYS:  Eval:  FOTO 55 (projected 72 by visit 13)  COGNITION: Overall cognitive status: Within functional limits for tasks assessed     SENSATION: WFL   MUSCLE LENGTH: Hamstrings: right slightly tighter than left LE  POSTURE: No Significant postural limitations   LOWER EXTREMITY ROM:  WFL  LOWER EXTREMITY MMT:  01/02/2023: Right LE strength of 5-/5 grossly throughout Left LE strength is  WFL  LOWER EXTREMITY SPECIAL TESTS:  Knee special tests: Anterior drawer test: negative and Posterior drawer test: negative  FUNCTIONAL TESTS:  01/02/2023: 5 times sit to stand: 8.55 sec Single Leg Stance: 30 seconds on bilateral LE  Forward T assessment:  increased instability noted with RLE  GAIT: Distance walked: 15-20 min Assistive device utilized: None Level of assistance: Complete Independence Comments: Pt reports that when she walks for too long, she starts feel some discomfort on medial aspect of right knee.   TODAY'S TREATMENT:   02/21/23:Pt arrives for aquatic physical therapy. Treatment took place in 3.5-5.5 feet of water. Water temperature was 91 degrees F. Pt entered the pool via stairs independently with only light use of the rails. Pt requires buoyancy of water for support and to offload joints with strengthening exercises.  Pt utilizes viscosity of the water required for strengthening. Seated water bench with 75% submersion Pt performed seated LE AROM exercises 20x in all planes, ankle fins with LAQ, soft tissue work to RT distal quad bruise as it was getting very hard. Color is improving, concurrent discussion of current status. 75% depth water water with single buoy hand float push/pull and added ankle fins 10x each direction VC to take longer strides. Tandem stance with current provided by PTA 30 sec 3x, then single limb stance 30 sec with current provided Bil 3x. Hip kicks with ankle fins Bil 20x no UE required for balance. No time for bicycle today.   02/16/23:Pt arrives for aquatic physical therapy. Treatment took place in 3.5-5.5 feet of water. Water temperature was 91 degrees F. Pt entered the pool via stairs independently with only light use of the rails. Pt requires buoyancy of water for support and to offload joints with strengthening exercises.  Pt utilizes viscosity of the water required for strengthening. Seated water bench with 75% submersion Pt performed seated LE  AROM exercises 20x in all planes, ankle fins with LAQ,  concurrent discussion of current status. 75% depth water water with natural running arms 10x each direction VC to take longer strides. Single limb stance 30 sec with current provided Bil 3x. Hip kicks with ankle fins Bil 20x no UE required for balance. Push double buoy floats by her side with high knee marching 6 lengths. Step ups 2x15 on first step first set Bil UE, then second set 1 hand only. Bicycle underwater 8 min on horseback mostly LE.                  PATIENT EDUCATION:  Education details: Issued HEP Person educated: Patient Education method: Explanation, Demonstration, and Handouts Education comprehension: verbalized understanding and returned demonstration  HOME EXERCISE PROGRAM: Access Code: [email or web address] URL: [email or web address] Date: 01/30/2023 Prepared by: Reather Laurence  Exercises - Bridge with Hip Abduction and Resistance - Ground Touches  - 1 x daily - 7 x weekly - 2 sets - 10 reps - Sidelying Reverse Clamshell with Resistance  - 1 x daily - 7 x weekly - 2 sets - 10 reps - Seated Hamstring Stretch  -  1-2 x daily - 7 x weekly - 2 reps - 20 sec hold - Seated Piriformis Stretch with Trunk Bend  - 1 x daily - 7 x weekly - 2 reps - 20 sec hold - Squat with Chair Touch  - 1-2 x daily - 7 x weekly - 2 sets - 10 reps - Side Stepping with Resistance at Ankles  - 1-2 x daily - 7 x weekly - 2 sets - 10 reps - Forward Monster Walks  - 1-2 x daily - 7 x weekly - 2 sets - 10 reps - Backward Monster Walks  - 1-2 x daily - 7 x weekly - 2 sets - 10 reps - Forward T  - 1-2 x daily - 7 x weekly - 2 sets - 10 reps - Lateral Single Leg Lunge Jumps  - 1 x daily - 7 x weekly - 2 sets - 10 reps - Wall Slide with Posterior Pelvic Tilt  - 1 x daily - 7 x weekly - 1-2 sets - 10 reps - Wall Squat  - 1 x daily - 7 x weekly - 5 reps - 5 sec hold  ASSESSMENT:  CLINICAL IMPRESSION: Bruising improving but very hard to touch. Spent  some time performing soft tissue work under the water to soften the bruise. Pt feels she is 75% back to where she was was prior to her original flare up about 2 weeks ago. She continues to have no negative outcomes form the pool and would benefit from more session sso we can try to progress to running again.   OBJECTIVE IMPAIRMENTS: decreased balance, difficulty walking, decreased strength, increased muscle spasms, impaired flexibility, and pain.   ACTIVITY LIMITATIONS: squatting, stairs, and running  PARTICIPATION LIMITATIONS: community activity and marathon training  PERSONAL FACTORS: Time since onset of injury/illness/exacerbation and 1 comorbidity: OA  are also affecting patient's functional outcome.   REHAB POTENTIAL: Good  CLINICAL DECISION MAKING: Stable/uncomplicated  EVALUATION COMPLEXITY: Low   GOALS: Goals reviewed with patient? Yes  SHORT TERM GOALS: Target date: 01/19/2023 Pt will be independent with initial HEP. Baseline: Goal status: MET  2.  Patient will report at least a 25% improvement in symptoms since initial injury. Baseline:  Goal status: MET on 01/25/23   LONG TERM GOALS: Target date: 02/23/2023  Patient will be independent with advanced HEP. Baseline:  Goal status: Ongoing  2.  Pt will increase FOTO to at least 72 to demonstrate improvements in functional status. Baseline:  Goal status: INITIAL  3.  Patient to increase right LE strength to allow her to complete a Forward T with a level/stable pelvis. Baseline:  Goal status: Ongoing  4.  Patient to report being able to return to marathon training without increased pain. Baseline:  Goal status: INITIAL    PLAN:  PT FREQUENCY: 2x/week  PT DURATION: 8 weeks  PLANNED INTERVENTIONS: Therapeutic exercises, Therapeutic activity, Neuromuscular re-education, Balance training, Gait training, Patient/Family education, Self Care, Joint mobilization, Joint manipulation, Stair training, Aquatic Therapy,  Dry Needling, Electrical stimulation, Cryotherapy, Moist heat, scar mobilization, Taping, Vasopneumatic device, Ultrasound, Ionotophoresis 4mg /ml Dexamethasone, Manual therapy, and Re-evaluation  PLAN FOR NEXT SESSION: ERO next land visit. Pt is nervous about progressing too fast on land since this flare up lasted about 2 weeks. She is interested in more hip hinging and other hip stabilization exercises vs lunges. Knee still appears to have some edema. Pt reports she is planning on pursuing PRP.   Ane Payment, PTA 02/21/23 11:59 AM   [facility] [address], [address] [place], [place] [address] Phone # [phone] Fax [phone]

## 2023-02-21 ENCOUNTER — Ambulatory Visit: Payer: BC Managed Care – PPO | Admitting: Physical Therapy

## 2023-02-21 ENCOUNTER — Encounter: Payer: Self-pay | Admitting: Physical Therapy

## 2023-02-21 DIAGNOSIS — R252 Cramp and spasm: Secondary | ICD-10-CM | POA: Diagnosis not present

## 2023-02-21 DIAGNOSIS — M6281 Muscle weakness (generalized): Secondary | ICD-10-CM | POA: Diagnosis not present

## 2023-02-21 DIAGNOSIS — R2689 Other abnormalities of gait and mobility: Secondary | ICD-10-CM

## 2023-02-22 ENCOUNTER — Encounter: Payer: Self-pay | Admitting: Rehabilitative and Restorative Service Providers"

## 2023-02-22 ENCOUNTER — Ambulatory Visit: Payer: BC Managed Care – PPO | Admitting: Rehabilitative and Restorative Service Providers"

## 2023-02-22 DIAGNOSIS — M6281 Muscle weakness (generalized): Secondary | ICD-10-CM

## 2023-02-22 DIAGNOSIS — R2689 Other abnormalities of gait and mobility: Secondary | ICD-10-CM

## 2023-02-22 DIAGNOSIS — R252 Cramp and spasm: Secondary | ICD-10-CM | POA: Diagnosis not present

## 2023-02-22 NOTE — Therapy (Signed)
OUTPATIENT PHYSICAL THERAPY TREATMENT NOTE AND REASSESSMENT NOTE   Patient Name: Patty [name] MRN: [id number] DOB:[birth year], 52 y.o., female Today's Date: 02/22/2023  END OF SESSION:  PT End of Session - 02/22/23 1111     Visit Number 11    Date for PT Re-Evaluation 04/20/23    Authorization Type BC/BS    PT Start Time 1108   Pt arrived late for appointment   PT Stop Time 1140    PT Time Calculation (min) 32 min    Activity Tolerance Patient tolerated treatment well    Behavior During Therapy [facility] for tasks assessed/performed                   Past Medical History:  Diagnosis Date   Anxiety    History of recurrent UTIs    Hyperlipidemia    Palpitations    Sinus trouble    Past Surgical History:  Procedure Laterality Date   COLONOSCOPY  15 years ago    in [place]-normal   ENDOMETRIAL ABLATION  10/16/2013   SEPTOPLASTY     1998 and 2000   TURBINATE REDUCTION  08/2021   Patient Active Problem List   Diagnosis Date Noted   Acute meniscal tear of right knee 12/14/2022   Allergic rhinitis 11/28/2022   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   Sleep-disordered breathing 07/01/2021   Statin intolerance 05/23/2021   Costochondritis 01/26/2021   Polyarthralgia 01/26/2021   PAC (premature atrial contraction) 08/12/2020   Mixed hyperlipidemia 07/07/2020   Primary osteoarthritis of both knees 08/21/2017   History of tibial stress fracture 08/16/2015    PCP: Ardith Dark, MD  REFERRING PROVIDER: Monica Becton, MD  REFERRING DIAG: S83.206D (ICD-10-CM) - Acute meniscal tear of right knee, subsequent encounter  THERAPY DIAG:  Other abnormalities of gait and mobility - Plan: PT plan of care cert/re-cert  Muscle weakness (generalized) - Plan: PT plan of care cert/re-cert  Cramp and spasm - Plan: PT plan of care cert/re-cert  Rationale for Evaluation and Treatment: Rehabilitation  ONSET DATE: December 11, 2022  SUBJECTIVE:   SUBJECTIVE STATEMENT: Pool  was good on Friday, nothing bad after that. I decided to totally rest for a few days and my Rt knee feels like 75% improved since my flare up. I think it has taken about 2 weeks for my knee to calm down. I think my land PT will need to be more gradual.  PERTINENT HISTORY: OA with last Monovisc injection November 2023 PAIN:  Are you having pain? Yes: NPRS scale: 0-2/10 Pain location: right knee (but has history of left knee pain, as well) Pain description: throbbing Aggravating factors: inactivity/prolonged sitting and standing Relieving factors: movement  PRECAUTIONS: None  RED FLAGS: None   WEIGHT BEARING RESTRICTIONS: No  FALLS:  Has patient fallen in last 6 months? 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  Pt has a planned marathon in [place] in December 2024 that she is hoping to be able to complete.  NEXT MD VISIT: Dr Everardo Pacific 01/05/2023, Dr Benjamin Stain once injections approved.  OBJECTIVE:   DIAGNOSTIC FINDINGS:  Right knee MRI on 12/18/2022: IMPRESSION: 1. Large oblique undersurface tear  of the posterior horn and body of the medial meniscus, as described. 2. The lateral meniscus, cruciate and collateral ligaments are intact. 3. Mildly progressive tricompartmental degenerative chondrosis with small joint effusion. No acute osseous findings.  PATIENT SURVEYS:  Eval:  FOTO 55 (projected 72 by visit 13)  02/22/2023:  FOTO 67  COGNITION: Overall cognitive status: Within functional limits for tasks assessed     SENSATION: WFL   MUSCLE LENGTH: Hamstrings: right slightly tighter than left  LE  POSTURE: No Significant postural limitations   LOWER EXTREMITY ROM:  WFL  LOWER EXTREMITY MMT:  01/02/2023: Right LE strength of 5-/5 grossly throughout Left LE strength is WFL  LOWER EXTREMITY SPECIAL TESTS:  Knee special tests: Anterior drawer test: negative and Posterior drawer test: negative  FUNCTIONAL TESTS:  01/02/2023: 5 times sit to stand: 8.55 sec Single Leg Stance: 30 seconds on bilateral LE  Forward T assessment:  increased instability noted with RLE  GAIT: Distance walked: 15-20 min Assistive device utilized: None Level of assistance: Complete Independence Comments: Pt reports that when she walks for too long, she starts feel some discomfort on medial aspect of right knee.   TODAY'S TREATMENT:   DATE: 02/22/2023 Recumbent bike level 2 x6 min with PT present to discuss status Seated hamstring stretch 2x20 sec bilat Seated piriformis stretch 2x20 sec bilat Side step lunge/curtsey 2x10 bilat  Leg Press (seat at 6) 70# 2x10  FWD step ups on 6" step with UE support 2x10 bilat Standing quad stretch with foot on chair 2x20 sec bilat Standing rocker board for DF/PF x2 min   02/21/23:Pt arrives for aquatic physical therapy. Treatment took place in 3.5-5.5 feet of water. Water temperature was 91 degrees F. Pt entered the pool via stairs independently with only light use of the rails. Pt requires buoyancy of water for support and to offload joints with strengthening exercises.  Pt utilizes viscosity of the water required for strengthening. Seated water bench with 75% submersion Pt performed seated LE AROM exercises 20x in all planes, ankle fins with LAQ, soft tissue work to RT distal quad bruise as it was getting very hard. Color is improving, concurrent discussion of current status. 75% depth water water with single buoy hand float push/pull and added ankle fins 10x each direction VC to take longer strides. Tandem stance with current provided by PTA 30 sec 3x, then  single limb stance 30 sec with current provided Bil 3x. Hip kicks with ankle fins Bil 20x no UE required for balance. No time for bicycle today.   02/16/23:Pt arrives for aquatic physical therapy. Treatment took place in 3.5-5.5 feet of water. Water temperature was 91 degrees F. Pt entered the pool via stairs independently with only light use of the rails. Pt requires buoyancy of water for support and to offload joints with strengthening exercises.  Pt utilizes viscosity of the water required for strengthening. Seated water bench with 75% submersion Pt performed seated LE AROM exercises 20x in all planes, ankle fins with LAQ,  concurrent discussion of current status. 75% depth water water with natural running arms 10x each direction VC to take longer strides. Single limb stance 30 sec with current provided Bil 3x. Hip kicks with ankle fins Bil 20x no UE required for balance. Push double buoy floats by her side with high knee marching 6 lengths. Step ups 2x15 on first step first set Bil UE, then second set 1 hand only. Bicycle underwater 8 min on horseback mostly LE.  PATIENT EDUCATION:  Education details: Issued HEP Person educated: Patient Education method: Explanation, Demonstration, and Handouts Education comprehension: verbalized understanding and returned demonstration  HOME EXERCISE PROGRAM: Access Code: [email or web address] URL: [email or web address] Date: 01/30/2023 Prepared by: Clydie Braun Raelle Chambers  Exercises - Bridge with Hip Abduction and Resistance - Ground Touches  - 1 x daily - 7 x weekly - 2 sets - 10 reps - Sidelying Reverse Clamshell with Resistance  - 1 x daily - 7 x weekly - 2 sets - 10 reps - Seated Hamstring Stretch  - 1-2 x daily - 7 x weekly - 2 reps - 20 sec hold - Seated Piriformis Stretch with Trunk Bend  - 1 x daily - 7 x weekly - 2 reps - 20 sec hold - Squat with Chair Touch  - 1-2 x daily - 7 x weekly - 2 sets - 10 reps - Side Stepping with  Resistance at Ankles  - 1-2 x daily - 7 x weekly - 2 sets - 10 reps - Forward Monster Walks  - 1-2 x daily - 7 x weekly - 2 sets - 10 reps - Backward Monster Walks  - 1-2 x daily - 7 x weekly - 2 sets - 10 reps - Forward T  - 1-2 x daily - 7 x weekly - 2 sets - 10 reps - Lateral Single Leg Lunge Jumps  - 1 x daily - 7 x weekly - 2 sets - 10 reps - Wall Slide with Posterior Pelvic Tilt  - 1 x daily - 7 x weekly - 1-2 sets - 10 reps - Wall Squat  - 1 x daily - 7 x weekly - 5 reps - 5 sec hold  ASSESSMENT:  CLINICAL IMPRESSION:  Kasie presents to skilled PT for reassessment visit.  Patient reports that she is doing well with the aquatic PT sessions, but had difficulty with last land-based session.  Patient with improved score noted on FOTO, but has not yet met the goal.  Patient has not been able to return to jogging/running at this time secondary to continued pain and has primarily been using the exercise bike.  Patient with difficulty with 90# on leg press, so decreased to 70# this session.  Patient advised to continue HEP provided previously.  Given that patient continues to have increased right knee pain, recommend that patient continue PT once a week for an additional 8 weeks to allow for continued aquatic PT sessions to allow pt to strengthening in a reduced gravity environment.    OBJECTIVE IMPAIRMENTS: decreased balance, difficulty walking, decreased strength, increased muscle spasms, impaired flexibility, and pain.   ACTIVITY LIMITATIONS: squatting, stairs, and running  PARTICIPATION LIMITATIONS: community activity and marathon training  PERSONAL FACTORS: Time since onset of injury/illness/exacerbation and 1 comorbidity: OA  are also affecting patient's functional outcome.   REHAB POTENTIAL: Good  CLINICAL DECISION MAKING: Stable/uncomplicated  EVALUATION COMPLEXITY: Low   GOALS: Goals reviewed with patient? Yes  SHORT TERM GOALS: Target date: 01/19/2023 Pt will be independent  with initial HEP. Baseline: Goal status: MET  2.  Patient will report at least a 25% improvement in symptoms since initial injury. Baseline:  Goal status: MET on 01/25/23   LONG TERM GOALS: Target date: 04/20/2023  Patient will be independent with advanced HEP. Baseline:  Goal status: Ongoing  2.  Pt will increase FOTO to at least 72 to demonstrate improvements in functional status. Baseline:  Goal status: Ongoing  3.  Patient to increase right LE strength to allow her  to complete a Forward T with a level/stable pelvis. Baseline:  Goal status: Ongoing  4.  Patient to report being able to return to marathon training without increased pain. Baseline:  Goal status: Ongoing (as of 02/22/2023, has not been able to resume running)    PLAN:  PT FREQUENCY: 1x/week  PT DURATION: 8 weeks  PLANNED INTERVENTIONS: Therapeutic exercises, Therapeutic activity, Neuromuscular re-education, Balance training, Gait training, Patient/Family education, Self Care, Joint mobilization, Joint manipulation, Stair training, Aquatic Therapy, Dry Needling, Electrical stimulation, Cryotherapy, Moist heat, scar mobilization, Taping, Vasopneumatic device, Ultrasound, Ionotophoresis 4mg /ml Dexamethasone, Manual therapy, and Re-evaluation  PLAN FOR NEXT SESSION: aquatic PT, strengthening, stabilization   Reather Laurence, PT, DPT 02/22/23, 11:58 AM  [facility] [address] [place], [place] [address] Phone # [phone] Fax [phone]

## 2023-02-23 ENCOUNTER — Ambulatory Visit: Payer: BC Managed Care – PPO | Admitting: Physical Therapy

## 2023-03-01 NOTE — Therapy (Deleted)
OUTPATIENT PHYSICAL THERAPY TREATMENT NOTE    Patient Name: Patty [name] MRN: [id number] DOB:1971-04-20, 52 y.o., female Today's Date: 03/01/2023  END OF SESSION:          Past Medical History:  Diagnosis Date   Anxiety    History of recurrent UTIs    Hyperlipidemia    Palpitations    Sinus trouble    Past Surgical History:  Procedure Laterality Date   COLONOSCOPY  15 years ago    in [place]-normal   ENDOMETRIAL ABLATION  10/16/2013   SEPTOPLASTY     1998 and 2000   TURBINATE REDUCTION  08/2021   Patient Active Problem List   Diagnosis Date Noted   Acute meniscal tear of right knee 12/14/2022   Allergic rhinitis 11/28/2022   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   Sleep-disordered breathing 07/01/2021   Statin intolerance 05/23/2021   Costochondritis 01/26/2021   Polyarthralgia 01/26/2021   PAC (premature atrial contraction) 08/12/2020   Mixed hyperlipidemia 07/07/2020   Primary osteoarthritis of both knees 08/21/2017   History of tibial stress fracture 08/16/2015    PCP: Ardith Dark, MD  REFERRING PROVIDER: Monica Becton, MD  REFERRING DIAG: S83.206D (ICD-10-CM) - Acute meniscal tear of right knee, subsequent encounter  THERAPY DIAG:  Other abnormalities of gait and mobility  Muscle weakness (generalized)  Cramp and spasm  Rationale for Evaluation and Treatment: Rehabilitation  ONSET DATE: December 11, 2022  SUBJECTIVE:   SUBJECTIVE STATEMENT:   PERTINENT HISTORY: OA with last Monovisc injection November 2023 PAIN:  Are you having pain? Yes: NPRS scale: 0-2/10 Pain location: right knee (but has history of left knee pain, as well) Pain description: throbbing Aggravating factors: inactivity/prolonged sitting and standing Relieving factors: movement  PRECAUTIONS: None  RED FLAGS: None   WEIGHT BEARING RESTRICTIONS: No  FALLS:  Has patient fallen in last 6 months? 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  Pt has a planned marathon in [place] in December 2024 that she is hoping to be able to complete.  NEXT MD VISIT: Dr Everardo Pacific 01/05/2023, Dr Benjamin Stain once injections approved.  OBJECTIVE:   DIAGNOSTIC FINDINGS:  Right knee MRI on 12/18/2022: IMPRESSION: 1. Large oblique undersurface tear of the posterior horn and body of the medial meniscus, as described. 2. The lateral meniscus, cruciate and collateral ligaments are intact. 3. Mildly progressive tricompartmental degenerative chondrosis with small joint effusion. No acute osseous findings.  PATIENT SURVEYS:  Eval:  FOTO 55 (projected 72 by visit 13)  02/22/2023:  FOTO 67  COGNITION: Overall cognitive status: Within functional limits for tasks assessed     SENSATION: WFL   MUSCLE LENGTH: Hamstrings: right slightly tighter than left LE  POSTURE: No Significant postural limitations   LOWER EXTREMITY ROM:  WFL  LOWER EXTREMITY MMT:  01/02/2023: Right LE strength of 5-/5 grossly throughout Left LE strength is WFL  LOWER EXTREMITY SPECIAL TESTS:  Knee special tests: Anterior drawer test: negative and Posterior drawer test: negative  FUNCTIONAL TESTS:  01/02/2023: 5 times sit to stand: 8.55 sec Single Leg Stance: 30 seconds on bilateral LE  Forward T assessment:  increased instability noted with RLE  GAIT:  Distance walked: 15-20 min Assistive device utilized: None Level of assistance: Complete Independence Comments: Pt reports that when she walks for too long, she starts feel some discomfort on medial aspect of right knee.   TODAY'S TREATMENT:    03/02/23:  DATE: 02/22/2023 Recumbent bike level 2 x6 min with PT present to discuss status Seated hamstring stretch 2x20 sec bilat Seated piriformis stretch 2x20 sec bilat Side step lunge/curtsey 2x10 bilat  Leg Press (seat at 6) 70# 2x10  FWD step ups on 6" step with UE support 2x10 bilat Standing quad stretch with foot on chair 2x20 sec bilat Standing rocker board for DF/PF x2 min   02/21/23:Pt arrives for aquatic physical therapy. Treatment took place in 3.5-5.5 feet of water. Water temperature was 91 degrees F. Pt entered the pool via stairs independently with only light use of the rails. Pt requires buoyancy of water for support and to offload joints with strengthening exercises.  Pt utilizes viscosity of the water required for strengthening. Seated water bench with 75% submersion Pt performed seated LE AROM exercises 20x in all planes, ankle fins with LAQ, soft tissue work to RT distal quad bruise as it was getting very hard. Color is improving, concurrent discussion of current status. 75% depth water water with single buoy hand float push/pull and added ankle fins 10x each direction VC to take longer strides. Tandem stance with current provided by PTA 30 sec 3x, then single limb stance 30 sec with current provided Bil 3x. Hip kicks with ankle fins Bil 20x no UE required for balance. No time for bicycle today.   02/16/23:Pt arrives for aquatic physical therapy. Treatment took place in 3.5-5.5 feet of water. Water temperature was 91 degrees F. Pt entered the pool via stairs independently with only light use of the rails. Pt requires buoyancy of water for support and to offload joints with strengthening exercises.  Pt utilizes viscosity of the water required for strengthening. Seated water bench with 75% submersion Pt performed seated LE AROM exercises 20x in all planes, ankle fins with LAQ,  concurrent discussion of current status. 75% depth water water with natural running arms 10x each  direction VC to take longer strides. Single limb stance 30 sec with current provided Bil 3x. Hip kicks with ankle fins Bil 20x no UE required for balance. Push double buoy floats by her side with high knee marching 6 lengths. Step ups 2x15 on first step first set Bil UE, then second set 1 hand only. Bicycle underwater 8 min on horseback mostly LE.                  PATIENT EDUCATION:  Education details: Issued HEP Person educated: Patient Education method: Explanation, Demonstration, and Handouts Education comprehension: verbalized understanding and returned demonstration  HOME EXERCISE PROGRAM: Access Code: [email or web address] URL: [email or web address] Date: 01/30/2023 Prepared by: Reather Laurence  Exercises - Bridge with Hip Abduction and Resistance - Ground Touches  - 1 x daily - 7 x weekly - 2 sets - 10 reps - Sidelying Reverse Clamshell with Resistance  - 1 x daily - 7 x weekly - 2 sets - 10 reps - Seated Hamstring Stretch  - 1-2 x daily - 7 x weekly - 2 reps - 20 sec hold - Seated Piriformis Stretch with Trunk Bend  - 1 x daily - 7 x weekly - 2 reps - 20 sec hold - Squat with Chair Touch  - 1-2 x daily - 7 x weekly - 2 sets - 10  reps - Side Stepping with Resistance at Ankles  - 1-2 x daily - 7 x weekly - 2 sets - 10 reps - Forward Monster Walks  - 1-2 x daily - 7 x weekly - 2 sets - 10 reps - Backward Monster Walks  - 1-2 x daily - 7 x weekly - 2 sets - 10 reps - Forward T  - 1-2 x daily - 7 x weekly - 2 sets - 10 reps - Lateral Single Leg Lunge Jumps  - 1 x daily - 7 x weekly - 2 sets - 10 reps - Wall Slide with Posterior Pelvic Tilt  - 1 x daily - 7 x weekly - 1-2 sets - 10 reps - Wall Squat  - 1 x daily - 7 x weekly - 5 reps - 5 sec hold  ASSESSMENT:  CLINICAL IMPRESSION:  Patty [name] presents to skilled PT for reassessment visit.  Patient reports that she is doing well with the aquatic PT sessions, but had difficulty with last land-based session.  Patient with improved score  noted on FOTO, but has not yet met the goal.  Patient has not been able to return to jogging/running at this time secondary to continued pain and has primarily been using the exercise bike.  Patient with difficulty with 90# on leg press, so decreased to 70# this session.  Patient advised to continue HEP provided previously.  Given that patient continues to have increased right knee pain, recommend that patient continue PT once a week for an additional 8 weeks to allow for continued aquatic PT sessions to allow pt to strengthening in a reduced gravity environment.    OBJECTIVE IMPAIRMENTS: decreased balance, difficulty walking, decreased strength, increased muscle spasms, impaired flexibility, and pain.   ACTIVITY LIMITATIONS: squatting, stairs, and running  PARTICIPATION LIMITATIONS: community activity and marathon training  PERSONAL FACTORS: Time since onset of injury/illness/exacerbation and 1 comorbidity: OA  are also affecting patient's functional outcome.   REHAB POTENTIAL: Good  CLINICAL DECISION MAKING: Stable/uncomplicated  EVALUATION COMPLEXITY: Low   GOALS: Goals reviewed with patient? Yes  SHORT TERM GOALS: Target date: 01/19/2023 Pt will be independent with initial HEP. Baseline: Goal status: MET  2.  Patient will report at least a 25% improvement in symptoms since initial injury. Baseline:  Goal status: MET on 01/25/23   LONG TERM GOALS: Target date: 04/20/2023  Patient will be independent with advanced HEP. Baseline:  Goal status: Ongoing  2.  Pt will increase FOTO to at least 72 to demonstrate improvements in functional status. Baseline:  Goal status: Ongoing  3.  Patient to increase right LE strength to allow her to complete a Forward T with a level/stable pelvis. Baseline:  Goal status: Ongoing  4.  Patient to report being able to return to marathon training without increased pain. Baseline:  Goal status: Ongoing (as of 02/22/2023, has not been able to  resume running)    PLAN:  PT FREQUENCY: 1x/week  PT DURATION: 8 weeks  PLANNED INTERVENTIONS: Therapeutic exercises, Therapeutic activity, Neuromuscular re-education, Balance training, Gait training, Patient/Family education, Self Care, Joint mobilization, Joint manipulation, Stair training, Aquatic Therapy, Dry Needling, Electrical stimulation, Cryotherapy, Moist heat, scar mobilization, Taping, Vasopneumatic device, Ultrasound, Ionotophoresis 4mg /ml Dexamethasone, Manual therapy, and Re-evaluation  PLAN FOR NEXT SESSION: aquatic PT, strengthening, stabilization  Ane Payment, PTA 03/01/23 7:10 PM     [facility] [address] [place], [place] [address] Phone # [phone] Fax [phone]

## 2023-03-02 ENCOUNTER — Ambulatory Visit: Payer: BC Managed Care – PPO | Admitting: Physical Therapy

## 2023-03-02 DIAGNOSIS — R252 Cramp and spasm: Secondary | ICD-10-CM

## 2023-03-02 DIAGNOSIS — M6281 Muscle weakness (generalized): Secondary | ICD-10-CM

## 2023-03-02 DIAGNOSIS — R2689 Other abnormalities of gait and mobility: Secondary | ICD-10-CM

## 2023-03-07 ENCOUNTER — Ambulatory Visit: Payer: BC Managed Care – PPO | Attending: Sports Medicine | Admitting: Physical Therapy

## 2023-03-07 ENCOUNTER — Encounter: Payer: Self-pay | Admitting: Physical Therapy

## 2023-03-07 ENCOUNTER — Ambulatory Visit: Payer: BC Managed Care – PPO | Admitting: Physical Therapy

## 2023-03-07 DIAGNOSIS — R252 Cramp and spasm: Secondary | ICD-10-CM | POA: Insufficient documentation

## 2023-03-07 DIAGNOSIS — R2689 Other abnormalities of gait and mobility: Secondary | ICD-10-CM | POA: Diagnosis not present

## 2023-03-07 DIAGNOSIS — M6281 Muscle weakness (generalized): Secondary | ICD-10-CM | POA: Insufficient documentation

## 2023-03-07 NOTE — Therapy (Signed)
OUTPATIENT PHYSICAL THERAPY TREATMENT NOTE    Patient Name: Patty [name] MRN: [id number] DOB:1971-06-05, 52 y.o., female Today's Date: 03/07/2023  END OF SESSION:  PT End of Session - 03/07/23 2151     Visit Number 12    Date for PT Re-Evaluation 04/20/23    Authorization Type BC/BS    PT Start Time 1015    PT Stop Time 1100    PT Time Calculation (min) 45 min    Activity Tolerance Patient tolerated treatment well    Behavior During Therapy WFL for tasks assessed/performed                   Past Medical History:  Diagnosis Date   Anxiety    History of recurrent UTIs    Hyperlipidemia    Palpitations    Sinus trouble    Past Surgical History:  Procedure Laterality Date   COLONOSCOPY  15 years ago    in [place]-normal   ENDOMETRIAL ABLATION  10/16/2013   SEPTOPLASTY     1998 and 2000   TURBINATE REDUCTION  08/2021   Patient Active Problem List   Diagnosis Date Noted   Acute meniscal tear of right knee 12/14/2022   Allergic rhinitis 11/28/2022   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   Sleep-disordered breathing 07/01/2021   Statin intolerance 05/23/2021   Costochondritis 01/26/2021   Polyarthralgia 01/26/2021   PAC (premature atrial contraction) 08/12/2020   Mixed hyperlipidemia 07/07/2020   Primary osteoarthritis of both knees 08/21/2017   History of tibial stress fracture 08/16/2015    PCP: Ardith Dark, MD  REFERRING PROVIDER: Monica Becton, MD  REFERRING DIAG: S83.206D (ICD-10-CM) - Acute meniscal tear of right knee, subsequent encounter  THERAPY DIAG:  Other abnormalities of gait and mobility  Muscle weakness (generalized)  Cramp and spasm  Rationale for Evaluation and Treatment: Rehabilitation  ONSET DATE: December 11, 2022  SUBJECTIVE:   SUBJECTIVE STATEMENT: I feel a tight, thickness at end of my thigh. Still tender around my knee. I have done no exercise.  PERTINENT HISTORY: OA with last Monovisc injection November  2023 PAIN:  Are you having pain? Yes: NPRS scale: 0-2/10 Pain location: right knee (but has history of left knee pain, as well) Pain description: throbbing Aggravating factors: inactivity/prolonged sitting and standing Relieving factors: movement  PRECAUTIONS: None  RED FLAGS: None   WEIGHT BEARING RESTRICTIONS: No  FALLS:  Has patient fallen in last 6 months? 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  Pt has a planned marathon in [place] in December 2024 that she is hoping to be able to complete.  NEXT MD VISIT: Dr Everardo Pacific 01/05/2023, Dr Benjamin Stain once injections approved.  OBJECTIVE:   DIAGNOSTIC FINDINGS:  Right knee MRI on 12/18/2022: IMPRESSION: 1. Large oblique undersurface tear of the posterior horn and body of the medial meniscus, as described. 2. The lateral meniscus, cruciate and collateral ligaments are intact. 3. Mildly progressive tricompartmental degenerative chondrosis with small joint effusion. No acute osseous findings.  PATIENT SURVEYS:  Eval:  FOTO 55 (projected 72 by visit 13)  02/22/2023:  FOTO 67  COGNITION: Overall cognitive status: Within functional limits for tasks  assessed     SENSATION: WFL   MUSCLE LENGTH: Hamstrings: right slightly tighter than left LE  POSTURE: No Significant postural limitations   LOWER EXTREMITY ROM:  WFL  LOWER EXTREMITY MMT:  01/02/2023: Right LE strength of 5-/5 grossly throughout Left LE strength is WFL  LOWER EXTREMITY SPECIAL TESTS:  Knee special tests: Anterior drawer test: negative and Posterior drawer test:  negative  FUNCTIONAL TESTS:  01/02/2023: 5 times sit to stand: 8.55 sec Single Leg Stance: 30 seconds on bilateral LE  Forward T assessment:  increased instability noted with RLE  GAIT: Distance walked: 15-20 min Assistive device utilized: None Level of assistance: Complete Independence Comments: Pt reports that when she walks for too long, she starts feel some discomfort on medial aspect of right knee.   TODAY'S TREATMENT:   03/07/23:Pt arrives for aquatic physical therapy. Treatment took place in 3.5-5.5 feet of water. Water temperature was 91 degrees F. Pt entered the pool via stairs independently with only light use of the rails. Pt requires buoyancy of water for support and to offload joints with strengthening exercises.  Pt utilizes viscosity of the water required for strengthening. Seated water bench with 75% submersion Pt performed seated LE AROM exercises 20x in all planes, ankle fins with LAQ, soft tissue work to RT distal quad bruise abolished but thickness to distal quad, concurrent discussion of current status. 75% depth water water with single buoy hand float push/pull and added ankle fins 10x each direction VC to take longer strides. 4 lengths of mini jog, being on her toes and propelling off. Hip kicks with ankle fins Bil 20x no UE required for balance. Underwater bicycle 8 min on yellow noodle in horseback.   DATE: 02/22/2023 Recumbent bike level 2 x6 min with PT present to discuss status Seated hamstring stretch 2x20 sec bilat Seated piriformis stretch 2x20 sec bilat Side step lunge/curtsey 2x10 bilat  Leg Press (seat at 6) 70# 2x10  FWD step ups on 6" step with UE support 2x10 bilat Standing quad stretch with foot on chair 2x20 sec bilat Standing rocker board for DF/PF x2 min   02/21/23:Pt arrives for aquatic physical therapy. Treatment took place in 3.5-5.5 feet of water. Water temperature was 91 degrees F. Pt entered the pool via stairs independently with only light  use of the rails. Pt requires buoyancy of water for support and to offload joints with strengthening exercises.  Pt utilizes viscosity of the water required for strengthening. Seated water bench with 75% submersion Pt performed seated LE AROM exercises 20x in all planes, ankle fins with LAQ, soft tissue work to RT distal quad bruise as it was getting very hard. Color is improving, concurrent discussion of current status. 75% depth water water with single buoy hand float push/pull and added ankle fins 10x each direction VC to take longer strides. Tandem stance with current provided by PTA 30 sec 3x, then single limb stance 30 sec with current provided Bil 3x. Hip kicks with ankle fins Bil 20x no UE required for balance. No time for bicycle today.        PATIENT EDUCATION:  Education details: Issued HEP Person educated: Patient Education method: Explanation, Demonstration, and Handouts Education comprehension: verbalized understanding and returned demonstration  HOME EXERCISE PROGRAM: Access Code: [email or web address] URL: [email or web address] Date: 01/30/2023 Prepared by: Reather Laurence  Exercises - Bridge with Hip Abduction and Resistance - Ground Touches  - 1 x daily - 7 x weekly - 2 sets - 10 reps - Sidelying Reverse  Clamshell with Resistance  - 1 x daily - 7 x weekly - 2 sets - 10 reps - Seated Hamstring Stretch  - 1-2 x daily - 7 x weekly - 2 reps - 20 sec hold - Seated Piriformis Stretch with Trunk Bend  - 1 x daily - 7 x weekly - 2 reps - 20 sec hold - Squat with Chair Touch  - 1-2 x daily - 7 x weekly - 2 sets - 10 reps - Side Stepping with Resistance at Ankles  - 1-2 x daily - 7 x weekly - 2 sets - 10 reps - Forward Monster Walks  - 1-2 x daily - 7 x weekly - 2 sets - 10 reps - Backward Monster Walks  - 1-2 x daily - 7 x weekly - 2 sets - 10 reps - Forward T  - 1-2 x daily - 7 x weekly - 2 sets - 10 reps - Lateral Single Leg Lunge Jumps  - 1 x daily - 7 x weekly - 2 sets - 10  reps - Wall Slide with Posterior Pelvic Tilt  - 1 x daily - 7 x weekly - 1-2 sets - 10 reps - Wall Squat  - 1 x daily - 7 x weekly - 5 reps - 5 sec hold  ASSESSMENT:  CLINICAL IMPRESSION: Pt presents for aquatic Pt today with minimal knee pain. Distal quad remains thick, VC to continue massaging daily. Began a light propulsion off her toes which was painfree in the water. PTA advised pt to ride her bike trainer 10 min/day 2x day.as she was not doing any other HEP.  OBJECTIVE IMPAIRMENTS: decreased balance, difficulty walking, decreased strength, increased muscle spasms, impaired flexibility, and pain.   ACTIVITY LIMITATIONS: squatting, stairs, and running  PARTICIPATION LIMITATIONS: community activity and marathon training  PERSONAL FACTORS: Time since onset of injury/illness/exacerbation and 1 comorbidity: OA  are also affecting patient's functional outcome.   REHAB POTENTIAL: Good  CLINICAL DECISION MAKING: Stable/uncomplicated  EVALUATION COMPLEXITY: Low   GOALS: Goals reviewed with patient? Yes  SHORT TERM GOALS: Target date: 01/19/2023 Pt will be independent with initial HEP. Baseline: Goal status: MET  2.  Patient will report at least a 25% improvement in symptoms since initial injury. Baseline:  Goal status: MET on 01/25/23   LONG TERM GOALS: Target date: 04/20/2023  Patient will be independent with advanced HEP. Baseline:  Goal status: Ongoing  2.  Pt will increase FOTO to at least 72 to demonstrate improvements in functional status. Baseline:  Goal status: Ongoing  3.  Patient to increase right LE strength to allow her to complete a Forward T with a level/stable pelvis. Baseline:  Goal status: Ongoing  4.  Patient to report being able to return to marathon training without increased pain. Baseline:  Goal status: Ongoing (as of 02/22/2023, has not been able to resume running)    PLAN:  PT FREQUENCY: 1x/week  PT DURATION: 8 weeks  PLANNED  INTERVENTIONS: Therapeutic exercises, Therapeutic activity, Neuromuscular re-education, Balance training, Gait training, Patient/Family education, Self Care, Joint mobilization, Joint manipulation, Stair training, Aquatic Therapy, Dry Needling, Electrical stimulation, Cryotherapy, Moist heat, scar mobilization, Taping, Vasopneumatic device, Ultrasound, Ionotophoresis 4mg /ml Dexamethasone, Manual therapy, and Re-evaluation  PLAN FOR NEXT SESSION: aquatic PT, strengthening, stabilization   Ane Payment, PTA 03/07/23 9:53 PM  [facility] [address], [address] [place], [place] [address] Phone # [phone] Fax [phone]

## 2023-03-14 ENCOUNTER — Encounter: Payer: Self-pay | Admitting: Physical Therapy

## 2023-03-14 ENCOUNTER — Ambulatory Visit: Payer: BC Managed Care – PPO | Admitting: Physical Therapy

## 2023-03-14 DIAGNOSIS — M6281 Muscle weakness (generalized): Secondary | ICD-10-CM

## 2023-03-14 DIAGNOSIS — R252 Cramp and spasm: Secondary | ICD-10-CM | POA: Diagnosis not present

## 2023-03-14 DIAGNOSIS — R2689 Other abnormalities of gait and mobility: Secondary | ICD-10-CM | POA: Diagnosis not present

## 2023-03-14 NOTE — Therapy (Signed)
OUTPATIENT PHYSICAL THERAPY TREATMENT NOTE    Patient Name: Patty [name] MRN: [id number] DOB:[birth year], 52 y.o., female Today's Date: 03/14/2023  END OF SESSION:  PT End of Session - 03/14/23 1146     Visit Number 13    Date for PT Re-Evaluation 04/20/23    Authorization Type BC/BS    PT Start Time 0845    PT Stop Time 0930    PT Time Calculation (min) 45 min    Activity Tolerance Patient tolerated treatment well    Behavior During Therapy [facility] for tasks assessed/performed                   Past Medical History:  Diagnosis Date   Anxiety    History of recurrent UTIs    Hyperlipidemia    Palpitations    Sinus trouble    Past Surgical History:  Procedure Laterality Date   COLONOSCOPY  15 years ago    in [place]-normal   ENDOMETRIAL ABLATION  10/16/2013   SEPTOPLASTY     1998 and 2000   TURBINATE REDUCTION  08/2021   Patient Active Problem List   Diagnosis Date Noted   Acute meniscal tear of right knee 12/14/2022   Allergic rhinitis 11/28/2022   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   Sleep-disordered breathing 07/01/2021   Statin intolerance 05/23/2021   Costochondritis 01/26/2021   Polyarthralgia 01/26/2021   PAC (premature atrial contraction) 08/12/2020   Mixed hyperlipidemia 07/07/2020   Primary osteoarthritis of both knees 08/21/2017   History of tibial stress fracture 08/16/2015    PCP: Ardith Dark, MD  REFERRING PROVIDER: Monica Becton, MD  REFERRING DIAG: S83.206D (ICD-10-CM) - Acute meniscal tear of right knee, subsequent encounter  THERAPY DIAG:  Other abnormalities of gait and mobility  Muscle weakness (generalized)  Cramp and spasm  Rationale for Evaluation and Treatment: Rehabilitation  ONSET DATE: December 11, 2022  SUBJECTIVE:   SUBJECTIVE STATEMENT: I have had 2 days of no knee pain. I am cycling on the bicycle 10 min day and this is going well. I think that fall really set me back more than I realized.  Marland Kitchen  PERTINENT HISTORY: OA with last Monovisc injection November 2023 PAIN:  Are you having pain? Not right now.  PRECAUTIONS: None  RED FLAGS: None   WEIGHT BEARING RESTRICTIONS: No  FALLS:  Has patient fallen in last 6 months? 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  Pt has a planned marathon in [place] in December 2024 that she is hoping to be able to complete.  NEXT MD VISIT: Dr Everardo Pacific 01/05/2023, Dr Benjamin Stain once injections approved.  OBJECTIVE:   DIAGNOSTIC FINDINGS:  Right knee MRI on 12/18/2022: IMPRESSION: 1. Large oblique undersurface tear of the posterior horn and body of the medial meniscus, as described. 2. The lateral meniscus, cruciate and collateral ligaments are intact. 3. Mildly progressive tricompartmental degenerative chondrosis with small joint effusion. No acute osseous findings.  PATIENT SURVEYS:  Eval:  FOTO 55 (projected 72 by visit 13)  02/22/2023:  FOTO 67  COGNITION: Overall cognitive status: Within functional limits for tasks assessed     SENSATION: [facility]   MUSCLE  LENGTH: Hamstrings: right slightly tighter than left LE  POSTURE: No Significant postural limitations   LOWER EXTREMITY ROM:  WFL  LOWER EXTREMITY MMT:  01/02/2023: Right LE strength of 5-/5 grossly throughout Left LE strength is WFL  LOWER EXTREMITY SPECIAL TESTS:  Knee special tests: Anterior drawer test: negative and Posterior drawer test: negative  FUNCTIONAL TESTS:  01/02/2023: 5 times sit to stand: 8.55 sec Single Leg Stance: 30 seconds on bilateral LE  Forward T assessment:   increased instability noted with RLE  GAIT: Distance walked: 15-20 min Assistive device utilized: None Level of assistance: Complete Independence Comments: Pt reports that when she walks for too long, she starts feel some discomfort on medial aspect of right knee.   TODAY'S TREATMENT:   03/14/23:Pt arrives for aquatic physical therapy. Treatment took place in 3.5-5.5 feet of water. Water temperature was 91 degrees F. Pt entered the pool via stairs independently with only light use of the rails. Pt requires buoyancy of water for support and to offload joints with strengthening exercises.  Pt utilizes viscosity of the water required for strengthening. Seated water bench with 75% submersion Pt performed seated LE AROM exercises 20x in all planes, ankle fins with LAQ, soft tissue work to RT distal quad bruise abolished much less thickness to distal quad, concurrent discussion of current status. 75% depth water walking with single buoy hand float push/pull and added ankle fins 10x each direction VC to increase speed. 4 lengths of mini jog, being on her toes and propelling off. Hip kicks with ankle fins Bil 20x no UE required for balance. Teal noodle RTLE knee extension posu downs 2x10. Underwater bicycle 10 min on yellow noodle in horseback.   03/07/23:Pt arrives for aquatic physical therapy. Treatment took place in 3.5-5.5 feet of water. Water temperature was 91 degrees F. Pt entered the pool via stairs independently with only light use of the rails. Pt requires buoyancy of water for support and to offload joints with strengthening exercises.  Pt utilizes viscosity of the water required for strengthening. Seated water bench with 75% submersion Pt performed seated LE AROM exercises 20x in all planes, ankle fins with LAQ, soft tissue work to RT distal quad bruise abolished but thickness to distal quad, concurrent discussion of current status. 75% depth water water with single buoy hand float push/pull and  added ankle fins 10x each direction VC to take longer strides. 4 lengths of mini jog, being on her toes and propelling off. Hip kicks with ankle fins Bil 20x no UE required for balance. Underwater bicycle 8 min on yellow noodle in horseback.       PATIENT EDUCATION:  Education details: Issued HEP Person educated: Patient Education method: Explanation, Demonstration, and Handouts Education comprehension: verbalized understanding and returned demonstration9/18/24:Pt arrives for aquatic physical therapy. Treatment took place in 3.5-5.5 feet of water. Water temperature was 91 degrees F. Pt entered the pool via stairs independently with only light use of the rails. Pt requires buoyancy of water for support and to offload joints with strengthening exercises.  Pt utilizes viscosity of the water required for strengthening. Seated water bench with 75% submersion Pt performed seated LE AROM exercises 20x in all planes, ankle fins with LAQ, soft tissue work to RT distal quad bruise as it was getting very hard. Color is improving, concurrent discussion of current status. 75% depth water water wit  HOME EXERCISE PROGRAM: Access Code: [email or web address] URL: [email or web address] Date: 01/30/2023 Prepared by: Reather Laurence  Exercises - Bridge with Hip Abduction  and Resistance - Ground Touches  - 1 x daily - 7 x weekly - 2 sets - 10 reps - Sidelying Reverse Clamshell with Resistance  - 1 x daily - 7 x weekly - 2 sets - 10 reps - Seated Hamstring Stretch  - 1-2 x daily - 7 x weekly - 2 reps - 20 sec hold - Seated Piriformis Stretch with Trunk Bend  - 1 x daily - 7 x weekly - 2 reps - 20 sec hold - Squat with Chair Touch  - 1-2 x daily - 7 x weekly - 2 sets - 10 reps - Side Stepping with Resistance at Ankles  - 1-2 x daily - 7 x weekly - 2 sets - 10 reps - Forward Monster Walks  - 1-2 x daily - 7 x weekly - 2 sets - 10 reps - Backward Monster Walks  - 1-2 x daily - 7 x weekly - 2 sets - 10 reps -  Forward T  - 1-2 x daily - 7 x weekly - 2 sets - 10 reps - Lateral Single Leg Lunge Jumps  - 1 x daily - 7 x weekly - 2 sets - 10 reps - Wall Slide with Posterior Pelvic Tilt  - 1 x daily - 7 x weekly - 1-2 sets - 10 reps - Wall Squat  - 1 x daily - 7 x weekly - 5 reps - 5 sec hold  ASSESSMENT:  CLINICAL IMPRESSION: Pt reports 2 full days of pain freeness. She is cycling 10 min on most days. Distal quad free of thickness post bruise. Pt feeling more positive abouther recovery.  OBJECTIVE IMPAIRMENTS: decreased balance, difficulty walking, decreased strength, increased muscle spasms, impaired flexibility, and pain.   ACTIVITY LIMITATIONS: squatting, stairs, and running  PARTICIPATION LIMITATIONS: community activity and marathon training  PERSONAL FACTORS: Time since onset of injury/illness/exacerbation and 1 comorbidity: OA  are also affecting patient's functional outcome.   REHAB POTENTIAL: Good  CLINICAL DECISION MAKING: Stable/uncomplicated  EVALUATION COMPLEXITY: Low   GOALS: Goals reviewed with patient? Yes  SHORT TERM GOALS: Target date: 01/19/2023 Pt will be independent with initial HEP. Baseline: Goal status: MET  2.  Patient will report at least a 25% improvement in symptoms since initial injury. Baseline:  Goal status: MET on 01/25/23   LONG TERM GOALS: Target date: 04/20/2023  Patient will be independent with advanced HEP. Baseline:  Goal status: Ongoing  2.  Pt will increase FOTO to at least 72 to demonstrate improvements in functional status. Baseline:  Goal status: Ongoing  3.  Patient to increase right LE strength to allow her to complete a Forward T with a level/stable pelvis. Baseline:  Goal status: Ongoing  4.  Patient to report being able to return to marathon training without increased pain. Baseline:  Goal status: Ongoing (as of 02/22/2023, has not been able to resume running)    PLAN:  PT FREQUENCY: 1x/week  PT DURATION: 8  weeks  PLANNED INTERVENTIONS: Therapeutic exercises, Therapeutic activity, Neuromuscular re-education, Balance training, Gait training, Patient/Family education, Self Care, Joint mobilization, Joint manipulation, Stair training, Aquatic Therapy, Dry Needling, Electrical stimulation, Cryotherapy, Moist heat, scar mobilization, Taping, Vasopneumatic device, Ultrasound, Ionotophoresis 4mg /ml Dexamethasone, Manual therapy, and Re-evaluation  PLAN FOR NEXT SESSION: aquatic PT, strengthening, stabilization   Ane Payment, PTA 03/14/23 11:47 AM   [facility] [address] [place], [place] [address] Phone # [phone] Fax [phone]

## 2023-03-21 ENCOUNTER — Ambulatory Visit: Payer: BC Managed Care – PPO | Admitting: Physical Therapy

## 2023-03-30 ENCOUNTER — Encounter: Payer: Self-pay | Admitting: Physical Therapy

## 2023-03-30 ENCOUNTER — Ambulatory Visit: Payer: BC Managed Care – PPO | Admitting: Physical Therapy

## 2023-03-30 DIAGNOSIS — R2689 Other abnormalities of gait and mobility: Secondary | ICD-10-CM | POA: Diagnosis not present

## 2023-03-30 DIAGNOSIS — R252 Cramp and spasm: Secondary | ICD-10-CM

## 2023-03-30 DIAGNOSIS — M6281 Muscle weakness (generalized): Secondary | ICD-10-CM | POA: Diagnosis not present

## 2023-03-30 NOTE — Therapy (Addendum)
OUTPATIENT PHYSICAL THERAPY TREATMENT NOTE AND LATE ENTRY DISCHARGE SUMMARY   Patient Name: Patty [name] MRN: [id number] DOB:1971-03-19, 52 y.o., female Today's Date: 03/30/2023  END OF SESSION:  PT End of Session - 03/30/23 1212     Visit Number 14    Date for PT Re-Evaluation 04/20/23    Authorization Type BC/BS    PT Start Time 1212    PT Stop Time 1300    PT Time Calculation (min) 48 min    Activity Tolerance Patient tolerated treatment well    Behavior During Therapy WFL for tasks assessed/performed                    Past Medical History:  Diagnosis Date   Anxiety    History of recurrent UTIs    Hyperlipidemia    Palpitations    Sinus trouble    Past Surgical History:  Procedure Laterality Date   COLONOSCOPY  15 years ago    in [place]-normal   ENDOMETRIAL ABLATION  10/16/2013   SEPTOPLASTY     1998 and 2000   TURBINATE REDUCTION  08/2021   Patient Active Problem List   Diagnosis Date Noted   Acute meniscal tear of right knee 12/14/2022   Allergic rhinitis 11/28/2022   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   Sleep-disordered breathing 07/01/2021   Statin intolerance 05/23/2021   Costochondritis 01/26/2021   Polyarthralgia 01/26/2021   PAC (premature atrial contraction) 08/12/2020   Mixed hyperlipidemia 07/07/2020   Primary osteoarthritis of both knees 08/21/2017   History of tibial stress fracture 08/16/2015    PCP: Ardith Dark, MD  REFERRING PROVIDER: Monica Becton, MD  REFERRING DIAG: S83.206D (ICD-10-CM) - Acute meniscal tear of right knee, subsequent encounter  THERAPY DIAG:  Other abnormalities of gait and mobility  Muscle weakness (generalized)  Cramp and spasm  Rationale for Evaluation and Treatment: Rehabilitation  ONSET DATE: December 11, 2022  SUBJECTIVE:   SUBJECTIVE STATEMENT:.I am cycling at least 3x week and it is going well. Pain staying low in the knee. Pt only cycling and not doing any strengthening  exercises. Pt will be traveling to [place] to see a specialist in 2 weeks. [facility] and to do aqua jogging.  PERTINENT HISTORY: OA with last Monovisc injection November 2023 PAIN:  Are you having pain? Not right now.  PRECAUTIONS: None  RED FLAGS: None   WEIGHT BEARING RESTRICTIONS: No  FALLS:  Has patient fallen in last 6 months? 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  Pt has a planned marathon in [place] in December 2024 that she is hoping to be able to complete.  NEXT MD VISIT: Dr Everardo Pacific 01/05/2023, Dr Benjamin Stain once injections approved.  OBJECTIVE:   DIAGNOSTIC FINDINGS:  Right knee MRI on 12/18/2022: IMPRESSION: 1. Large oblique undersurface tear of the posterior horn and body of the medial meniscus, as described. 2. The lateral meniscus, cruciate and collateral ligaments are intact. 3. Mildly progressive tricompartmental degenerative chondrosis with small joint effusion. No acute osseous findings.  PATIENT SURVEYS:  Eval:  FOTO 55 (projected 72 by visit 13)  02/22/2023:  FOTO 67  COGNITION: Overall cognitive status: Within functional limits for tasks assessed     SENSATION: WFL   MUSCLE LENGTH: Hamstrings: right slightly tighter than left LE  POSTURE: No Significant postural limitations   LOWER EXTREMITY ROM:  WFL  LOWER EXTREMITY MMT:  01/02/2023: Right LE strength of 5-/5 grossly throughout Left LE strength is WFL  LOWER EXTREMITY SPECIAL TESTS:  Knee special tests: Anterior drawer test: negative and Posterior drawer test:  negative  FUNCTIONAL TESTS:  01/02/2023: 5 times sit to stand: 8.55 sec Single Leg Stance: 30 seconds on bilateral LE  Forward T assessment:  increased instability noted with RLE  GAIT: Distance walked: 15-20 min Assistive device utilized: None Level of assistance: Complete Independence Comments: Pt reports that when she walks for too long, she starts feel some discomfort on medial aspect of right knee.   TODAY'S TREATMENT:   03/30/23:Pt arrives for aquatic physical therapy. Treatment took place in 3.5-5.5 feet of water. Water temperature was 91 degrees F. Pt entered the pool via stairs independently with only light use of the rails. Pt requires buoyancy of water for support and to offload joints with strengthening exercises.  Pt utilizes viscosity of the water required for strengthening. Seated water bench with 75% submersion Pt performed seated LE AROM exercises 20x in all planes, ankle fins with LAQ, soft tissue work to RT distal quad bruise abolished much less thickness to distal quad, concurrent discussion of current status. 75% depth water walking with single buoy hand float push/pull and added ankle fins 10x each direction VC to increase speed. 4 lengths of mini jog, being on her toes and propelling off. Hip kicks with ankle fins Bil 20x no UE required for balance. Teal noodle RTLE knee extension posu downs 2x10. Underwater bicycle 10 min on yellow noodle in horseback.  03/14/23:Pt arrives for aquatic physical therapy. Treatment took place in 3.5-5.5 feet of water. Water temperature was 91 degrees F. Pt entered the pool via stairs independently with only light use of the rails. Pt requires buoyancy of water for support and to offload joints with strengthening exercises.  Pt utilizes viscosity of the water required for strengthening. Seated water bench with 75% submersion Pt performed seated LE AROM exercises 20x in all planes, ankle fins with LAQ, soft tissue work to RT distal quad bruise  abolished much less thickness to distal quad, concurrent discussion of current status. 75% depth water walking with single buoy hand float push/pull and added ankle fins 10x each direction VC to increase speed. 4 lengths of mini jog, being on her toes and propelling off. Hip kicks with ankle fins Bil 20x no UE required for balance. Teal noodle RTLE knee extension posu downs 2x10. Underwater bicycle 10 min on yellow noodle in horseback.       PATIENT EDUCATION:  Education details: Issued HEP Person educated: Patient Education method: Explanation, Demonstration, and Handouts Education comprehension: verbalized understanding and returned demonstration9/18/24:Pt arrives for aquatic physical therapy. Treatment took place in 3.5-5.5 feet of water. Water temperature was 91 degrees F. Pt entered the pool via stairs independently with only light use of the rails. Pt requires buoyancy of water for support and to offload joints with strengthening exercises.  Pt utilizes viscosity of the water required for strengthening. Seated water bench with 75% submersion Pt performed seated LE AROM exercises 20x in all planes, ankle fins with LAQ, soft tissue work to RT distal quad bruise as it was getting very hard. Color is improving, concurrent  discussion of current status. 75% depth water water wit  HOME EXERCISE PROGRAM: Access Code: [email or web address] URL: [email or web address] Date: 01/30/2023 Prepared by: Reather Laurence  Exercises - Bridge with Hip Abduction and Resistance - Ground Touches  - 1 x daily - 7 x weekly - 2 sets - 10 reps - Sidelying Reverse Clamshell with Resistance  - 1 x daily - 7 x weekly - 2 sets - 10 reps - Seated Hamstring Stretch  - 1-2 x daily - 7 x weekly - 2 reps - 20 sec hold - Seated Piriformis Stretch with Trunk Bend  - 1 x daily - 7 x weekly - 2 reps - 20 sec hold - Squat with Chair Touch  - 1-2 x daily - 7 x weekly - 2 sets - 10 reps - Side Stepping with Resistance at Ankles   - 1-2 x daily - 7 x weekly - 2 sets - 10 reps - Forward Monster Walks  - 1-2 x daily - 7 x weekly - 2 sets - 10 reps - Backward Monster Walks  - 1-2 x daily - 7 x weekly - 2 sets - 10 reps - Forward T  - 1-2 x daily - 7 x weekly - 2 sets - 10 reps - Lateral Single Leg Lunge Jumps  - 1 x daily - 7 x weekly - 2 sets - 10 reps - Wall Slide with Posterior Pelvic Tilt  - 1 x daily - 7 x weekly - 1-2 sets - 10 reps - Wall Squat  - 1 x daily - 7 x weekly - 5 reps - 5 sec hold  ASSESSMENT:  CLINICAL IMPRESSION: Pt will join [employer] to continue with aquatic exercise including aqua jogging. Pt has resumed cycling at least 3x week and has not had any exacerbations.pt has one more aquatic session next week.  OBJECTIVE IMPAIRMENTS: decreased balance, difficulty walking, decreased strength, increased muscle spasms, impaired flexibility, and pain.   ACTIVITY LIMITATIONS: squatting, stairs, and running  PARTICIPATION LIMITATIONS: community activity and marathon training  PERSONAL FACTORS: Time since onset of injury/illness/exacerbation and 1 comorbidity: OA  are also affecting patient's functional outcome.   REHAB POTENTIAL: Good  CLINICAL DECISION MAKING: Stable/uncomplicated  EVALUATION COMPLEXITY: Low   GOALS: Goals reviewed with patient? Yes  SHORT TERM GOALS: Target date: 01/19/2023 Pt will be independent with initial HEP. Baseline: Goal status: MET  2.  Patient will report at least a 25% improvement in symptoms since initial injury. Baseline:  Goal status: MET on 01/25/23   LONG TERM GOALS: Target date: 04/20/2023  Patient will be independent with advanced HEP. Baseline:  Goal status: Ongoing  2.  Pt will increase FOTO to at least 72 to demonstrate improvements in functional status. Baseline:  Goal status: Ongoing  3.  Patient to increase right LE strength to allow her to complete a Forward T with a level/stable pelvis. Baseline:  Goal status: Ongoing  4.  Patient to report  being able to return to marathon training without increased pain. Baseline:  Goal status: Ongoing (as of 02/22/2023, has not been able to resume running)    PLAN:  PT FREQUENCY: 1x/week  PT DURATION: 8 weeks  PLANNED INTERVENTIONS: Therapeutic exercises, Therapeutic activity, Neuromuscular re-education, Balance training, Gait training, Patient/Family education, Self Care, Joint mobilization, Joint manipulation, Stair training, Aquatic Therapy, Dry Needling, Electrical stimulation, Cryotherapy, Moist heat, scar mobilization, Taping, Vasopneumatic device, Ultrasound, Ionotophoresis 4mg /ml Dexamethasone, Manual therapy, and Re-evaluation  PLAN FOR NEXT SESSION: DC aquatics next week.   Ane Payment,  PTA 03/30/23 2:02 PM   [facility] [address] [place], [place] [address] Phone # [phone] Fax [phone]   PHYSICAL THERAPY DISCHARGE SUMMARY  Received communication on 04/11/2023 that patient wishes to discharge from PT.  States that she will continue exercise independently.  Patient agrees to discharge. Patient goals were partially met. Patient is being discharged due to the patient's request.  Reather Laurence, PT, DPT 04/11/23, 12:01 PM

## 2023-04-05 DIAGNOSIS — M25561 Pain in right knee: Secondary | ICD-10-CM | POA: Diagnosis not present

## 2023-04-05 DIAGNOSIS — M23221 Derangement of posterior horn of medial meniscus due to old tear or injury, right knee: Secondary | ICD-10-CM | POA: Diagnosis not present

## 2023-04-05 DIAGNOSIS — M25462 Effusion, left knee: Secondary | ICD-10-CM | POA: Diagnosis not present

## 2023-04-05 DIAGNOSIS — M9906 Segmental and somatic dysfunction of lower extremity: Secondary | ICD-10-CM | POA: Diagnosis not present

## 2023-04-05 DIAGNOSIS — M25461 Effusion, right knee: Secondary | ICD-10-CM | POA: Diagnosis not present

## 2023-04-06 ENCOUNTER — Ambulatory Visit: Payer: BC Managed Care – PPO | Admitting: Physical Therapy

## 2023-04-13 ENCOUNTER — Ambulatory Visit: Payer: BC Managed Care – PPO | Admitting: Physical Therapy

## 2023-04-20 ENCOUNTER — Ambulatory Visit: Payer: BC Managed Care – PPO | Admitting: Rehabilitative and Restorative Service Providers"

## 2023-04-27 DIAGNOSIS — Z1231 Encounter for screening mammogram for malignant neoplasm of breast: Secondary | ICD-10-CM | POA: Diagnosis not present

## 2023-11-15 ENCOUNTER — Other Ambulatory Visit: Payer: Self-pay | Admitting: Internal Medicine

## 2023-11-30 NOTE — Progress Notes (Signed)
 Cardiology Office Note   Date:  12/12/2023  ID:  Patty [name], DOB 06/20/1970, MRN [id number] PCP: Kennyth Worth CHRISTELLA, MD  [facility] Providers Cardiologist:  Stanly DELENA Leavens, MD   History of Present Illness Patty [name] is a 53 y.o. female with a past medical history of palpitations/PACs, HLD, statin intolerance. Patient is followed by Dr. Leavens and presents today for an annual follow up appointment   Patient previously underwent echocardiogram 07/16/20 that showed EF 55-60%, no regional wall motion abnormalities, normal LV diastolic parameters, normal RV systolic function, normal PA systolic pressure, no significant valvular abnormalities. CT cardiac scoring 07/21/20 showed a coronary calcium  score of 0. Cardiac monitor in 08/2020 showed sinus rhythm with HR ranging from 44-182 BPM, 2 runs of SVT lasting 6 beats.   Stress echo from 06/2022 showed no regional wall motion abnormalities, no stress-induced wall motion abnormalities   Patient was last seen by Dr. Leavens 11/07/22. At that time, patient was doing well. No chest pain or pressure. No shortness of breath. Was able to tolerate pravastatin  10 mg every other day. Did not need AV nodal agents for palpitations   Today, patient reports that she has been doing very well from a cardiovascular standpoint.  She runs marathons and has been able to run without issue.  Denies chest pain or shortness of breath.  Denies palpitations.  Denies dizziness, syncope, near syncope.  She is tolerating pravastatin  well.  She has not had blood work in over a year, today will check lipid panel, CMP, CBC.   Studies Reviewed  Cardiac Studies & Procedures   ______________________________________________________________________________________________   STRESS TESTS  ECHOCARDIOGRAM STRESS TEST 06/28/2022  Narrative EXERCISE STRESS ECHO  REPORT   --------------------------------------------------------------------------------  Patient Name:   Patty [name] Date of Exam: 06/28/2022 Medical Rec #:  [id number]           Height:       67.5 in Accession #:    [id number]          Weight:       123.0 lb Date of Birth:  06-14-1970           BSA:          1.654 m Patient Age:    51 years            BP:           118/78 mmHg Patient Gender: F                   HR:           64 bpm. Exam Location:  [address]  Procedure: Limited Echo, Cardiac Doppler, Limited Color Doppler and Intracardiac Opacification Agent  Indications:    R07.9 Chest Pain  History:        Patient has prior history of Echocardiogram examinations, most recent 07/16/2020.  Sonographer:    Waldo Guadalajara RCS Referring Phys: [id number] MAHESH A CHANDRASEKHAR  IMPRESSIONS   1. This is a negative stress echocardiogram for ischemia. 2. This is a low risk study.  FINDINGS  Exam Protocol: The patient exercised on a treadmill according to a Bruce protocol.   Patient Performance: The patient exercised for 14 minutes and 0 seconds, achieving 17.2 METS. The maximum stage achieved was V of the Bruce protocol. The heart rate at peak stress was 171 bpm. The target heart rate was calculated to be 142 bpm. The percentage of maximum predicted heart rate achieved was 102.3 %.  The baseline blood pressure was 118/78 mmHg. The blood pressure at peak stress was 153/71 mmHg. The blood pressure response was normal. The patient developed fatigue during the stress exam. The symptoms resolved with rest.  EKG: Resting EKG showed normal sinus rhythm. The patient developed no abnormal EKG findings during exercise.   2D Echo Findings: Baseline regional wall motion abnormalities were not present. There were no stress-induced wall motion abnormalities. This is a negative stress echocardiogram for ischemia.   Kardie Tobb DO Electronically signed on 06/28/2022 at 4:13:23  PM      Final   ECHOCARDIOGRAM  ECHOCARDIOGRAM COMPLETE 07/16/2020  Narrative ECHOCARDIOGRAM REPORT    Patient Name:   Patty [name] Date of Exam: 07/16/2020 Medical Rec #:  [id number]           Height:       67.0 in Accession #:    [id number]          Weight:       123.0 lb Date of Birth:  10-17-1970           BSA:          1.645 m Patient Age:    49 years            BP:           110/56 mmHg Patient Gender: F                   HR:           56 bpm. Exam Location:  Outpatient  Procedure: 2D Echo, Cardiac Doppler and Color Doppler  Indications:    Murmur 785.2 / R01.1  History:        Patient has no prior history of Echocardiogram examinations. Risk Factors:Non-Smoker and Dyslipidemia. Palpitations.  Sonographer:    Recardo Hedge RDCS Referring Phys: [id number] [facility] A CHANDRASEKHAR  IMPRESSIONS   1. Left ventricular ejection fraction, by estimation, is 55 to 60%. The left ventricle has normal function. The left ventricle has no regional wall motion abnormalities. Left ventricular diastolic parameters were normal. 2. Right ventricular systolic function is normal. The right ventricular size is normal. There is normal pulmonary artery systolic pressure. 3. The mitral valve is normal in structure. Trivial mitral valve regurgitation. No evidence of mitral stenosis. 4. The aortic valve is normal in structure. Aortic valve regurgitation is not visualized. No aortic stenosis is present. 5. The inferior vena cava is normal in size with greater than 50% respiratory variability, suggesting right atrial pressure of 3 mmHg.  FINDINGS Left Ventricle: Left ventricular ejection fraction, by estimation, is 55 to 60%. The left ventricle has normal function. The left ventricle has no regional wall motion abnormalities. The left ventricular internal cavity size was normal in size. There is no left ventricular hypertrophy. Left ventricular diastolic parameters were normal.  Right  Ventricle: The right ventricular size is normal. No increase in right ventricular wall thickness. Right ventricular systolic function is normal. There is normal pulmonary artery systolic pressure. The tricuspid regurgitant velocity is 2.06 m/s, and with an assumed right atrial pressure of 3 mmHg, the estimated right ventricular systolic pressure is 20.0 mmHg.  Left Atrium: Left atrial size was normal in size.  Right Atrium: Right atrial size was normal in size.  Pericardium: There is no evidence of pericardial effusion.  Mitral Valve: The mitral valve is normal in structure. Trivial mitral valve regurgitation. No evidence of mitral valve stenosis.  Tricuspid Valve: The tricuspid valve is normal  in structure. Tricuspid valve regurgitation is trivial. No evidence of tricuspid stenosis.  Aortic Valve: The aortic valve is normal in structure. Aortic valve regurgitation is not visualized. No aortic stenosis is present.  Pulmonic Valve: The pulmonic valve was normal in structure. Pulmonic valve regurgitation is not visualized. No evidence of pulmonic stenosis.  Aorta: The aortic root is normal in size and structure.  Venous: The inferior vena cava is normal in size with greater than 50% respiratory variability, suggesting right atrial pressure of 3 mmHg.  IAS/Shunts: No atrial level shunt detected by color flow Doppler.   LEFT VENTRICLE PLAX 2D LVIDd:         4.60 cm      Diastology LVIDs:         3.40 cm      LV e' medial:    8.70 cm/s LV PW:         0.60 cm      LV E/e' medial:  7.7 LV IVS:        0.60 cm      LV e' lateral:   10.40 cm/s LVOT diam:     2.10 cm      LV E/e' lateral: 6.5 LV SV:         72 LV SV Index:   44 LVOT Area:     3.46 cm  LV Volumes (MOD) LV vol d, MOD A2C: 110.0 ml LV vol d, MOD A4C: 112.0 ml LV vol s, MOD A2C: 45.3 ml LV vol s, MOD A4C: 51.6 ml LV SV MOD A2C:     64.7 ml LV SV MOD A4C:     112.0 ml LV SV MOD BP:      64.2 ml  RIGHT VENTRICLE RV S  prime:     13.30 cm/s TAPSE (M-mode): 2.7 cm  LEFT ATRIUM             Index       RIGHT ATRIUM           Index LA diam:        2.70 cm 1.64 cm/m  RA Area:     10.60 cm LA Vol (A2C):   33.8 ml 20.55 ml/m RA Volume:   21.60 ml  13.13 ml/m LA Vol (A4C):   19.6 ml 11.92 ml/m LA Biplane Vol: 27.8 ml 16.90 ml/m AORTIC VALVE LVOT Vmax:   91.40 cm/s LVOT Vmean:  62.300 cm/s LVOT VTI:    0.207 m  AORTA Ao Root diam: 2.70 cm  MITRAL VALVE               TRICUSPID VALVE MV Area (PHT): 3.60 cm    TR Peak grad:   17.0 mmHg MV Decel Time: 211 msec    TR Vmax:        206.00 cm/s MV E velocity: 67.30 cm/s MV A velocity: 59.60 cm/s  SHUNTS MV E/A ratio:  1.13        Systemic VTI:  0.21 m Systemic Diam: 2.10 cm  Mihai Croitoru MD Electronically signed by Jerel Balding MD Signature Date/Time: 07/16/2020/10:46:23 AM    Final    MONITORS  LONG TERM MONITOR (3-14 DAYS) 08/04/2020  Narrative  Patient had a minimum heart rate of 44 bpm, maximum heart rate of 182 bpm, and average heart rate of 70 bpm.  Predominant underlying rhythm was sinus rhythm.  Two runs of supraventricular tachycardia occurred lasting 6 beats at longest with a max rate of 136 bpm at fastest.  Isolated PACs were rare (<1.0%), with rare couplets and triplets present.  Isolated PVCs were rare (<1.0%), with rare couplets present.  No evidence of complete heart block.  Triggered and diary events associated with sinus rhythm (algorithrm also reads for SVE but there is no post extra-systolic pause).  No malignant arrhythmias.   CT SCANS  CT CARDIAC SCORING (SELF PAY ONLY) 07/21/2020  Addendum 07/21/2020  5:39 PM ADDENDUM REPORT: 07/21/2020 17:36  CLINICAL DATA:  Risk stratification: 53 Year-old Hispanic Female  EXAM: Coronary Calcium  Score  TECHNIQUE: The patient was scanned on a [employer]. Axial non-contrast 3 mm slices were carried out through the heart. The data set was analyzed on a  dedicated work station and scored using the Agatson method.  FINDINGS: Non-cardiac: See separate report from [facility].  Ascending Aorta: Normal caliber.  Pericardium: Normal.  Coronary arteries: Normal origins.  Coronary calcium  score of 0. This was 1st percentile for age, gender, and race matched controls.  IMPRESSION: 1. Coronary calcium  score of 0. This was 1st percentile for age, gender, and race matched controls.  Stanly Leavens, MD   Electronically Signed By: Stanly Leavens MD On: 07/21/2020 17:36  Narrative EXAM: OVER-READ INTERPRETATION  CT CHEST  The following report is an over-read performed by radiologist Dr. Toribio Aye of [facility] on 07/21/2020. This over-read does not include interpretation of cardiac or coronary anatomy or pathology. The coronary calcium  score interpretation by the cardiologist is attached.  COMPARISON:  None.  FINDINGS: Within the visualized portions of the thorax there are no suspicious appearing pulmonary nodules or masses, there is no acute consolidative airspace disease, no pleural effusions, no pneumothorax and no lymphadenopathy. Visualized portions of the upper abdomen are unremarkable. There are no aggressive appearing lytic or blastic lesions noted in the visualized portions of the skeleton.  IMPRESSION: No significant incidental noncardiac findings are noted.  Electronically Signed: By: Toribio Aye M.D. On: 07/21/2020 13:29     ______________________________________________________________________________________________       Risk Assessment/Calculations           Physical Exam VS:  BP 110/70   Pulse (!) 59   Ht 5' 8 (1.727 m)   Wt 125 lb 6.4 oz (56.9 kg)   SpO2 97%   BMI 19.07 kg/m        Wt Readings from Last 3 Encounters:  12/12/23 125 lb 6.4 oz (56.9 kg)  11/28/22 123 lb (55.8 kg)  11/07/22 125 lb 3.2 oz (56.8 kg)    GEN: Well nourished,  well developed in no acute distress. Sitting comfortably on the exam table  NECK: No JVD  CARDIAC:  RRR, no murmurs, rubs, gallops. Radial pulses 2+ bilaterally  RESPIRATORY:  Clear to auscultation without rales, wheezing or rhonchi. Normal WOB on room air   ABDOMEN: Soft, non-tender, non-distended EXTREMITIES:  No edema in BLE; No deformity   ASSESSMENT AND PLAN  HLD  Statin Intolerance  - Currently tolerating pravastatin  well.  Her LDL was 93 in 02/2023 Ordered lipid panel today for monitoring.  Also ordered CMP to monitor liver function - Continue pravastatin  10 mg every other day   PACs  Palpitations  - Cardiac monitoring 08/2020 showed minimum heart rate 44 bpm, max heart rate 182 beats minute, average heart rate 70 bpm.  Rare PVCs and PACs, 2 short runs of SVT, longest lasting 6 beats - Patient reports improvement in palpitations overall.  Has not been on AV nodal medications in the past with  low resting heart - As palpitations have improved, medication adjustments needed at this time - Ordered CMP, CBC  Chest pain - Patient was seen for this in the past.  Chest pain has since resolved - Stress echo in 06/2022 was a low risk study, negative for ischemia - Patient stays active by running marathons.  No chest pain or dyspnea on exertion.  No indication for further workup at this time  Dispo: Follow up in 12 months with Dr. Santo   Signed, Rollo FABIENE Louder, PA-C

## 2023-12-12 ENCOUNTER — Ambulatory Visit: Attending: Cardiovascular Disease | Admitting: Cardiology

## 2023-12-12 ENCOUNTER — Encounter: Payer: Self-pay | Admitting: Cardiology

## 2023-12-12 VITALS — BP 110/70 | HR 59 | Ht 68.0 in | Wt 125.4 lb

## 2023-12-12 DIAGNOSIS — I491 Atrial premature depolarization: Secondary | ICD-10-CM

## 2023-12-12 DIAGNOSIS — Z789 Other specified health status: Secondary | ICD-10-CM | POA: Diagnosis not present

## 2023-12-12 DIAGNOSIS — E782 Mixed hyperlipidemia: Secondary | ICD-10-CM | POA: Diagnosis not present

## 2023-12-12 DIAGNOSIS — R079 Chest pain, unspecified: Secondary | ICD-10-CM

## 2023-12-12 DIAGNOSIS — R002 Palpitations: Secondary | ICD-10-CM | POA: Diagnosis not present

## 2023-12-12 LAB — LIPID PANEL

## 2023-12-12 NOTE — Patient Instructions (Signed)
 Medication Instructions:  No changes *If you need a refill on your cardiac medications before your next appointment, please call your pharmacy*  Lab Work: Today we are going to draw a lipid panel CBC, and Cmet If you have labs (blood work) drawn today and your tests are completely normal, you will receive your results only by: MyChart Message (if you have MyChart) OR A paper copy in the mail If you have any lab test that is abnormal or we need to change your treatment, we will call you to review the results.  Testing/Procedures: No testing  Follow-Up: At [facility], you and your health needs are our priority.  As part of our continuing mission to provide you with exceptional heart care, our providers are all part of one team.  This team includes your primary Cardiologist (physician) and Advanced Practice Providers or APPs (Physician Assistants and Nurse Practitioners) who all work together to provide you with the care you need, when you need it.  Your next appointment:   12 month(s)  Provider:   Stanly DELENA Leavens, MD    We recommend signing up for the patient portal called MyChart.  Sign up information is provided on this After Visit Summary.  MyChart is used to connect with patients for Virtual Visits (Telemedicine).  Patients are able to view lab/test results, encounter notes, upcoming appointments, etc.  Non-urgent messages can be sent to your provider as well.   To learn more about what you can do with MyChart, go to [email or web address].

## 2023-12-13 ENCOUNTER — Ambulatory Visit: Payer: Self-pay | Admitting: Cardiology

## 2023-12-13 LAB — LIPID PANEL
Cholesterol, Total: 201 mg/dL — AB (ref 100–199)
HDL: 76 mg/dL (ref >39)
LDL CALC COMMENT:: 2.6 ratio (ref 0.0–4.4)
LDL Chol Calc (NIH): 107 mg/dL — AB (ref 0–99)
Triglycerides: 103 mg/dL (ref 0–149)
VLDL Cholesterol Cal: 18 mg/dL (ref 5–40)

## 2023-12-13 LAB — COMPREHENSIVE METABOLIC PANEL WITH GFR
ALT: 21 IU/L (ref 0–32)
AST: 24 IU/L (ref 0–40)
Albumin: 4.6 g/dL (ref 3.8–4.9)
Alkaline Phosphatase: 82 IU/L (ref 44–121)
BUN/Creatinine Ratio: 17 (ref 9–23)
BUN: 14 mg/dL (ref 6–24)
Bilirubin Total: 0.8 mg/dL (ref 0.0–1.2)
CO2: 23 mmol/L (ref 20–29)
Calcium: 9.6 mg/dL (ref 8.7–10.2)
Chloride: 102 mmol/L (ref 96–106)
Creatinine, Ser: 0.83 mg/dL (ref 0.57–1.00)
Globulin, Total: 2.3 g/dL (ref 1.5–4.5)
Glucose: 93 mg/dL (ref 70–99)
Potassium: 4.6 mmol/L (ref 3.5–5.2)
Sodium: 139 mmol/L (ref 134–144)
Total Protein: 6.9 g/dL (ref 6.0–8.5)
eGFR: 84 mL/min/1.73 (ref >59)

## 2023-12-13 LAB — CBC
Hematocrit: 39 % (ref 34.0–46.6)
Hemoglobin: 12.4 g/dL (ref 11.1–15.9)
MCH: 30.3 pg (ref 26.6–33.0)
MCHC: 31.8 g/dL (ref 31.5–35.7)
MCV: 95 fL (ref 79–97)
Platelets: 304 x10E3/uL (ref 150–450)
RBC: 4.09 x10E6/uL (ref 3.77–5.28)
RDW: 12.5 % (ref 11.7–15.4)
WBC: 6 x10E3/uL (ref 3.4–10.8)

## 2024-02-05 ENCOUNTER — Encounter: Payer: Self-pay | Admitting: Sports Medicine

## 2024-04-11 ENCOUNTER — Telehealth: Payer: Self-pay

## 2024-04-11 NOTE — Telephone Encounter (Signed)
 Copied from CRM #[id number]. Topic: Clinical - Request for Lab/Test Order >> Apr 11, 2024 12:10 PM Harlene ORN wrote: Reason for CRM: Patient called. Has an appointment scheduled for 11/13 @ 7:40 am. Would like to have fasting lab orders activated for her appointment.

## 2024-04-14 NOTE — Telephone Encounter (Signed)
 Ok with me. Please place any necessary orders.

## 2024-04-17 ENCOUNTER — Ambulatory Visit (INDEPENDENT_AMBULATORY_CARE_PROVIDER_SITE_OTHER): Admitting: Family Medicine

## 2024-04-17 ENCOUNTER — Encounter: Payer: Self-pay | Admitting: Family Medicine

## 2024-04-17 VITALS — BP 100/60 | HR 58 | Temp 97.5°F | Ht 68.0 in | Wt 125.0 lb

## 2024-04-17 DIAGNOSIS — Z0001 Encounter for general adult medical examination with abnormal findings: Secondary | ICD-10-CM

## 2024-04-17 DIAGNOSIS — M2550 Pain in unspecified joint: Secondary | ICD-10-CM

## 2024-04-17 DIAGNOSIS — Z131 Encounter for screening for diabetes mellitus: Secondary | ICD-10-CM

## 2024-04-17 DIAGNOSIS — E782 Mixed hyperlipidemia: Secondary | ICD-10-CM

## 2024-04-17 DIAGNOSIS — M940 Chondrocostal junction syndrome [Tietze]: Secondary | ICD-10-CM

## 2024-04-17 DIAGNOSIS — E2839 Other primary ovarian failure: Secondary | ICD-10-CM | POA: Diagnosis not present

## 2024-04-17 LAB — CBC
HCT: 37.9 % (ref 36.0–46.0)
Hemoglobin: 12.8 g/dL (ref 12.0–15.0)
MCHC: 33.7 g/dL (ref 30.0–36.0)
MCV: 91.9 fl (ref 78.0–100.0)
Platelets: 268 K/uL (ref 150.0–400.0)
RBC: 4.13 Mil/uL (ref 3.87–5.11)
RDW: 13.5 % (ref 11.5–15.5)
WBC: 4.3 K/uL (ref 4.0–10.5)

## 2024-04-17 LAB — COMPREHENSIVE METABOLIC PANEL WITH GFR
ALT: 18 U/L (ref 0–35)
AST: 22 U/L (ref 0–37)
Albumin: 4.5 g/dL (ref 3.5–5.2)
Alkaline Phosphatase: 59 U/L (ref 39–117)
BUN: 17 mg/dL (ref 6–23)
CO2: 30 meq/L (ref 19–32)
Calcium: 9.2 mg/dL (ref 8.4–10.5)
Chloride: 102 meq/L (ref 96–112)
Creatinine, Ser: 0.75 mg/dL (ref 0.40–1.20)
GFR: 90.11 mL/min (ref >60.00)
Glucose, Bld: 83 mg/dL (ref 70–99)
Potassium: 3.9 meq/L (ref 3.5–5.1)
Sodium: 141 meq/L (ref 135–145)
Total Bilirubin: 0.8 mg/dL (ref 0.2–1.2)
Total Protein: 7.2 g/dL (ref 6.0–8.3)

## 2024-04-17 LAB — MAGNESIUM: Magnesium: 2 mg/dL (ref 1.5–2.5)

## 2024-04-17 LAB — LIPID PANEL
Cholesterol: 185 mg/dL (ref 0–200)
HDL: 81.1 mg/dL (ref >39.00)
LDL Cholesterol: 91 mg/dL (ref 0–99)
NonHDL: 104.05
Total CHOL/HDL Ratio: 2
Triglycerides: 64 mg/dL (ref 0.0–149.0)
VLDL: 12.8 mg/dL (ref 0.0–40.0)

## 2024-04-17 LAB — TSH: TSH: 1.56 u[IU]/mL (ref 0.35–5.50)

## 2024-04-17 LAB — VITAMIN B12: Vitamin B-12: 548 pg/mL (ref 211–911)

## 2024-04-17 LAB — VITAMIN D 25 HYDROXY (VIT D DEFICIENCY, FRACTURES): VITD: 56.61 ng/mL (ref 30.00–100.00)

## 2024-04-17 LAB — HEMOGLOBIN A1C: Hgb A1c MFr Bld: 6 % (ref 4.6–6.5)

## 2024-04-17 NOTE — Assessment & Plan Note (Signed)
 Patient with persistent pain in her right knee secondary to osteoarthritis though  has also been told she has a meniscal tear.  She is currently getting PRP and hyaluronic acid injections.  Symptoms are currently manageable.

## 2024-04-17 NOTE — Assessment & Plan Note (Signed)
 Overall symptoms are stable.  Uses ibuprofen  as needed.  We discussed reasons to return to care.

## 2024-04-17 NOTE — Assessment & Plan Note (Addendum)
 She is on pravastatin  10 mg daily per cardiology.  Tolerating this well.  Will check labs today.  She is doing excellent job with lifestyle modifications.  If LDL is elevated she would like to increase her pravastatin  to 15 mg daily.

## 2024-04-17 NOTE — Patient Instructions (Signed)
 It was very nice to see you today!  VISIT SUMMARY: Today, you had your annual physical exam and blood work. We discussed your current health status, including your knee condition, cholesterol management, and occasional chest pain. We also talked about your reactions to vaccines and planned future tests and scans.  YOUR PLAN: ADULT WELLNESS VISIT: This visit focused on preventive care and included a discussion on vaccines due to previous adverse reactions. -Comprehensive blood work was ordered, including CBC, electrolytes, liver and kidney function, thyroid  function, cholesterol, A1c, vitamin D , B12, and lipoprotein A. -A bone density scan was ordered to assess your bone health. -Continue a healthy diet and regular exercise. -Consider the timing and potential side effects of pneumonia and shingles vaccines.  MIXED HYPERLIPIDEMIA: You have high cholesterol, which is being managed with Pravachol  10 mg daily. -Cholesterol levels were checked with your blood work. -If cholesterol levels remain high, consider increasing Pravachol  to 20 mg or taking 1.5 tablets of 10 mg.  OSTEOARTHRITIS OF KNEE WITH MENISCAL TEAR: Your chronic knee condition is managed with PRP and hyaluronic acid injections. -Continue PRP and hyaluronic acid injections as needed. -Encourage physical activity as tolerated.  COSTOCHONDRITIS: You experience intermittent chest pain due to costochondritis, which resolves with ibuprofen . -Continue taking ibuprofen  as needed for pain management. -Monitor for any changes in pain pattern, especially during exertion.  Return in about 1 year (around 04/17/2025).   Take care, Dr Kennyth  PLEASE NOTE:  If you had any lab tests, please let us  know if you have not heard back within a few days. You may see your results on mychart before we have a chance to review them but we will give you a call once they are reviewed by us .   If we ordered any referrals today, please let us  know if you  have not heard from their office within the next week.   If you had any urgent prescriptions sent in today, please check with the pharmacy within an hour of our visit to make sure the prescription was transmitted appropriately.   Please try these tips to maintain a healthy lifestyle:  Eat at least 3 REAL meals and 1-2 snacks per day.  Aim for no more than 5 hours between eating.  If you eat breakfast, please do so within one hour of getting up.   Each meal should contain half fruits/vegetables, one quarter protein, and one quarter carbs (no bigger than a computer mouse)  Cut down on sweet beverages. This includes juice, soda, and sweet tea.   Drink at least 1 glass of water with each meal and aim for at least 8 glasses per day  Exercise at least 150 minutes every week.     Preventive Care 70-1 Years Old, Female Preventive care refers to lifestyle choices and visits with your health care provider that can promote health and wellness. Preventive care visits are also called wellness exams. What can I expect for my preventive care visit? Counseling Your health care provider may ask you questions about your: Medical history, including: Past medical problems. Family medical history. Pregnancy history. Current health, including: Menstrual cycle. Method of birth control. Emotional well-being. Home life and relationship well-being. Sexual activity and sexual health. Lifestyle, including: Alcohol, nicotine or tobacco, and drug use. Access to firearms. Diet, exercise, and sleep habits. Work and work astronomer. Sunscreen use. Safety issues such as seatbelt and bike helmet use. Physical exam Your health care provider will check your: Height and weight. These may be used  to calculate your BMI (body mass index). BMI is a measurement that tells if you are at a healthy weight. Waist circumference. This measures the distance around your waistline. This measurement also tells if you are at  a healthy weight and may help predict your risk of certain diseases, such as type 2 diabetes and high blood pressure. Heart rate and blood pressure. Body temperature. Skin for abnormal spots. What immunizations do I need?  Vaccines are usually given at various ages, according to a schedule. Your health care provider will recommend vaccines for you based on your age, medical history, and lifestyle or other factors, such as travel or where you work. What tests do I need? Screening Your health care provider may recommend screening tests for certain conditions. This may include: Lipid and cholesterol levels. Diabetes screening. This is done by checking your blood sugar (glucose) after you have not eaten for a while (fasting). Pelvic exam and Pap test. Hepatitis B test. Hepatitis C test. HIV (human immunodeficiency virus) test. STI (sexually transmitted infection) testing, if you are at risk. Lung cancer screening. Colorectal cancer screening. Mammogram. Talk with your health care provider about when you should start having regular mammograms. This may depend on whether you have a family history of breast cancer. BRCA-related cancer screening. This may be done if you have a family history of breast, ovarian, tubal, or peritoneal cancers. Bone density scan. This is done to screen for osteoporosis. Talk with your health care provider about your test results, treatment options, and if necessary, the need for more tests. Follow these instructions at home: Eating and drinking  Eat a diet that includes fresh fruits and vegetables, whole grains, lean protein, and low-fat dairy products. Take vitamin and mineral supplements as recommended by your health care provider. Do not drink alcohol if: Your health care provider tells you not to drink. You are pregnant, may be pregnant, or are planning to become pregnant. If you drink alcohol: Limit how much you have to 0-1 drink a day. Know how much  alcohol is in your drink. In the [place] one drink equals one 12 oz bottle of beer (355 mL), one 5 oz glass of wine (148 mL), or one 1 oz glass of hard liquor (44 mL). Lifestyle Brush your teeth every morning and night with fluoride toothpaste. Floss one time each day. Exercise for at least 30 minutes 5 or more days each week. Do not use any products that contain nicotine or tobacco. These products include cigarettes, chewing tobacco, and vaping devices, such as e-cigarettes. If you need help quitting, ask your health care provider. Do not use drugs. If you are sexually active, practice safe sex. Use a condom or other form of protection to prevent STIs. If you do not wish to become pregnant, use a form of birth control. If you plan to become pregnant, see your health care provider for a prepregnancy visit. Take aspirin only as told by your health care provider. Make sure that you understand how much to take and what form to take. Work with your health care provider to find out whether it is safe and beneficial for you to take aspirin daily. Find healthy ways to manage stress, such as: Meditation, yoga, or listening to music. Journaling. Talking to a trusted person. Spending time with friends and family. Minimize exposure to UV radiation to reduce your risk of skin cancer. Safety Always wear your seat belt while driving or riding in a vehicle. Do not drive: If you  have been drinking alcohol. Do not ride with someone who has been drinking. When you are tired or distracted. While texting. If you have been using any mind-altering substances or drugs. Wear a helmet and other protective equipment during sports activities. If you have firearms in your house, make sure you follow all gun safety procedures. Seek help if you have been physically or sexually abused. What's next? Visit your health care provider once a year for an annual wellness visit. Ask your health care provider how often you should  have your eyes and teeth checked. Stay up to date on all vaccines. This information is not intended to replace advice given to you by your health care provider. Make sure you discuss any questions you have with your health care provider. Document Revised: 11/17/2020 Document Reviewed: 11/17/2020 Elsevier Patient Education  2024 [employer].

## 2024-04-17 NOTE — Progress Notes (Signed)
 Chief Complaint:  Patty [name] is a 53 y.o. female who presents today for her annual comprehensive physical exam.    Assessment/Plan:   Chronic Problems Addressed Today: Mixed hyperlipidemia She is on pravastatin  10 mg daily per cardiology.  Tolerating this well.  Will check labs today.  She is doing excellent job with lifestyle modifications.  If LDL is elevated she would like to increase her pravastatin  to 15 mg daily.  Polyarthralgia Patient with persistent pain in her right knee secondary to osteoarthritis though  has also been told she has a meniscal tear.  She is currently getting PRP and hyaluronic acid injections.  Symptoms are currently manageable.  Costochondritis Overall symptoms are stable.  Uses ibuprofen  as needed.  We discussed reasons to return to care.  Preventative Healthcare: Check labs.  Up-to-date on flu shot.  She is eligible for shingles and pneumonia vaccine however declined for today.  She will let us  know if she would like  to have this done here or she can go to the pharmacy.  Patient Counseling(The following topics were reviewed and/or handout was given):  -Nutrition: Stressed importance of moderation in sodium/caffeine intake, saturated fat and cholesterol, caloric balance, sufficient intake of fresh fruits, vegetables, and fiber.  -Stressed the importance of regular exercise.   -Substance Abuse: Discussed cessation/primary prevention of tobacco, alcohol, or other drug use; driving or other dangerous activities under the influence; availability of treatment for abuse.   -Injury prevention: Discussed safety belts, safety helmets, smoke detector, smoking near bedding or upholstery.   -Sexuality: Discussed sexually transmitted diseases, partner selection, use of condoms, avoidance of unintended pregnancy and contraceptive alternatives.   -Dental health: Discussed importance of regular tooth brushing, flossing, and dental visits.  -Health maintenance and  immunizations reviewed. Please refer to Health maintenance section.  Return to care in 1 year for next preventative visit.     Subjective:  HPI:  She has no acute complaints today. Patient is here today for her annual physical.  See assessment / plan for status of chronic conditions.  Discussed the use of AI scribe software for clinical note transcription with the patient, who gave verbal consent to proceed.  History of Present Illness Patty [name] is a 53 year old female who presents for an annual physical exam and blood work.  She has a history of a torn meniscus and arthritis, managed with PRP and hyaluronic acid injections. Despite recommendations for surgery, she has opted against it due to concerns about her ability to run post-surgery. She continues to run and recently completed a half marathon without issues.  She takes Pravachol  10 mg daily for cholesterol management. Previously, she took it every other day, but due to high cholesterol levels, she switched to daily dosing since July. She tolerates the medication well and is interested in checking her cholesterol levels again. Her family history includes high cholesterol affecting her father, brother, and mother.  She takes daily calcium  and vitamin D  supplements and is interested in checking her levels with her blood work. She has requested a bone density scan to assess her bone health.  She experiences palpitations following flu and COVID vaccinations, which she attributes to an inflammatory response. She has decided to delay the pneumonia and shingles vaccines due to these reactions.      04/17/2024    7:44 AM  Depression screen PHQ 2/9  Decreased Interest 0  Down, Depressed, Hopeless 0  PHQ - 2 Score 0    Health Maintenance  Due  Topic Date Due   Cervical Cancer Screening (HPV/Pap Cotest)  01/24/2018   Mammogram  09/05/2018     ROS: Per HPI, otherwise a complete review of systems was negative.   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   Sleep-disordered breathing 07/01/2021   Statin intolerance 05/23/2021   Costochondritis 01/26/2021   Polyarthralgia 01/26/2021   PAC (premature atrial contraction) 08/12/2020   Mixed hyperlipidemia 07/07/2020   Primary osteoarthritis of both knees 08/21/2017   History of tibial stress fracture 08/16/2015   Past Surgical History:  Procedure Laterality Date   COLONOSCOPY  15 years ago    in CT-normal   ENDOMETRIAL ABLATION  10/16/2013   SEPTOPLASTY     1998 and 2000   TURBINATE REDUCTION  08/2021    Family History  Problem Relation Age of Onset   Hypertension Father    Heart attack Maternal Grandmother    Heart attack Maternal Grandfather    High Cholesterol Other    Colon cancer Neg Hx    Colon polyps Neg Hx    Esophageal cancer Neg Hx    Rectal cancer Neg Hx    Stomach cancer Neg Hx    Sleep apnea Neg Hx     Medications- reviewed and updated Current Outpatient Medications  Medication Sig Dispense Refill   Multiple Vitamin (MULTIVITAMIN) tablet Take 1 tablet by mouth daily.      pravastatin  (PRAVACHOL ) 10 MG tablet TAKE 1 TABLET BY MOUTH DAILY 102 tablet 2   Calcium  Citrate-Vitamin D  250-5 MG-MCG TABS Take by mouth.     No current facility-administered medications for this visit.    Allergies-reviewed and updated Allergies  Allergen Reactions   Rosuvastatin  Calcium  Other (See Comments)    Joint pain   Atorvastatin  Other (See Comments)    Joint pain    Social History   Socioeconomic History   Marital status: Married    Spouse name: Not on file   Number of children: 2   Years of education: Not on file   Highest education level: Master's degree  (e.g., MA, MS, MEng, MEd, MSW, MBA)  Occupational History   Occupation: Scientist, [employer]  Tobacco Use   Smoking status: Never   Smokeless tobacco: Never  Vaping Use   Vaping status: Never Used  Substance and Sexual Activity   Alcohol use: Never    Comment: Social  beer/wine   Drug use: Never   Sexual activity: Yes    Partners: Male    Birth control/protection: None  Other Topics Concern   Not on file  Social History Narrative   Lives at home with husband and 2 children   Caffeine: 2 cups/day   Social Drivers of Corporate Investment Banker Strain: Low Risk  (11/28/2022)   Overall Financial Resource Strain (CARDIA)    Difficulty of Paying Living Expenses: Not hard at all  Food Insecurity: No Food Insecurity (11/28/2022)   [name] Vital Sign    Worried About Running Out of Food in the Last Year: Never true    Ran Out of Food in the Last Year: Never true  Transportation Needs: No Transportation Needs (11/28/2022)   PRAPARE - Transportation  Lack of Transportation (Medical): No    Lack of Transportation (Non-Medical): No  Physical Activity: Sufficiently Active (11/28/2022)   Exercise Vital Sign    Days of Exercise per Week: 7 days    Minutes of Exercise per Session: 40 min  Stress: No Stress Concern Present (11/28/2022)   [employer] of Occupational Health - Occupational Stress Questionnaire    Feeling of Stress : Not at all  Social Connections: Socially Integrated (11/28/2022)   Social Connection and Isolation Panel    Frequency of Communication with Friends and Family: Twice a week    Frequency of Social Gatherings with Friends and Family: Once a week    Attends Religious Services: 1 to 4 times per year    Active Member of [employer] or Organizations: Yes    Attends Banker Meetings: 1 to 4 times per year    Marital Status: Married        Objective:  Physical Exam: BP 100/60   Pulse (!) 58   Temp (!) 97.5 F (36.4 C) (Temporal)   Ht 5' 8 (1.727 m)   Wt  125 lb (56.7 kg)   SpO2 98%   BMI 19.01 kg/m   Body mass index is 19.01 kg/m. Wt Readings from Last 3 Encounters:  04/17/24 125 lb (56.7 kg)  12/12/23 125 lb 6.4 oz (56.9 kg)  11/28/22 123 lb (55.8 kg)   Gen: NAD, resting comfortably HEENT: TMs normal bilaterally. OP clear. No thyromegaly noted.  CV: RRR with no murmurs appreciated Pulm: NWOB, CTAB with no crackles, wheezes, or rhonchi GI: Normal bowel sounds present. Soft, Nontender, Nondistended. MSK: no edema, cyanosis, or clubbing noted Skin: warm, dry Neuro: CN2-12 grossly intact. Strength 5/5 in upper and lower extremities. Reflexes symmetric and intact bilaterally.  Psych: Normal affect and thought content     Wrenley Sayed M. Kennyth, MD 04/17/2024 8:34 AM

## 2024-04-22 LAB — LIPOPROTEIN A (LPA): Lipoprotein (a): 206 nmol/L — ABNORMAL HIGH (ref <75)

## 2024-04-24 ENCOUNTER — Ambulatory Visit: Payer: Self-pay | Admitting: Family Medicine

## 2024-04-24 NOTE — Progress Notes (Signed)
 Her lipoprotein a is elevated however her lipid panel is at goal.  She should continue with cholesterol management per cardiology.  A1c is stable at 6.0.  The rest of her labs are all at goal.  Do not need to make any changes to her treatment plan at this time.  She should keep up the great work with diet and exercise and we can recheck everything in a year or so.

## 2024-05-05 DIAGNOSIS — Z1231 Encounter for screening mammogram for malignant neoplasm of breast: Secondary | ICD-10-CM | POA: Diagnosis not present

## 2024-06-16 ENCOUNTER — Ambulatory Visit: Admitting: Physician Assistant

## 2024-07-24 ENCOUNTER — Ambulatory Visit: Admitting: Internal Medicine

## 2024-08-06 ENCOUNTER — Other Ambulatory Visit

## 2025-04-20 ENCOUNTER — Encounter: Admitting: Family Medicine
